# Patient Record
Sex: Female | Born: 1945
Health system: Southern US, Community
[De-identification: ages and names within clinical notes are randomized; demographics above are authoritative.]

## PROBLEM LIST (undated history)

## (undated) DIAGNOSIS — M810 Age-related osteoporosis without current pathological fracture: Secondary | ICD-10-CM

## (undated) DIAGNOSIS — Z8489 Family history of other specified conditions: Secondary | ICD-10-CM

## (undated) DIAGNOSIS — T7840XA Allergy, unspecified, initial encounter: Secondary | ICD-10-CM

## (undated) DIAGNOSIS — K219 Gastro-esophageal reflux disease without esophagitis: Secondary | ICD-10-CM

## (undated) DIAGNOSIS — G4700 Insomnia, unspecified: Secondary | ICD-10-CM

## (undated) DIAGNOSIS — G9340 Encephalopathy, unspecified: Secondary | ICD-10-CM

## (undated) DIAGNOSIS — F419 Anxiety disorder, unspecified: Secondary | ICD-10-CM

## (undated) DIAGNOSIS — D649 Anemia, unspecified: Secondary | ICD-10-CM

## (undated) DIAGNOSIS — N643 Galactorrhea not associated with childbirth: Secondary | ICD-10-CM

## (undated) DIAGNOSIS — M1990 Unspecified osteoarthritis, unspecified site: Secondary | ICD-10-CM

## (undated) DIAGNOSIS — Z5189 Encounter for other specified aftercare: Secondary | ICD-10-CM

## (undated) DIAGNOSIS — G709 Myoneural disorder, unspecified: Secondary | ICD-10-CM

## (undated) DIAGNOSIS — I10 Essential (primary) hypertension: Secondary | ICD-10-CM

## (undated) DIAGNOSIS — K635 Polyp of colon: Secondary | ICD-10-CM

## (undated) DIAGNOSIS — J40 Bronchitis, not specified as acute or chronic: Secondary | ICD-10-CM

## (undated) DIAGNOSIS — N189 Chronic kidney disease, unspecified: Secondary | ICD-10-CM

## (undated) DIAGNOSIS — R519 Headache, unspecified: Secondary | ICD-10-CM

## (undated) DIAGNOSIS — J45909 Unspecified asthma, uncomplicated: Secondary | ICD-10-CM

## (undated) DIAGNOSIS — M797 Fibromyalgia: Secondary | ICD-10-CM

## (undated) DIAGNOSIS — M719 Bursopathy, unspecified: Secondary | ICD-10-CM

## (undated) DIAGNOSIS — N6019 Diffuse cystic mastopathy of unspecified breast: Secondary | ICD-10-CM

## (undated) DIAGNOSIS — J9690 Respiratory failure, unspecified, unspecified whether with hypoxia or hypercapnia: Secondary | ICD-10-CM

## (undated) DIAGNOSIS — E785 Hyperlipidemia, unspecified: Secondary | ICD-10-CM

## (undated) DIAGNOSIS — R569 Unspecified convulsions: Secondary | ICD-10-CM

## (undated) DIAGNOSIS — B1920 Unspecified viral hepatitis C without hepatic coma: Secondary | ICD-10-CM

## (undated) DIAGNOSIS — C539 Malignant neoplasm of cervix uteri, unspecified: Secondary | ICD-10-CM

## (undated) DIAGNOSIS — B182 Chronic viral hepatitis C: Secondary | ICD-10-CM

## (undated) DIAGNOSIS — K5909 Other constipation: Secondary | ICD-10-CM

## (undated) HISTORY — DX: Malignant neoplasm of cervix uteri, unspecified: C53.9

## (undated) HISTORY — DX: Galactorrhea not associated with childbirth: N64.3

## (undated) HISTORY — DX: Diffuse cystic mastopathy of unspecified breast: N60.19

## (undated) HISTORY — DX: Chronic viral hepatitis C: B18.2

## (undated) HISTORY — DX: Polyp of colon: K63.5

## (undated) HISTORY — DX: Other constipation: K59.09

## (undated) HISTORY — DX: Gastro-esophageal reflux disease without esophagitis: K21.9

## (undated) HISTORY — DX: Respiratory failure, unspecified, unspecified whether with hypoxia or hypercapnia: J96.90

## (undated) HISTORY — PX: APPENDECTOMY: SHX54

## (undated) HISTORY — DX: Encephalopathy, unspecified: G93.40

## (undated) HISTORY — DX: Unspecified convulsions: R56.9

## (undated) HISTORY — DX: Insomnia, unspecified: G47.00

## (undated) HISTORY — DX: Hyperlipidemia, unspecified: E78.5

## (undated) HISTORY — PX: ABDOMINAL HYSTERECTOMY: SHX81

## (undated) HISTORY — PX: DILATION AND CURETTAGE OF UTERUS: SHX78

## (undated) HISTORY — DX: Encounter for other specified aftercare: Z51.89

## (undated) HISTORY — DX: Anxiety disorder, unspecified: F41.9

## (undated) HISTORY — PX: POLYPECTOMY: SHX149

## (undated) HISTORY — DX: Allergy, unspecified, initial encounter: T78.40XA

## (undated) HISTORY — DX: Age-related osteoporosis without current pathological fracture: M81.0

## (undated) HISTORY — DX: Anemia, unspecified: D64.9

## (undated) HISTORY — DX: Unspecified asthma, uncomplicated: J45.909

---

## 2011-11-19 ENCOUNTER — Encounter (HOSPITAL_COMMUNITY): Payer: Self-pay | Admitting: Emergency Medicine

## 2011-11-19 ENCOUNTER — Emergency Department (INDEPENDENT_AMBULATORY_CARE_PROVIDER_SITE_OTHER)
Admission: EM | Admit: 2011-11-19 | Discharge: 2011-11-19 | Disposition: A | Discharge status: Home / Self Care | Payer: Medicare Other | Source: Home / Self Care | Attending: Emergency Medicine | Admitting: Emergency Medicine

## 2011-11-19 DIAGNOSIS — M069 Rheumatoid arthritis, unspecified: Secondary | ICD-10-CM

## 2011-11-19 HISTORY — DX: Bursopathy, unspecified: M71.9

## 2011-11-19 HISTORY — DX: Bronchitis, not specified as acute or chronic: J40

## 2011-11-19 HISTORY — DX: Unspecified viral hepatitis C without hepatic coma: B19.20

## 2011-11-19 HISTORY — DX: Unspecified osteoarthritis, unspecified site: M19.90

## 2011-11-19 HISTORY — DX: Essential (primary) hypertension: I10

## 2011-11-19 MED ORDER — MELOXICAM 7.5 MG PO TABS: 7.5000 mg | ORAL_TABLET | Freq: Every day | ORAL | Status: DC

## 2011-11-19 MED ORDER — TRAMADOL HCL 50 MG PO TABS: 100.0000 mg | ORAL_TABLET | Freq: Three times a day (TID) | ORAL | Status: DC | PRN

## 2011-11-19 MED ORDER — METHYLPREDNISOLONE ACETATE 80 MG/ML IJ SUSP
INTRAMUSCULAR | Status: AC
  Filled 2011-11-19: qty 1

## 2011-11-19 MED ORDER — PREDNISONE 10 MG PO TABS: ORAL_TABLET | ORAL | Status: DC

## 2011-11-19 MED ORDER — METHYLPREDNISOLONE ACETATE 80 MG/ML IJ SUSP
80.0000 mg | Freq: Once | INTRAMUSCULAR | Status: AC
  Administered 2011-11-19: 80 mg via INTRAMUSCULAR

## 2011-11-19 NOTE — ED Provider Notes (Signed)
Chief Complaint  Patient presents with  . Knee Pain    History of Present Illness:   [place] is a 66 year old female with rheumatoid arthritis. She was seeing a rheumatologist in [place]. She has moved to [facility] doesn't have a local rheumatologist or primary care physician here. She had been taking Enbrel and tramadol for the pain and found that this works very well. She had had some inflammation in her wrists and MCP joints but this seemed to calm down. Her main pain is recently have been in both knees and both hips. She had a flareup of her joints about 3 days ago without any obvious precipitating factor. She denies any trauma to the areas. She has pain and swelling mostly in her left knee and also some in her right knee and both hips. She has knots surround her knee and these are red and swollen and tender. The pain is worse if she stands and walks and not really better with anything. She denies any fever or chills. No other joint pain, skin rash, or adenopathy.  Review of Systems:  Other than noted above, the patient denies any of the following symptoms: Systemic:  No fevers, chills, sweats, or aches.  No fatigue or tiredness. Musculoskeletal:  No joint pain, arthritis, bursitis, swelling, back pain, or neck pain. Neurological:  No muscular weakness, paresthesias, headache, or trouble with speech or coordination.  No dizziness.  PMFSH:  Past medical history, family history, social history, meds, and allergies were reviewed.  Physical Exam:   Vital signs:  BP 174/109  Pulse 99  Temp 98.9 F (37.2 C) (Oral)  Resp 18  SpO2 100% Gen:  Alert and oriented times 3.  In no distress. Musculoskeletal: Joint survey reveals no joint swelling, tenderness, or deformity in the upper extremities, specifically in the wrists, MCP, PIP, DIP joints. There is no pain to palpation ankles or MTP joints. There is pain to palpation over both knee joints and some synovitis in the left knee with inflamed plica  fold and tenderness to palpation over the medial and lateral joint lines. There is no fluid or joint effusion. Both knees have a full range of motion but with pain on flexion with crepitus and pain with movement. Hips have a diminished range of motion. Otherwise, all joints had a full a ROM with no swelling, bruising or deformity.  No edema, pulses full. Extremities were warm and pink.  Capillary refill was brisk.  Skin:  Clear, warm and dry.  No rash. Neuro:  Alert and oriented times 3.  Muscle strength was normal.  Sensation was intact to light touch.   Course in Urgent Care Center:   She was given Depo-Medrol 80 mg IM and tolerated this well without any immediate side effects.  Assessment:  The encounter diagnosis was Rheumatoid arthritis.  Plan:   1.  The following meds were prescribed:   New Prescriptions   MELOXICAM (MOBIC) 7.5 MG TABLET    Take 1 tablet (7.5 mg total) by mouth daily.   PREDNISONE (DELTASONE) 10 MG TABLET    Take 4 tabs daily for 4 days, 3 tabs daily for 4 days, 2 tabs daily for 4 days, then 1 tab daily for 4 days.   TRAMADOL (ULTRAM) 50 MG TABLET    Take 2 tablets (100 mg total) by mouth every 8 (eight) hours as needed for pain.   2.  The patient was instructed in symptomatic care, including rest and activity, elevation, application of ice and compression.  Appropriate handouts were given. 3.  The patient was told to return if becoming worse in any way, if no better in 3 or 4 days, and given some red flag symptoms that would indicate earlier return.   4.  The patient was told to follow up with Dr. Vicki Mallet early next week.   Reuben Likes, MD 11/19/11 2214

## 2011-11-19 NOTE — ED Notes (Signed)
Pt is c/o bilateral knee pain x1 week... It's gotten worse the past 3 days.. The pain will radiate down her legs... Left knee swollen and tender... Right knee just aching... Has been dx w/chronic arthritis and bursitis... Also c/o hip pain to the point where she can't lay down... She denies: fevers, vomiting, diarrhea, nausea....Just moved here from [place], [place] and has not been able to find a new PCP.

## 2011-12-02 ENCOUNTER — Other Ambulatory Visit: Payer: Self-pay | Admitting: Rheumatology

## 2011-12-02 ENCOUNTER — Ambulatory Visit
Admission: RE | Admit: 2011-12-02 | Discharge: 2011-12-02 | Disposition: A | Discharge status: Home / Self Care | Payer: Medicare Other | Source: Ambulatory Visit | Attending: Rheumatology | Admitting: Rheumatology

## 2011-12-02 DIAGNOSIS — M545 Low back pain, unspecified: Secondary | ICD-10-CM

## 2012-05-11 ENCOUNTER — Ambulatory Visit (INDEPENDENT_AMBULATORY_CARE_PROVIDER_SITE_OTHER): Payer: Medicare Other | Admitting: Family Medicine

## 2012-05-11 ENCOUNTER — Ambulatory Visit: Payer: Medicare Other

## 2012-05-11 VITALS — BP 195/92 | HR 105 | Temp 98.5°F | Resp 17 | Ht 64.0 in | Wt 193.0 lb

## 2012-05-11 DIAGNOSIS — R05 Cough: Secondary | ICD-10-CM

## 2012-05-11 DIAGNOSIS — I10 Essential (primary) hypertension: Secondary | ICD-10-CM

## 2012-05-11 DIAGNOSIS — Z720 Tobacco use: Secondary | ICD-10-CM

## 2012-05-11 DIAGNOSIS — R042 Hemoptysis: Secondary | ICD-10-CM

## 2012-05-11 DIAGNOSIS — F17200 Nicotine dependence, unspecified, uncomplicated: Secondary | ICD-10-CM

## 2012-05-11 DIAGNOSIS — Z1239 Encounter for other screening for malignant neoplasm of breast: Secondary | ICD-10-CM

## 2012-05-11 DIAGNOSIS — R059 Cough, unspecified: Secondary | ICD-10-CM

## 2012-05-11 DIAGNOSIS — Z1231 Encounter for screening mammogram for malignant neoplasm of breast: Secondary | ICD-10-CM

## 2012-05-11 DIAGNOSIS — R109 Unspecified abdominal pain: Secondary | ICD-10-CM

## 2012-05-11 DIAGNOSIS — R112 Nausea with vomiting, unspecified: Secondary | ICD-10-CM

## 2012-05-11 DIAGNOSIS — K219 Gastro-esophageal reflux disease without esophagitis: Secondary | ICD-10-CM

## 2012-05-11 DIAGNOSIS — B182 Chronic viral hepatitis C: Secondary | ICD-10-CM

## 2012-05-11 LAB — COMPREHENSIVE METABOLIC PANEL
ALT: 67 U/L — ABNORMAL HIGH (ref 0–35)
AST: 69 U/L — ABNORMAL HIGH (ref 0–37)
Albumin: 4.5 g/dL (ref 3.5–5.2)
Calcium: 10.3 mg/dL (ref 8.4–10.5)
Chloride: 104 mEq/L (ref 96–112)
Potassium: 3.8 mEq/L (ref 3.5–5.3)
Sodium: 139 mEq/L (ref 135–145)
Total Protein: 8.3 g/dL (ref 6.0–8.3)

## 2012-05-11 LAB — POCT URINALYSIS DIPSTICK
Bilirubin, UA: NEGATIVE
Blood, UA: NEGATIVE
Glucose, UA: NEGATIVE
Leukocytes, UA: NEGATIVE
Nitrite, UA: POSITIVE

## 2012-05-11 LAB — POCT CBC
Hemoglobin: 14.5 g/dL (ref 12.2–16.2)
Lymph, poc: 3 (ref 0.6–3.4)
MCHC: 32.7 g/dL (ref 31.8–35.4)
MPV: 11.2 fL (ref 0–99.8)
POC Granulocyte: 3.4 (ref 2–6.9)
POC MID %: 7.2 %M (ref 0–12)
RBC: 4.29 M/uL (ref 4.04–5.48)

## 2012-05-11 LAB — POCT UA - MICROSCOPIC ONLY
Mucus, UA: NEGATIVE
Yeast, UA: NEGATIVE

## 2012-05-11 MED ORDER — ALBUTEROL SULFATE HFA 108 (90 BASE) MCG/ACT IN AERS: 2.0000 | INHALATION_SPRAY | Freq: Four times a day (QID) | RESPIRATORY_TRACT | Status: DC | PRN

## 2012-05-11 MED ORDER — CLARITHROMYCIN 500 MG PO TABS: 500.0000 mg | ORAL_TABLET | Freq: Two times a day (BID) | ORAL | Status: DC

## 2012-05-11 MED ORDER — AMLODIPINE BESYLATE 5 MG PO TABS: 5.0000 mg | ORAL_TABLET | Freq: Every day | ORAL | Status: DC

## 2012-05-11 MED ORDER — LANSOPRAZOLE 30 MG PO CPDR: 30.0000 mg | DELAYED_RELEASE_CAPSULE | Freq: Every day | ORAL | Status: DC

## 2012-05-11 MED ORDER — HYDROCODONE-HOMATROPINE 5-1.5 MG/5ML PO SYRP: 5.0000 mL | ORAL_SOLUTION | ORAL | Status: DC | PRN

## 2012-05-11 NOTE — Patient Instructions (Addendum)
Lots of fluids  Get plenty of rest  Start working on deciding when you're quit date will be filled when you quit smoking.  Return in one week for recheck  Take medications as directed

## 2012-05-11 NOTE — Progress Notes (Signed)
Subjective: 67 year old lady who moved here from [place] a year ago. She does not have her regular primary care physician hearing [place], though she is to go for regular annual checkups in [place]. She has been to see [place] a few times here in [place]. Her primary concern concern today is that she's had a cough for the past 3 weeks. She had minimal upper sparked or congestion onset, but is mostly just the cough. She is continued a harsh cough that hurts her chest. It even hurts in her breasts down to the nipples. She says it hurts most on the right side. She did have a little clear drainage from her nipple, but that seems to have stopped. They have been following her breast mammograms for some concern about every 6 months. She has not had any mammograms for year. She has not had a lot of upper sparked or congestion and she does have a history of allergies. She has been having a lot of nausea. When she needs it feels like it's going to come back up. She's been coughing up a half a cup or so of mucus during the night. There is blood in it in the mornings. She says he wheezes on. No urinary complaints. She does have chronic constipation.  Since coming to [place] she has not been doing a lot. This was where she had lived when she was young, and she moved back from [place] in to get home. He  Objective: Pleasant lady appearing her stated age. Her TMs have a little wax but are normal. Her throat is clear. She has a bad gag reflex. Neck supple without nodes or thyromegaly. Chest is clear to auscultation. No rhonchi rales or wheezes were noted. Breasts large, full, have some problems nodularity but no pathologic findings. Abdomen soft nontender. Extremities without edema.  Assessment: Cough Hemoptysis Nausea Fibrocystic breasts History of galactorrhea on right Chronic constipation Arthritis and arthralgias Hypertension, poorly controlled Tobacco abuse Hx hepatitis C    Plan: Check  CBC, C. met, UA, and chest x-ray. She will need some additional blood pressure medication We'll refer for a mammogram Decide treatment of the acute problems based on the studies.  [facility] reading (PRIMARY) by  [place] Normal cxr   Treat with cough med, abx, inhaler  Get a mammogram  Add amlodipine  .smoking cessation counselling 6 min

## 2012-05-12 ENCOUNTER — Encounter: Payer: Self-pay | Admitting: Family Medicine

## 2012-05-18 ENCOUNTER — Ambulatory Visit (INDEPENDENT_AMBULATORY_CARE_PROVIDER_SITE_OTHER): Payer: Medicare Other | Admitting: Family Medicine

## 2012-05-18 VITALS — BP 140/78 | HR 93 | Temp 98.6°F | Resp 16 | Ht 64.0 in | Wt 190.0 lb

## 2012-05-18 DIAGNOSIS — R1011 Right upper quadrant pain: Secondary | ICD-10-CM

## 2012-05-18 DIAGNOSIS — R042 Hemoptysis: Secondary | ICD-10-CM

## 2012-05-18 MED ORDER — BENZONATATE 100 MG PO CAPS: ORAL_CAPSULE | ORAL | Status: DC

## 2012-05-18 MED ORDER — HYDROCODONE-HOMATROPINE 5-1.5 MG/5ML PO SYRP: 5.0000 mL | ORAL_SOLUTION | ORAL | Status: DC | PRN

## 2012-05-18 NOTE — Progress Notes (Signed)
Subjective: 67 year old lady who is here for followup with going to the persistent cough. She is doing better. She still is coughing a lot, bothering her rest at night. She continues to have pain in the right upper quadrant abdomen and around to the right flank. Her labs from last week showed some mild elevation of the ALT and AST consistent with her hepatitis C. She coughs so much she vomited and brought up a big slab of phlegm. She's not eat well with not feeling well.  Objective: Pleasant alert lady does not look terribly ill today, but she is coughing a lot. Her chest is clear to auscultation. Heart regular without murmurs. Abdomen soft without mass or specific tenderness. Does have right flank tenderness going around to the side of her abdomen. Apparently she was having pains in this region before she got this cough, though the cough is aggravated things.  Assessment: Bronchitis, slowly resolving Hemoptysis, improving Right upper quadrant and right flank pain History of hepatitis C  Plan: Will go ahead and get a CT scan of the abdomen because of the persistent right quadrant and right flank pains. I suspect the liver enzyme elevation is from hepatitis C, but need to be certain of that.  Continue the antibiotics. Refill cough medications. Give her some Tessalon also. She is trying to get her records for [place] from [facility].

## 2012-05-18 NOTE — Patient Instructions (Addendum)
Continue drinking plenty of fluids and taking out the rest of the antibiotic course.  We will schedule CT scan of the abdomen for about 2 weeks from now.

## 2012-05-25 ENCOUNTER — Other Ambulatory Visit: Payer: Self-pay | Admitting: Family Medicine

## 2012-05-25 ENCOUNTER — Ambulatory Visit
Admission: RE | Admit: 2012-05-25 | Discharge: 2012-05-25 | Disposition: A | Discharge status: Home / Self Care | Payer: Medicare Other | Source: Ambulatory Visit | Attending: Family Medicine | Admitting: Family Medicine

## 2012-05-25 DIAGNOSIS — R1011 Right upper quadrant pain: Secondary | ICD-10-CM

## 2012-05-25 MED ORDER — IOHEXOL 300 MG/ML  SOLN
100.0000 mL | Freq: Once | INTRAMUSCULAR | Status: AC | PRN
  Administered 2012-05-25: 100 mL via INTRAVENOUS

## 2012-06-08 ENCOUNTER — Telehealth: Payer: Self-pay

## 2012-06-08 ENCOUNTER — Other Ambulatory Visit: Payer: Self-pay

## 2012-06-08 DIAGNOSIS — N6310 Unspecified lump in the right breast, unspecified quadrant: Secondary | ICD-10-CM

## 2012-06-08 NOTE — Telephone Encounter (Signed)
JENNIFER FROM [facility] IS CALLING REGARDING PATIENT RECORDS. THEY ARE TRYING TO OBTAIN SOME IMAGES THAT WERE DONE OUT OF STATE AT [facility]. THEY WANT TO KNOW IF OUR MEDICAL RECORDS DEPARTMENT CAN FAX A RELEASE TO [facility] IN ORDER TO GET THESE IMAGES. THE FAX NUMBER FOR THEM IS [phone]. PHONE NUMBER IS [phone]. PHONE NUMBER FOR [facility] IS [phone].

## 2012-06-08 NOTE — Telephone Encounter (Signed)
Spoke with [employer]. [facility] needs to send the request to have the records sent directly to their office. She understood and will notify patient.

## 2012-06-15 ENCOUNTER — Ambulatory Visit: Payer: Medicare Other

## 2012-06-29 ENCOUNTER — Ambulatory Visit
Admission: RE | Admit: 2012-06-29 | Discharge: 2012-06-29 | Disposition: A | Discharge status: Home / Self Care | Payer: Medicare Other | Source: Ambulatory Visit | Attending: Family Medicine | Admitting: Family Medicine

## 2012-06-29 DIAGNOSIS — N6310 Unspecified lump in the right breast, unspecified quadrant: Secondary | ICD-10-CM

## 2013-04-11 DIAGNOSIS — B182 Chronic viral hepatitis C: Secondary | ICD-10-CM | POA: Diagnosis not present

## 2013-04-23 ENCOUNTER — Other Ambulatory Visit: Payer: Self-pay | Admitting: Internal Medicine

## 2013-04-23 ENCOUNTER — Other Ambulatory Visit: Payer: Self-pay | Admitting: *Deleted

## 2013-04-23 DIAGNOSIS — B182 Chronic viral hepatitis C: Secondary | ICD-10-CM

## 2013-04-29 ENCOUNTER — Other Ambulatory Visit: Payer: Self-pay | Admitting: Family Medicine

## 2013-05-03 DIAGNOSIS — B182 Chronic viral hepatitis C: Secondary | ICD-10-CM | POA: Diagnosis not present

## 2013-05-03 DIAGNOSIS — R05 Cough: Secondary | ICD-10-CM | POA: Diagnosis not present

## 2013-05-03 DIAGNOSIS — J069 Acute upper respiratory infection, unspecified: Secondary | ICD-10-CM | POA: Diagnosis not present

## 2013-05-03 DIAGNOSIS — B1710 Acute hepatitis C without hepatic coma: Secondary | ICD-10-CM | POA: Diagnosis not present

## 2013-05-03 DIAGNOSIS — R059 Cough, unspecified: Secondary | ICD-10-CM | POA: Diagnosis not present

## 2013-05-18 ENCOUNTER — Ambulatory Visit
Admission: RE | Admit: 2013-05-18 | Discharge: 2013-05-18 | Disposition: A | Discharge status: Home / Self Care | Payer: Medicare Other | Source: Ambulatory Visit | Attending: Internal Medicine | Admitting: Internal Medicine

## 2013-05-18 DIAGNOSIS — B182 Chronic viral hepatitis C: Secondary | ICD-10-CM | POA: Diagnosis not present

## 2013-07-08 ENCOUNTER — Other Ambulatory Visit: Payer: Self-pay | Admitting: Physician Assistant

## 2013-07-30 ENCOUNTER — Other Ambulatory Visit: Payer: Self-pay | Admitting: Physician Assistant

## 2013-11-19 DIAGNOSIS — M549 Dorsalgia, unspecified: Secondary | ICD-10-CM | POA: Diagnosis not present

## 2013-11-19 DIAGNOSIS — G4700 Insomnia, unspecified: Secondary | ICD-10-CM | POA: Diagnosis not present

## 2013-11-19 DIAGNOSIS — R6889 Other general symptoms and signs: Secondary | ICD-10-CM | POA: Diagnosis not present

## 2013-11-19 DIAGNOSIS — M2550 Pain in unspecified joint: Secondary | ICD-10-CM | POA: Diagnosis not present

## 2014-05-20 DIAGNOSIS — M25562 Pain in left knee: Secondary | ICD-10-CM | POA: Diagnosis not present

## 2014-05-20 DIAGNOSIS — M545 Low back pain: Secondary | ICD-10-CM | POA: Diagnosis not present

## 2014-05-20 DIAGNOSIS — I10 Essential (primary) hypertension: Secondary | ICD-10-CM | POA: Diagnosis not present

## 2014-05-20 DIAGNOSIS — M797 Fibromyalgia: Secondary | ICD-10-CM | POA: Diagnosis not present

## 2014-10-17 ENCOUNTER — Ambulatory Visit (INDEPENDENT_AMBULATORY_CARE_PROVIDER_SITE_OTHER): Payer: Medicare Other | Admitting: Physician Assistant

## 2014-10-17 ENCOUNTER — Other Ambulatory Visit: Payer: Self-pay

## 2014-10-17 ENCOUNTER — Encounter: Payer: Self-pay | Admitting: Physician Assistant

## 2014-10-17 VITALS — BP 180/100 | HR 93 | Temp 98.5°F | Resp 16 | Ht 63.75 in | Wt 188.6 lb

## 2014-10-17 DIAGNOSIS — Z1231 Encounter for screening mammogram for malignant neoplasm of breast: Secondary | ICD-10-CM | POA: Diagnosis not present

## 2014-10-17 DIAGNOSIS — K219 Gastro-esophageal reflux disease without esophagitis: Secondary | ICD-10-CM

## 2014-10-17 DIAGNOSIS — Z780 Asymptomatic menopausal state: Secondary | ICD-10-CM | POA: Diagnosis not present

## 2014-10-17 DIAGNOSIS — M159 Polyosteoarthritis, unspecified: Secondary | ICD-10-CM | POA: Insufficient documentation

## 2014-10-17 DIAGNOSIS — K5909 Other constipation: Secondary | ICD-10-CM | POA: Insufficient documentation

## 2014-10-17 DIAGNOSIS — Z720 Tobacco use: Secondary | ICD-10-CM | POA: Diagnosis not present

## 2014-10-17 DIAGNOSIS — Z789 Other specified health status: Secondary | ICD-10-CM | POA: Diagnosis not present

## 2014-10-17 DIAGNOSIS — N6019 Diffuse cystic mastopathy of unspecified breast: Secondary | ICD-10-CM | POA: Insufficient documentation

## 2014-10-17 DIAGNOSIS — Z1211 Encounter for screening for malignant neoplasm of colon: Secondary | ICD-10-CM | POA: Diagnosis not present

## 2014-10-17 DIAGNOSIS — Z0000 Encounter for general adult medical examination without abnormal findings: Secondary | ICD-10-CM

## 2014-10-17 DIAGNOSIS — G4700 Insomnia, unspecified: Secondary | ICD-10-CM

## 2014-10-17 DIAGNOSIS — F17200 Nicotine dependence, unspecified, uncomplicated: Secondary | ICD-10-CM

## 2014-10-17 DIAGNOSIS — I10 Essential (primary) hypertension: Secondary | ICD-10-CM

## 2014-10-17 DIAGNOSIS — M069 Rheumatoid arthritis, unspecified: Secondary | ICD-10-CM

## 2014-10-17 DIAGNOSIS — B182 Chronic viral hepatitis C: Secondary | ICD-10-CM

## 2014-10-17 DIAGNOSIS — Z23 Encounter for immunization: Secondary | ICD-10-CM | POA: Diagnosis not present

## 2014-10-17 DIAGNOSIS — R42 Dizziness and giddiness: Secondary | ICD-10-CM

## 2014-10-17 HISTORY — DX: Chronic viral hepatitis C: B18.2

## 2014-10-17 LAB — CBC WITH DIFFERENTIAL/PLATELET
BASOS ABS: 0 10*3/uL (ref 0.0–0.1)
Basophils Relative: 1 % (ref 0–1)
Eosinophils Absolute: 0 10*3/uL (ref 0.0–0.7)
Eosinophils Relative: 1 % (ref 0–5)
HEMATOCRIT: 45.5 % (ref 36.0–46.0)
HEMOGLOBIN: 16 g/dL — AB (ref 12.0–15.0)
LYMPHS ABS: 2.2 10*3/uL (ref 0.7–4.0)
LYMPHS PCT: 44 % (ref 12–46)
MCH: 34.6 pg — ABNORMAL HIGH (ref 26.0–34.0)
MCHC: 35.2 g/dL (ref 30.0–36.0)
MCV: 98.5 fL (ref 78.0–100.0)
MPV: 11.3 fL (ref 8.6–12.4)
Monocytes Absolute: 0.3 10*3/uL (ref 0.1–1.0)
Monocytes Relative: 7 % (ref 3–12)
NEUTROS PCT: 47 % (ref 43–77)
Neutro Abs: 2.3 10*3/uL (ref 1.7–7.7)
Platelets: 254 10*3/uL (ref 150–400)
RBC: 4.62 MIL/uL (ref 3.87–5.11)
RDW: 12.8 % (ref 11.5–15.5)
WBC: 4.9 10*3/uL (ref 4.0–10.5)

## 2014-10-17 LAB — COMPREHENSIVE METABOLIC PANEL
ALBUMIN: 4.7 g/dL (ref 3.6–5.1)
ALK PHOS: 82 U/L (ref 33–130)
ALT: 59 U/L — AB (ref 6–29)
AST: 67 U/L — ABNORMAL HIGH (ref 10–35)
BUN: 13 mg/dL (ref 7–25)
CALCIUM: 10.3 mg/dL (ref 8.6–10.4)
CO2: 22 mmol/L (ref 20–31)
Chloride: 99 mmol/L (ref 98–110)
Creat: 0.78 mg/dL (ref 0.50–0.99)
Glucose, Bld: 101 mg/dL — ABNORMAL HIGH (ref 65–99)
POTASSIUM: 3.3 mmol/L — AB (ref 3.5–5.3)
Sodium: 135 mmol/L (ref 135–146)
TOTAL PROTEIN: 8.8 g/dL — AB (ref 6.1–8.1)
Total Bilirubin: 0.7 mg/dL (ref 0.2–1.2)

## 2014-10-17 LAB — LIPID PANEL
CHOL/HDL RATIO: 2.4 ratio (ref <=5.0)
CHOLESTEROL: 281 mg/dL — AB (ref 125–200)
HDL: 118 mg/dL (ref >=46)
LDL Cholesterol: 140 mg/dL — ABNORMAL HIGH (ref <130)
Triglycerides: 117 mg/dL (ref <150)
VLDL: 23 mg/dL (ref <30)

## 2014-10-17 LAB — TSH: TSH: 1.402 u[IU]/mL (ref 0.350–4.500)

## 2014-10-17 MED ORDER — LANSOPRAZOLE 30 MG PO TBDP: 30.0000 mg | ORAL_TABLET | Freq: Every day | ORAL | Status: DC

## 2014-10-17 MED ORDER — LOSARTAN POTASSIUM 50 MG PO TABS: 50.0000 mg | ORAL_TABLET | Freq: Every day | ORAL | Status: DC

## 2014-10-17 MED ORDER — LANSOPRAZOLE 30 MG PO CPDR: DELAYED_RELEASE_CAPSULE | ORAL | Status: DC

## 2014-10-17 MED ORDER — ZOSTER VACCINE LIVE 19400 UNT/0.65ML ~~LOC~~ SOLR: 0.6500 mL | Freq: Once | SUBCUTANEOUS | Status: DC

## 2014-10-17 NOTE — Telephone Encounter (Signed)
Pharm called and reported that the prevacid caps are not covered either. Rejection note is saying pt needs to have tried/failed omeprazole and/or another covered med (which they don't list, but probably pantoprazole). Do you want to Rx something else Chelle?

## 2014-10-17 NOTE — Telephone Encounter (Signed)
Spoke to pt. Pt is okay with getting the handicap placard from her rheumatologist.  She also would like the prevacid capsule sent in instead of the disintegrating tablet. Her insurance does not cover the disintegrating tablet and it would cost her $1,000.  Okay to send in Prevacid capsule? I will pend the capsule.

## 2014-10-17 NOTE — Progress Notes (Signed)
  Medical screening examination/treatment/procedure(s) were performed by non-physician practitioner and as supervising physician I was immediately available for consultation/collaboration.     

## 2014-10-17 NOTE — Telephone Encounter (Signed)
Ms. Hautala came in to ask if she could have a new handicap placard. She would like the paperwork completed to take into the [facility] please. Call her to let her know when the papers are ready.

## 2014-10-17 NOTE — Patient Instructions (Addendum)
I will contact you with your lab results as soon as they are available.   If you have not heard from me in 2 weeks, please contact me.  The fastest way to get your results is to register for My Chart (see the instructions on the last page of this printout).  Find out from Dr. Charlestine Night which pneumococcal vaccine you received and when you received it.   We recommend that you schedule a mammogram for breast cancer screening. Typically, you do not need a referral to do this. Please contact a local imaging center to schedule your mammogram.  [facility] - [phone]  *ask for the Radiology Department [facility] - [phone] or [phone]  [facility] - [phone] [place] [phone] [facility] - [phone]  *ask for the [facility] - [phone]  *ask for the Radiology Department [facility] - [phone]  *ask for the [facility] - [phone]  Keeping You Healthy  Get These Tests  Blood Pressure- Have your blood pressure checked by your healthcare provider at least once a year.  Normal blood pressure is 120/80.  Weight- Have your body mass index (BMI) calculated to screen for obesity.  BMI is a measure of body fat based on height and weight.  You can calculate your own BMI at [employer].it  Cholesterol- Have your cholesterol checked every year.  Diabetes- Have your blood sugar checked every year if you have high blood pressure, high cholesterol, a family history of diabetes or if you are overweight.  Pap Test - Have a pap test every 1 to 5 years if you have been sexually active.  If you are older than 65 and recent pap tests have been normal you may not need additional pap tests.  In addition, if you have had a hysterectomy  for benign disease additional pap tests are not necessary.  Mammogram-Yearly  mammograms are essential for early detection of breast cancer  Screening for Colon Cancer- Colonoscopy starting at age 53. Screening may begin sooner depending on your family history and other health conditions.  Follow up colonoscopy as directed by your Gastroenterologist.  Screening for Osteoporosis- Screening begins at age 43 with bone density scanning, sooner if you are at higher risk for developing Osteoporosis.  Get these medicines  Calcium with Vitamin D- Your body requires 1200-1500 mg of Calcium a day and [phone] IU of Vitamin D a day.  You can only absorb 500 mg of Calcium at a time therefore Calcium must be taken in 2 or 3 separate doses throughout the day.  Hormones- Hormone therapy has been associated with increased risk for certain cancers and heart disease.  Talk to your healthcare provider about if you need relief from menopausal symptoms.  Aspirin- Ask your healthcare provider about taking Aspirin to prevent Heart Disease and Stroke.  Get these Immuniztions  Flu shot- Every fall  Pneumonia shot- Once after the age of 66; if you are younger ask your healthcare provider if you need a pneumonia shot.  Tetanus- Every ten years.  Zostavax- Once after the age of 56 to prevent shingles.  Take these steps  Don't smoke- Your healthcare provider can help you quit. For tips on how to quit, ask your healthcare provider or go to www.smokefree.gov or call 1-800 QUIT-NOW.  Be physically active- Exercise 5 days a week for a minimum of 30 minutes.  If  you are not already physically active, start slow and gradually work up to 30 minutes of moderate physical activity.  Try walking, dancing, bike riding, swimming, etc.  Eat a healthy diet- Eat a variety of healthy foods such as fruits, vegetables, whole grains, low fat milk, low fat cheeses, yogurt, lean meats, chicken, fish, eggs, dried beans, tofu, etc.  For more information go to www.thenutritionsource.org  Dental visit- Brush and  floss teeth twice daily; visit your dentist twice a year.  Eye exam- Visit your Optometrist or Ophthalmologist yearly.  Drink alcohol in moderation- Limit alcohol intake to one drink or less a day.  Never drink and drive.  Depression- Your emotional health is as important as your physical health.  If you're feeling down or losing interest in things you normally enjoy, please talk to your healthcare provider.  Seat Belts- can save your life; always wear one  Smoke/Carbon Monoxide detectors- These detectors need to be installed on the appropriate level of your home.  Replace batteries at least once a year.  Violence- If anyone is threatening or hurting you, please tell your healthcare provider.  Living Will/ Health care power of attorney- Discuss with your healthcare provider and family.

## 2014-10-17 NOTE — Progress Notes (Signed)
Subjective:   Patient ID: Heather Horton, female     DOB: [birth year], 69 y.o.    MRN: [id number]  PCP: No primary care provider on file.  Chief Complaint  Patient presents with  . Annual Exam  . Hypertension    HPI  Presents for Annual Exam and evaluation of hypertension.  She moved here from [place], [place] about 3 years ago and has not yet established for primary care. HTN was previously treated with LosartanHCT and amlodipine, but she stopped it some time ago. She states that her BP was always well controlled on her meds.  Her RA is managed by Dr. Charlestine Night, and at her last visit he prescribed a 18-month supply of Losartan-HCTZ 50/12.5 until she could establish here. She took it only briefly due to dizziness.  She is accompanied today by her daughter.  Complaints today: 1. Dizziness. "I can get up and all of a sudden I'm woozy. I can walk toward my kitchen and I get dizzy. It scares me because I home alone. It lasts for a few minutes. I go an sit down until it's gone, and then I try again. I had a sister who had a massive stroke" Stanton Kidney, 29)  2. Insomnia "I can't sleep. I feel extremely tired." HA. Can't take naps, even when she tries. Sometimes doesn't sleep all night.  She denies chest pain, shortness of breath, falls. No extremity weakness, tingling, slurred speech or confusion.  Prior to Admission medications   Medication Sig Start Date End Date Taking? Authorizing Provider  albuterol (PROVENTIL HFA;VENTOLIN HFA) 108 (90 BASE) MCG/ACT inhaler Inhale 2 puffs into the lungs every 6 (six) hours as needed for wheezing. 05/11/12  Yes Posey Boyer, MD  fexofenadine (ALLEGRA) 180 MG tablet Take 180 mg by mouth daily.   Yes Historical Provider, MD  Multiple Vitamins-Minerals (MULTIVITAMIN WITH MINERALS) tablet Take 1 tablet by mouth daily.   Yes Historical Provider, MD  tiZANidine (ZANAFLEX) 4 MG capsule Take 8 mg by mouth once. Takes 2 at bedtime   Yes Historical Provider, MD    traMADol (ULTRAM) 50 MG tablet Take 50 mg by mouth 2 (two) times daily. Take two tablets by mouth twice a day as needed   Yes Historical Provider, MD     Allergies  Allergen Reactions  . Aspirin Nausea And Vomiting and Rash     Patient Active Problem List   Diagnosis Date Noted  . Rheumatoid arthritis 10/17/2014  . Esophageal reflux 10/17/2014  . Hepatitis C 10/17/2014  . Benign essential HTN 10/17/2014  . Smoker 10/17/2014  . Immune to hepatitis B 10/17/2014  . Chronic constipation   . Fibrocystic breast disease      Family History  Problem Relation Age of Onset  . Asthma Mother   . Cancer Sister 81    colon cancer     Social History   Social History  . Marital Status: Single    Spouse Name: N/A  . Number of Children: N/A  . Years of Education: N/A   Occupational History  . Not on file.   Social History Main Topics  . Smoking status: Current Every Day Smoker -- 1.00 packs/day    Types: Cigarettes  . Smokeless tobacco: Never Used  . Alcohol Use: Yes  . Drug Use: No  . Sexual Activity: Not on file   Other Topics Concern  . Not on file   Social History Narrative        Review of Systems  Constitutional:  Negative.   HENT: Positive for dental problem, postnasal drip, rhinorrhea, sinus pressure, sneezing and trouble swallowing (reflux symptoms since she ran out of lansoprazole 30 mg)). Negative for congestion, drooling, ear discharge, ear pain, facial swelling, hearing loss, mouth sores, nosebleeds, sore throat, tinnitus and voice change.   Eyes: Positive for redness, itching and visual disturbance. Negative for photophobia, pain and discharge.  Respiratory: Positive for cough and shortness of breath. Negative for apnea, choking, chest tightness, wheezing and stridor.   Cardiovascular: Negative.   Gastrointestinal: Positive for constipation. Negative for nausea, vomiting, abdominal pain, diarrhea, blood in stool, abdominal distention, anal bleeding and  rectal pain.  Genitourinary: Negative.   Musculoskeletal: Positive for back pain, joint swelling, arthralgias, neck pain and neck stiffness. Negative for myalgias and gait problem.  Skin: Negative.   Neurological: Positive for dizziness and headaches. Negative for tremors, seizures, syncope, facial asymmetry, speech difficulty, weakness, light-headedness and numbness.  Hematological: Negative.   Psychiatric/Behavioral: Positive for sleep disturbance. Negative for suicidal ideas, hallucinations, behavioral problems, confusion, self-injury, dysphoric mood, decreased concentration and agitation. The patient is not nervous/anxious and is not hyperactive.          Objective:  Physical Exam  Constitutional: She is oriented to person, place, and time. Vital signs are normal. She appears well-developed and well-nourished. She is active and cooperative. No distress.  BP 180/100 mmHg  Pulse 93  Temp(Src) 98.5 F (36.9 C) (Oral)  Resp 16  Ht 5' 3.75" (1.619 m)  Wt 188 lb 9.6 oz (85.548 kg)  BMI 32.64 kg/m2  SpO2 100%   HENT:  Head: Normocephalic and atraumatic.  Right Ear: Hearing, tympanic membrane, external ear and ear canal normal. No foreign bodies.  Left Ear: Hearing, tympanic membrane, external ear and ear canal normal. No foreign bodies.  Nose: Nose normal.  Mouth/Throat: Uvula is midline, oropharynx is clear and moist and mucous membranes are normal. She does not have dentures. No oral lesions. Abnormal dentition. Dental caries present. No dental abscesses or uvula swelling. No oropharyngeal exudate.  Wearing a wig.  Eyes: Conjunctivae, EOM and lids are normal. Pupils are equal, round, and reactive to light. Right eye exhibits no discharge. Left eye exhibits no discharge. No scleral icterus.  Fundoscopic exam:      The right eye shows no arteriolar narrowing, no AV nicking, no exudate, no hemorrhage and no papilledema. The right eye shows red reflex.       The left eye shows no  arteriolar narrowing, no AV nicking, no exudate, no hemorrhage and no papilledema. The left eye shows red reflex.  Neck: Trachea normal, normal range of motion and full passive range of motion without pain. Neck supple. No spinous process tenderness and no muscular tenderness present. No thyroid mass and no thyromegaly present.  Cardiovascular: Normal rate, regular rhythm, normal heart sounds, intact distal pulses and normal pulses.   Pulmonary/Chest: Effort normal and breath sounds normal. Right breast exhibits no inverted nipple, no mass, no nipple discharge, no skin change and no tenderness. Left breast exhibits no inverted nipple, no mass, no nipple discharge, no skin change and no tenderness. Breasts are symmetrical.  Abdominal: Soft. Normal appearance and bowel sounds are normal. There is no hepatosplenomegaly. There is no tenderness. There is no CVA tenderness. No hernia.  Genitourinary: No breast swelling, tenderness, discharge or bleeding.  Musculoskeletal: She exhibits no edema or tenderness.       Cervical back: Normal.       Thoracic back: Normal.  Lumbar back: Normal.  Lymphadenopathy:       Head (right side): No tonsillar, no preauricular, no posterior auricular and no occipital adenopathy present.       Head (left side): No tonsillar, no preauricular, no posterior auricular and no occipital adenopathy present.    She has no cervical adenopathy.       Right: No inguinal and no supraclavicular adenopathy present.       Left: No inguinal and no supraclavicular adenopathy present.  Neurological: She is alert and oriented to person, place, and time. She has normal strength and normal reflexes. No cranial nerve deficit or sensory deficit. She exhibits normal muscle tone. Coordination and gait normal.  Skin: Skin is warm, dry and intact. No rash noted. She is not diaphoretic. No cyanosis or erythema. Nails show no clubbing.  Psychiatric: She has a normal mood and affect. Her speech is  normal and behavior is normal. Judgment and thought content normal.    EKG reviewed with Dr. Ouida Sills. NSR. No ischemia or dysrhythmia.          Assessment & Plan:  1. Annual physical exam 2. Encounter for Medicare annual wellness exam Hysterectomy, so not a candidate for cervical cancer screening. Information provided for breast cancer screening by mammogram. Also information regarding advanced directives.  3. Benign essential HTN Uncontrolled. Normal EKG. Await labs. Start losartan 50 mg daily. If she tolerates it, we can increase the dose. Consider the addition of amlodipine. If she has increased dizziness with losartan alone, she may stop it and contact me so that another agent can be started. - CBC with Differential/Platelet - Comprehensive metabolic panel - Lipid panel - TSH - EKG 12-Lead - losartan (COZAAR) 50 MG tablet; Take 1 tablet (50 mg total) by mouth daily.  Dispense: 90 tablet; Refill: 3  4. Rheumatoid arthritis Continue follow-up with Dr. Charlestine Night.  5. Gastroesophageal reflux disease, esophagitis presence not specified Restart lansoprazole. - lansoprazole (PREVACID SOLUTAB) 30 MG disintegrating tablet; Take 1 tablet (30 mg total) by mouth daily.  Dispense: 90 tablet; Refill: 3  6. Chronic hepatitis C without hepatic coma Previously seen at [facility]. Did not proceed with treatment due to cost, but it sounds like she is a candidate.    7. Smoker Encouraged smoking cessation. She will try the OTC patches.  8. Immune to hepatitis B  9. Encounter for screening mammogram for breast cancer Info provided so she can call to schedule.  10. Colon cancer screening - Ambulatory referral to Gastroenterology  11. Postmenopausal She reports a DEXA was normal in the past several years. Will try to locate the report.  12. Need for pneumococcal vaccination Had a dose with Dr. Tana Coast, but isn't sure which or when. She'll find out when she sees him next month and  I'll give the other.  13. Need for shingles vaccine - zoster vaccine live, PF, (ZOSTAVAX) 40981 UNT/0.65ML injection; Inject 19,400 Units into the skin once.  Dispense: 1 each; Refill: 0  14. Need for Tdap vaccination Isn't sure when her last dose was. Not covered by Medicare. Declines for now.  15. Need for hepatitis A vaccination Declines today. Will plan to administer at a follow-up visit.  16. Dizziness and giddiness Expect improvement with getting HTN under control. If not, plan referral to neurology.  17. Insomnia Await lab results. If no etiology identified, consider hydroxyzine prn.   Return in about 1 month (around 11/17/2014) for re-evaluation of blood pressure.    Fara Chute,  PA-C Physician Assistant-Certified [facility]

## 2014-10-18 MED ORDER — OMEPRAZOLE 40 MG PO CPDR: 40.0000 mg | DELAYED_RELEASE_CAPSULE | Freq: Every day | ORAL | Status: DC

## 2014-10-18 NOTE — Addendum Note (Signed)
Addended by: Fara Chute on: 10/18/2014 10:04 AM   Modules accepted: Orders, Medications

## 2014-10-18 NOTE — Telephone Encounter (Signed)
Called the pharmacy and they have the omeprazole ready for the pt to pick up.  i have called the patient and she is aware of this. Nothing further is needed.

## 2014-10-18 NOTE — Telephone Encounter (Signed)
Meds ordered this encounter  Medications  . DISCONTD: lansoprazole (PREVACID) 30 MG capsule    Sig: Take 1 capsule by mouth daily    Dispense:  30 capsule    Refill:  3  . omeprazole (PRILOSEC) 40 MG capsule    Sig: Take 1 capsule (40 mg total) by mouth daily.    Dispense:  90 capsule    Refill:  3    Order Specific Question:  Supervising Provider    Answer:  DOOLITTLE, ROBERT P [[id number]]

## 2014-10-20 ENCOUNTER — Encounter: Payer: Self-pay | Admitting: Family Medicine

## 2014-10-22 ENCOUNTER — Telehealth: Payer: Self-pay

## 2014-10-22 MED ORDER — TRAZODONE HCL 50 MG PO TABS: 25.0000 mg | ORAL_TABLET | Freq: Every evening | ORAL | Status: DC | PRN

## 2014-10-22 NOTE — Telephone Encounter (Signed)
Meds ordered this encounter  Medications  . traZODone (DESYREL) 50 MG tablet    Sig: Take 0.5-1 tablets (25-50 mg total) by mouth at bedtime as needed for sleep.    Dispense:  30 tablet    Refill:  3    Order Specific Question:  Supervising Provider    Answer:  DOOLITTLE, ROBERT P [[id number]]

## 2014-10-22 NOTE — Telephone Encounter (Addendum)
Chelle,   Patient needs something to sleep.  [facility] [address].   Pt would like a call back.     [phone]

## 2014-10-22 NOTE — Telephone Encounter (Signed)
Please advise 

## 2014-10-24 NOTE — Telephone Encounter (Signed)
Spoke pt, advised Rx sent in and to call and give status update.

## 2014-11-13 ENCOUNTER — Encounter: Payer: Self-pay | Admitting: Gastroenterology

## 2014-11-13 DIAGNOSIS — M25569 Pain in unspecified knee: Secondary | ICD-10-CM | POA: Diagnosis not present

## 2014-11-13 DIAGNOSIS — I10 Essential (primary) hypertension: Secondary | ICD-10-CM | POA: Diagnosis not present

## 2014-11-13 DIAGNOSIS — M542 Cervicalgia: Secondary | ICD-10-CM | POA: Diagnosis not present

## 2014-11-13 DIAGNOSIS — M797 Fibromyalgia: Secondary | ICD-10-CM | POA: Diagnosis not present

## 2014-11-19 ENCOUNTER — Ambulatory Visit (INDEPENDENT_AMBULATORY_CARE_PROVIDER_SITE_OTHER): Payer: Medicare Other | Admitting: Physician Assistant

## 2014-11-19 ENCOUNTER — Encounter: Payer: Self-pay | Admitting: Physician Assistant

## 2014-11-19 VITALS — BP 154/92 | HR 86 | Temp 98.2°F | Resp 16 | Ht 64.0 in | Wt 188.6 lb

## 2014-11-19 DIAGNOSIS — G4700 Insomnia, unspecified: Secondary | ICD-10-CM | POA: Diagnosis not present

## 2014-11-19 DIAGNOSIS — I10 Essential (primary) hypertension: Secondary | ICD-10-CM | POA: Diagnosis not present

## 2014-11-19 DIAGNOSIS — Z23 Encounter for immunization: Secondary | ICD-10-CM

## 2014-11-19 MED ORDER — TRAZODONE HCL 100 MG PO TABS: 100.0000 mg | ORAL_TABLET | Freq: Every evening | ORAL | Status: DC | PRN

## 2014-11-19 NOTE — Patient Instructions (Signed)
Take BOTH the Losartan-HCTZ 50-12.5 AND the losartan potassium 50 mg, together, one time each day.  Try the increased dose of the trazodone for sleep.

## 2014-11-19 NOTE — Progress Notes (Signed)
Patient ID: Heather Horton, female    DOB: 1945/07/18, 69 y.o.   MRN: [id number]  PCP: No primary care Uri[facility]ba on file.  Subjective:   Chief Complaint  Patient presents with  . Hypertension    HPI Presents for evaluation of HTN, which remains uncontrolled.  At her last visit one month ago, we tried just losartan (eliminating the HCTZ) due to dizziness that she was relating tot he HCTZ component.  Last week at a visit with Dr. Josem Kaufmann office, her pressure was elevated and he recommended retrying the losartanHCTZ combination.  She hasn't noticed a difference in the several days since restarting it.    Previously she was on DiovanHCT 160/12.5, which she believes worked well and she certainly tolerated it well. We believe she was switched to losartanHCTZ due to cost.    Trazodone 50 mg isn't helping her stay asleep. No problems falling asleep, but awakens at 4 am. Feels tired all the time, "even when I'm taking a shower."  Review of Systems  Constitutional: Positive for fatigue. Negative for fever and chills.  Eyes: Negative for visual disturbance.  Respiratory: Negative for cough and shortness of breath.   Cardiovascular: Negative for chest pain and palpitations.  Musculoskeletal: Positive for myalgias and arthralgias.  Neurological: Negative for weakness and headaches.  Psychiatric/Behavioral: Positive for sleep disturbance. Negative for confusion, dysphoric mood and decreased concentration. The patient is not nervous/anxious.        Patient Active Problem List   Diagnosis Date Noted  . Rheumatoid arthritis 10/17/2014  . Esophageal reflux 10/17/2014  . Hepatitis C 10/17/2014  . Benign essential HTN 10/17/2014  . Smoker 10/17/2014  . Immune to hepatitis B 10/17/2014  . Need for hepatitis A vaccination 10/17/2014  . Chronic constipation   . Fibrocystic breast disease      Prior to Admission medications   Medication Sig Start Date End Date Taking? Authorizing Weber Monnier    albuterol (PROVENTIL HFA;VENTOLIN HFA) 108 (90 BASE) MCG/ACT inhaler Inhale 2 puffs into the lungs every 6 (six) hours as needed for wheezing. 05/11/12  Yes Posey Boyer, MD  fexofenadine (ALLEGRA) 180 MG tablet Take 180 mg by mouth daily.   Yes Historical Merle Cirelli, MD  losartan (COZAAR) 50 MG tablet Take 1 tablet (50 mg total) by mouth daily. 10/17/14  Yes Chelle Jeffery, PA-C  losartan-hydrochlorothiazide (HYZAAR) 50-12.5 MG per tablet Take 1 tablet by mouth daily.   Yes Historical Rock Sobol, MD  Multiple Vitamins-Minerals (MULTIVITAMIN WITH MINERALS) tablet Take 1 tablet by mouth daily.   Yes Historical Trinisha Paget, MD  omeprazole (PRILOSEC) 40 MG capsule Take 1 capsule (40 mg total) by mouth daily. 10/18/14  Yes Chelle Jeffery, PA-C  tiZANidine (ZANAFLEX) 4 MG capsule Take 8 mg by mouth once. Takes 2 at bedtime   Yes Historical Hesston Hitchens, MD  traMADol (ULTRAM) 50 MG tablet Take 50 mg by mouth 2 (two) times daily. Take two tablets by mouth twice a day as needed   Yes Historical Leonda Cristo, MD  traZODone (DESYREL) 50 MG tablet Take 0.5-1 tablets (25-50 mg total) by mouth at bedtime as needed for sleep. 10/22/14  Yes Chelle Jeffery, PA-C  zoster vaccine live, PF, (ZOSTAVAX) 37048 UNT/0.65ML injection Inject 19,400 Units into the skin once. 10/17/14   [name], PA-C     Allergies  Allergen Reactions  . Aspirin Nausea And Vomiting and Rash       Objective:  Physical Exam  Constitutional: She is oriented to person, place, and time. Vital signs are normal.  She appears well-developed and well-nourished. She is active and cooperative. No distress.  BP 154/92 mmHg  Pulse 86  Temp(Src) 98.2 F (36.8 C) (Oral)  Resp 16  Ht 5\' 4"  (1.626 m)  Wt 188 lb 9.6 oz (85.548 kg)  BMI 32.36 kg/m2  HENT:  Head: Normocephalic and atraumatic.  Right Ear: Hearing normal.  Left Ear: Hearing normal.  Eyes: Conjunctivae are normal. No scleral icterus.  Neck: Normal range of motion. Neck supple. No thyromegaly  present.  Cardiovascular: Normal rate, regular rhythm and normal heart sounds.   Pulses:      Radial pulses are 2+ on the right side, and 2+ on the left side.  Pulmonary/Chest: Effort normal and breath sounds normal.  Lymphadenopathy:       Head (right side): No tonsillar, no preauricular, no posterior auricular and no occipital adenopathy present.       Head (left side): No tonsillar, no preauricular, no posterior auricular and no occipital adenopathy present.    She has no cervical adenopathy.       Right: No supraclavicular adenopathy present.       Left: No supraclavicular adenopathy present.  Neurological: She is alert and oriented to person, place, and time. No sensory deficit.  Skin: Skin is warm, dry and intact. No rash noted. No cyanosis or erythema. Nails show no clubbing.  Psychiatric: She has a normal mood and affect. Her speech is normal and behavior is normal.           Assessment & Plan:   1. Benign essential HTN Uncontrolled, but improved. Continue the losartan-HCTZ 50-12.5. ADD the losartan 50 mg tablets that I gave her last month, to achieve a dose of losartan 100 mg with HCTZ 12.5 mg. Reassess in 4-6 weeks, sooner if needed.  2. Insomnia Try increasing trazodone from 50 mg to 100 mg. She can take 150 mg if needed. - traZODone (DESYREL) 100 MG tablet; Take 1-1.5 tablets (100-150 mg total) by mouth at bedtime as needed for sleep.  Dispense: 46 tablet; Refill: 3  3. Need for pneumococcal vaccination - Pneumococcal conjugate vaccine 13-valent IM   Fara Chute, PA-C Physician Assistant-Certified [facility]

## 2014-11-28 ENCOUNTER — Encounter: Payer: Self-pay | Admitting: Physician Assistant

## 2014-11-28 DIAGNOSIS — M797 Fibromyalgia: Secondary | ICD-10-CM

## 2014-11-28 DIAGNOSIS — Z23 Encounter for immunization: Secondary | ICD-10-CM

## 2014-12-17 ENCOUNTER — Ambulatory Visit (INDEPENDENT_AMBULATORY_CARE_PROVIDER_SITE_OTHER): Payer: Medicare Other | Admitting: Physician Assistant

## 2014-12-17 ENCOUNTER — Encounter: Payer: Self-pay | Admitting: Physician Assistant

## 2014-12-17 VITALS — BP 154/80 | HR 91 | Temp 98.4°F | Resp 16 | Ht 64.0 in | Wt 185.2 lb

## 2014-12-17 DIAGNOSIS — Z86010 Personal history of colonic polyps: Secondary | ICD-10-CM | POA: Insufficient documentation

## 2014-12-17 DIAGNOSIS — G4700 Insomnia, unspecified: Secondary | ICD-10-CM

## 2014-12-17 DIAGNOSIS — Z800 Family history of malignant neoplasm of digestive organs: Secondary | ICD-10-CM | POA: Insufficient documentation

## 2014-12-17 DIAGNOSIS — I10 Essential (primary) hypertension: Secondary | ICD-10-CM

## 2014-12-17 DIAGNOSIS — R059 Cough, unspecified: Secondary | ICD-10-CM

## 2014-12-17 DIAGNOSIS — R05 Cough: Secondary | ICD-10-CM | POA: Diagnosis not present

## 2014-12-17 MED ORDER — LOSARTAN POTASSIUM-HCTZ 100-25 MG PO TABS
1.0000 | ORAL_TABLET | Freq: Every day | ORAL | Status: DC
Start: 2014-12-17 — End: 2017-03-11

## 2014-12-17 MED ORDER — ALBUTEROL SULFATE HFA 108 (90 BASE) MCG/ACT IN AERS: 2.0000 | INHALATION_SPRAY | Freq: Four times a day (QID) | RESPIRATORY_TRACT | Status: DC | PRN

## 2014-12-17 NOTE — Patient Instructions (Signed)
Try to get the records related to the hepatitis from [facility]. If you get the fax number to medical records, you can bring it here and sign the release and we can fax it.

## 2014-12-17 NOTE — Progress Notes (Signed)
Patient ID: Heather Horton, female    DOB: 1945-11-25, 69 y.o.   MRN: [id number]  PCP: No primary care provider on file.  Subjective:   Chief Complaint  Patient presents with  . Follow-up    BLOOD PRESSURE    HPI Presents for evaluation of benign essential HTN.   At last visit on 11/19/14, losartan 50 mg was added to her regimen of losartan/HCTZ 50/12.5 mg to achieve a dose of 100 mg losartan d/t continued elevated BP readings. One month prior to that visit, the patient was taking losartan alone d/t dizziness associated with HCTZ, however Dr. Charlestine Night re-started her on the combination therapy d/t continued poor BP control. Today she reports improvement of her dizziness and HAs, however she still complains of fatigue and exhaustion. She checks her BP every other day with the help of her daughter and states her BP is still elevated.  Walking from her bedroom to the kitchen makes her tired.   She also reports recent coughing episodes lasting several minutes at night with white sputum, but no blood. The spells are relieved by sitting upright and taking 1-2 puffs of her albuterol inhaler.    No other concerns on today's visit.    Review of Systems Constitutional: Positive for fatigue. Negative for fever, chills and unexpected weight change.  HENT: Negative for sore throat (cough).  Eyes: Negative.  Respiratory: Positive for cough (nighttime). Negative for shortness of breath and wheezing.  Cardiovascular: Negative for chest pain, palpitations and leg swelling.  Gastrointestinal: Negative for vomiting, abdominal pain, diarrhea and blood in stool.  Endocrine: Negative.  Genitourinary: Negative for dysuria, urgency and frequency.  Musculoskeletal: Positive for myalgias, back pain, arthralgias and neck pain.  Skin: Negative.  Allergic/Immunologic: Positive for environmental allergies.  Neurological: Positive for dizziness (improved), weakness and headaches (improved). Negative for numbness.   Psychiatric/Behavioral: Negative.      Patient Active Problem List   Diagnosis Date Noted  . Family history of colon cancer 12/17/2014  . History of colonic polyps 12/17/2014  . Fibromyalgia 11/28/2014  . Need for pneumococcal vaccination 11/28/2014  . Rheumatoid arthritis ([place]) 10/17/2014  . Esophageal reflux 10/17/2014  . Hepatitis C 10/17/2014  . Benign essential HTN 10/17/2014  . Smoker 10/17/2014  . Immune to hepatitis B 10/17/2014  . Need for hepatitis A vaccination 10/17/2014  . Chronic constipation   . Fibrocystic breast disease      Prior to Admission medications   Medication Sig Start Date End Date Taking? Authorizing Provider  albuterol (PROVENTIL HFA;VENTOLIN HFA) 108 (90 BASE) MCG/ACT inhaler Inhale 2 puffs into the lungs every 6 (six) hours as needed for wheezing. 12/17/14  Yes Adajah Cocking, PA-C  fexofenadine (ALLEGRA) 180 MG tablet Take 180 mg by mouth daily.   Yes Historical Provider, MD  Multiple Vitamins-Minerals (MULTIVITAMIN WITH MINERALS) tablet Take 1 tablet by mouth daily.   Yes Historical Provider, MD  omeprazole (PRILOSEC) 40 MG capsule Take 1 capsule (40 mg total) by mouth daily. 10/18/14  Yes Niomie Englert, PA-C  tiZANidine (ZANAFLEX) 4 MG capsule Take 8 mg by mouth once. Takes 2 at bedtime   Yes Historical Provider, MD  traMADol (ULTRAM) 50 MG tablet Take 50 mg by mouth 2 (two) times daily. Take two tablets by mouth twice a day as needed   Yes Historical Provider, MD  traZODone (DESYREL) 100 MG tablet Take 1-1.5 tablets (100-150 mg total) by mouth at bedtime as needed for sleep. 11/19/14  Yes Dmiyah Liscano Jacqulynn Cadet, PA-C  losartan-hydrochlorothiazide [facility])  100-25 MG tablet Take 1 tablet by mouth daily. 12/17/14   Mikail Goostree, PA-C  zoster vaccine live, PF, (ZOSTAVAX) 73532 UNT/0.65ML injection Inject 19,400 Units into the skin once. 10/17/14   [name], PA-C     Allergies  Allergen Reactions  . Aspirin Nausea And Vomiting and Rash         Objective:  Physical Exam  Constitutional: She is oriented to person, place, and time. Vital signs are normal. She appears well-developed and well-nourished. She is active and cooperative. No distress.  BP 154/80 mmHg  Pulse 91  Temp(Src) 98.4 F (36.9 C) (Oral)  Resp 16  Ht 5\' 4"  (1.626 m)  Wt 185 lb 3.2 oz (84.006 kg)  BMI 31.77 kg/m2  HENT:  Head: Normocephalic and atraumatic.  Right Ear: Hearing normal.  Left Ear: Hearing normal.  Eyes: Conjunctivae are normal. No scleral icterus.  Neck: Normal range of motion. Neck supple. No thyromegaly present.  Cardiovascular: Normal rate, regular rhythm and normal heart sounds.   Pulses:      Radial pulses are 2+ on the right side, and 2+ on the left side.  Pulmonary/Chest: Effort normal and breath sounds normal.  Lymphadenopathy:       Head (right side): No tonsillar, no preauricular, no posterior auricular and no occipital adenopathy present.       Head (left side): No tonsillar, no preauricular, no posterior auricular and no occipital adenopathy present.    She has no cervical adenopathy.       Right: No supraclavicular adenopathy present.       Left: No supraclavicular adenopathy present.  Neurological: She is alert and oriented to person, place, and time. No sensory deficit.  Skin: Skin is warm, dry and intact. No rash noted. No cyanosis or erythema. Nails show no clubbing.  Psychiatric: She has a normal mood and affect. Her speech is normal and behavior is normal.           Assessment & Plan:   1. Benign essential HTN Increase HCTZ back to 25 mg. Continue losartan 100 mg. If remains elevated in 1 month, will retry olmesartanHCTZ, which she reports worked well before she was switched, we think due to cost.  Now that DiovanHCT is generic, it may be an option. If that remains ineffective at getting her to goal, will add amlodipine. - losartan-hydrochlorothiazide (HYZAAR) 100-25 MG tablet; Take 1 tablet by mouth daily.  Dispense:  90 tablet; Refill: 3  2. Insomnia Much improved with trazodone. She still feels tired during the day.  3. Cough Wakes during the night sometimes. Good relief with albuterol. - albuterol (PROVENTIL HFA;VENTOLIN HFA) 108 (90 BASE) MCG/ACT inhaler; Inhale 2 puffs into the lungs every 6 (six) hours as needed for wheezing.  Dispense: 1 Inhaler; Refill: 2  4. Family history of colon cancer 5. History of colonic polyps 6. Hepatitis C She sees GI soon. Hopes that she will be able to get Hep C treatment there, too, which I doubt. She was not happy with the Hepatology clinic [facility]), so we will need to explore alternatives.   Fara Chute, PA-C Physician Assistant-Certified [facility]

## 2014-12-17 NOTE — Progress Notes (Signed)
Subjective:    Patient ID: Heather Horton, female    DOB: June 13, 1945, 69 y.o.   MRN: [id number]  Chief Complaint  Patient presents with  . Follow-up    BLOOD PRESSURE    HPI Patient presents today for 1 month f/u of benign essential HTN. At last visit on 11/19/14, losartan 50 mg was added to her regimen of losartan/HCTZ 50/12.5 mg to achieve a dose of 100 mg losartan d/t continued elevated BP readings. One month prior to that visit, the patient was taking losartan alone d/t dizziness associated with HCTZ, however Dr. Charlestine Night re-started her on the combination therapy d/t continued poor BP control. Today she reports improvement of her dizziness and HAs, however she still complains of fatigue and exhaustion. Walking from her bedroom to the kitchen makes her tired. She also reports recent coughing episodes lasting several minutes at night with white sputum, but no blood. The spells are relieved by sitting upright and taking 1-2 puffs of her albuterol inhaler. She checks her BP every other day with the help of her daughter and states her BP is still elevated. No other concerns on today's visit.   Review of Systems  Constitutional: Positive for fatigue. Negative for fever, chills and unexpected weight change.  HENT: Negative for sore throat (cough).   Eyes: Negative.   Respiratory: Positive for cough (nighttime). Negative for shortness of breath and wheezing.   Cardiovascular: Negative for chest pain, palpitations and leg swelling.  Gastrointestinal: Negative for vomiting, abdominal pain, diarrhea and blood in stool.  Endocrine: Negative.   Genitourinary: Negative for dysuria, urgency and frequency.  Musculoskeletal: Positive for myalgias, back pain, arthralgias and neck pain.  Skin: Negative.   Allergic/Immunologic: Positive for environmental allergies.  Neurological: Positive for dizziness (improved), weakness and headaches (improved). Negative for numbness.  Psychiatric/Behavioral: Negative.      Patient Active Problem List   Diagnosis Date Noted  . Family history of colon cancer 12/17/2014  . History of colonic polyps 12/17/2014  . Fibromyalgia 11/28/2014  . Need for pneumococcal vaccination 11/28/2014  . Rheumatoid arthritis ([place]) 10/17/2014  . Esophageal reflux 10/17/2014  . Hepatitis C 10/17/2014  . Benign essential HTN 10/17/2014  . Smoker 10/17/2014  . Immune to hepatitis B 10/17/2014  . Need for hepatitis A vaccination 10/17/2014  . Chronic constipation   . Fibrocystic breast disease    Prior to Admission medications   Medication Sig Start Date End Date Taking? Authorizing Provider  albuterol (PROVENTIL HFA;VENTOLIN HFA) 108 (90 BASE) MCG/ACT inhaler Inhale 2 puffs into the lungs every 6 (six) hours as needed for wheezing. 12/17/14  Yes Chelle Jeffery, PA-C  fexofenadine (ALLEGRA) 180 MG tablet Take 180 mg by mouth daily.   Yes Historical Provider, MD  Multiple Vitamins-Minerals (MULTIVITAMIN WITH MINERALS) tablet Take 1 tablet by mouth daily.   Yes Historical Provider, MD  omeprazole (PRILOSEC) 40 MG capsule Take 1 capsule (40 mg total) by mouth daily. 10/18/14  Yes Chelle Jeffery, PA-C  tiZANidine (ZANAFLEX) 4 MG capsule Take 8 mg by mouth once. Takes 2 at bedtime   Yes Historical Provider, MD  traMADol (ULTRAM) 50 MG tablet Take 50 mg by mouth 2 (two) times daily. Take two tablets by mouth twice a day as needed   Yes Historical Provider, MD  traZODone (DESYREL) 100 MG tablet Take 1-1.5 tablets (100-150 mg total) by mouth at bedtime as needed for sleep. 11/19/14  Yes Chelle Jeffery, PA-C  losartan-hydrochlorothiazide (HYZAAR) 100-25 MG tablet Take 1 tablet by mouth daily.  12/17/14   Chelle Jeffery, PA-C  zoster vaccine live, PF, (ZOSTAVAX) 26948 UNT/0.65ML injection Inject 19,400 Units into the skin once. 10/17/14   [name], PA-C   Allergies  Allergen Reactions  . Aspirin Nausea And Vomiting and Rash   Family History  Problem Relation Age of Onset  . Asthma  Mother   . Cancer Sister 48    colon cancer  . Stroke Brother 42  . Stroke Sister 74  . Arthritis Daughter   . Pulmonary embolism Daughter 6    on chronic warfarin  . Hypertension Son 49   Social History   Social History  . Marital Status: Single    Spouse Name: n/a  . Number of Children: 2  . Years of Education: 14+   Occupational History  . retired     phlebotomy, EKG tech; taught both   Social History Main Topics  . Smoking status: Current Every Day Smoker -- 1.00 packs/day    Types: Cigarettes  . Smokeless tobacco: Never Used     Comment: I'm scared of e-cigarettes, interested in patches  . Alcohol Use: 8.4 - 10.8 oz/week    14-18 Standard drinks or equivalent per week     Comment: I love my Brandy, I'm gonna drink a couple of drinks each evening, it helps me sleep  . Drug Use: No  . Sexual Activity: Not Currently   Other Topics Concern  . Not on file   Social History Narrative   Raised in [place], [place].   Moved to [place], [place] and then came to [place] in 2013.   Lives alone.   Daughter lives around the corner.    Raised her niece as her own daughter, and she lives nearby.   Son lives in [place].      Objective:   Physical Exam  Constitutional: She is oriented to person, place, and time. She appears well-developed and well-nourished. No distress.  BP 154/80 mmHg  Pulse 91  Temp(Src) 98.4 F (36.9 C) (Oral)  Resp 16  Ht 5\' 4"  (1.626 m)  Wt 185 lb 3.2 oz (84.006 kg)  BMI 31.77 kg/m2  HENT:  Head: Normocephalic and atraumatic.  Eyes: EOM are normal. No scleral icterus.  Neck: Neck supple. No JVD present. No thyromegaly present.  Cardiovascular: Normal rate, regular rhythm, normal heart sounds and intact distal pulses.  Exam reveals no gallop and no friction rub.   No murmur heard. Pulmonary/Chest: Effort normal and breath sounds normal. No respiratory distress. She has no wheezes. She has no rales.  Abdominal: Soft. Bowel sounds are normal. She  exhibits no distension and no mass. There is no tenderness.  Musculoskeletal: She exhibits tenderness (mild neck and feet). She exhibits no edema.  Lymphadenopathy:    She has no cervical adenopathy.  Neurological: She is alert and oriented to person, place, and time.  Skin: Skin is warm and dry. No rash noted. No erythema.  Psychiatric: She has a normal mood and affect. Her behavior is normal. Judgment and thought content normal.  BP rechecked x 2: 152/90 and 162/90     Assessment & Plan:  1. Benign essential HTN - Will d/c losartan/HCTZ 50/12.5 mg + losartan 50 mg and start losartan/HCTZ 100/25 mg daily. Instructed patient to return to clinic if dizziness recurs and we can stop losartan/HCTZ altogether and switch to Diovan/HCTZ, which she has been on previously without side effects.  - losartan-hydrochlorothiazide (HYZAAR) 100-25 MG tablet; Take 1 tablet by mouth daily.  Dispense: 90  tablet; Refill: 3  2. Insomnia - Continue trazadone 100 mg 1-1.5 tablets at bedtime prn for sleep.  3. Cough - Will refill albuterol inhaler. Nighttime coughing spells are likely contributing to patient's fatigue and exhaustion during the day.  - albuterol (PROVENTIL HFA;VENTOLIN HFA) 108 (90 BASE) MCG/ACT inhaler; Inhale 2 puffs into the lungs every 6 (six) hours as needed for wheezing.  Dispense: 1 Inhaler; Refill: 2  4. Family history of colon cancer - Sister died of colon cancer at age 66.  10. History of colonic polyps - Patient is scheduled to f/u with GI for polyps and possible Hep C monitoring. If GI not able to manage Hep C, will contact [facility] to set up appointment for patient.   Patient will return in 4 weeks for re-evaluation of BP.

## 2015-01-07 ENCOUNTER — Ambulatory Visit: Payer: Self-pay | Admitting: Physician Assistant

## 2015-01-10 ENCOUNTER — Ambulatory Visit (INDEPENDENT_AMBULATORY_CARE_PROVIDER_SITE_OTHER): Payer: Medicare Other | Admitting: Gastroenterology

## 2015-01-10 ENCOUNTER — Encounter: Payer: Self-pay | Admitting: Gastroenterology

## 2015-01-10 VITALS — BP 130/80 | HR 84 | Ht 64.0 in | Wt 180.4 lb

## 2015-01-10 DIAGNOSIS — Z800 Family history of malignant neoplasm of digestive organs: Secondary | ICD-10-CM

## 2015-01-10 MED ORDER — NA SULFATE-K SULFATE-MG SULF 17.5-3.13-1.6 GM/177ML PO SOLN: 1.0000 | Freq: Once | ORAL | Status: DC

## 2015-01-10 NOTE — Progress Notes (Signed)
HPI: This is a  very pleasant 69 year old woman    who was referred to me by [name], PA-C  to evaluate  family history colon cancer .    Chief complaint is family history of colon cancer  She is here with her daughter today  Her sister died of colon cancer at age 2.    Her grandfather had colon cancer.  She had colonoscopy 6-7 year ago in [place], [place]. This was her first. She believes polyps were found and she was told she needed colonoscopy in 4 years.  She tends to be constipated.  Can be 4-5 days in between bms.  She has tried Metamucil and she said it makes things worse  Overall weight fluctuates  No overt gi bleeding.  Review of systems: Pertinent positive and negative review of systems were noted in the above HPI section. Complete review of systems was performed and was otherwise normal.   Past Medical History  Diagnosis Date  . Bronchitis   . Hypertension   . Hepatitis C   . Arthritis   . Bursitis   . Galactorrhea on right side   . Chronic constipation   . Fibrocystic breast disease   . Cervical cancer ([place])   . Colon polyps   . Hyperlipidemia   . Insomnia     Past Surgical History  Procedure Laterality Date  . Appendectomy    . Abdominal hysterectomy  30's    due to cervical cancer    Current Outpatient Prescriptions  Medication Sig Dispense Refill  . albuterol (PROVENTIL HFA;VENTOLIN HFA) 108 (90 BASE) MCG/ACT inhaler Inhale 2 puffs into the lungs every 6 (six) hours as needed for wheezing. 1 Inhaler 2  . fexofenadine (ALLEGRA) 180 MG tablet Take 180 mg by mouth daily.    Marland Kitchen losartan-hydrochlorothiazide (HYZAAR) 100-25 MG tablet Take 1 tablet by mouth daily. 90 tablet 3  . Multiple Vitamins-Minerals (MULTIVITAMIN WITH MINERALS) tablet Take 1 tablet by mouth daily.    Marland Kitchen omeprazole (PRILOSEC) 40 MG capsule Take 1 capsule (40 mg total) by mouth daily. 90 capsule 3  . tiZANidine (ZANAFLEX) 4 MG capsule Take 8 mg by mouth once. Takes 2 at bedtime     . traMADol (ULTRAM) 50 MG tablet Take 50 mg by mouth 2 (two) times daily. Take two tablets by mouth twice a day as needed    . traZODone (DESYREL) 100 MG tablet Take 1-1.5 tablets (100-150 mg total) by mouth at bedtime as needed for sleep. 46 tablet 3  . zoster vaccine live, PF, (ZOSTAVAX) 16109 UNT/0.65ML injection Inject 19,400 Units into the skin once. 1 each 0   No current facility-administered medications for this visit.    Allergies as of 01/10/2015 - Review Complete 01/10/2015  Allergen Reaction Noted  . Aspirin Nausea And Vomiting and Rash 11/19/2011    Family History  Problem Relation Age of Onset  . Asthma Mother   . Colon cancer Sister 59  . Stroke Brother 35  . Stroke Sister 45  . Arthritis Daughter   . Pulmonary embolism Daughter 26    on chronic warfarin  . Hypertension Son 69    Social History   Social History  . Marital Status: Single    Spouse Name: n/a  . Number of Children: 3  . Years of Education: 14+   Occupational History  . retired     phlebotomy, EKG tech; taught both   Social History Main Topics  . Smoking status: Current Every Day Smoker --  1.00 packs/day for 40 years    Types: Cigarettes  . Smokeless tobacco: Never Used     Comment: I'm scared of e-cigarettes, interested in patches, tobacco info given 01/10/15  . Alcohol Use: 8.4 - 10.8 oz/week    14-18 Standard drinks or equivalent per week     Comment: I love my Brandy, I'm gonna drink a couple of drinks each evening, it helps me sleep  . Drug Use: No  . Sexual Activity: Not Currently   Other Topics Concern  . Not on file   Social History Narrative   Raised in [place], [place].   Moved to [place], [place] and then came to [place] in 2013.   Lives alone.   Daughter lives around the corner.    Raised her niece as her own daughter, and she lives nearby.   Son lives in [place].     Physical Exam: BP 130/80 mmHg  Pulse 84  Ht 5\' 4"  (1.626 m)  Wt 180 lb 6.4 oz (81.829 kg)  BMI  30.95 kg/m2 Constitutional: generally well-appearing Psychiatric: alert and oriented x3 Eyes: extraocular movements intact Mouth: oral pharynx moist, no lesions Neck: supple no lymphadenopathy Cardiovascular: heart regular rate and rhythm Lungs: clear to auscultation bilaterally Abdomen: soft, nontender, nondistended, no obvious ascites, no peritoneal signs, normal bowel sounds Extremities: no lower extremity edema bilaterally Skin: no lesions on visible extremities   Assessment and plan: 69 y.o. female with  mild chronic constipation, family history of colon cancer  Her sister had colon cancer and died from it at age 37. She has not had colon cancer screening in 6 or 7 years. That was with a colonoscopy in [place]. We don't have those records and she cannot recall the name of Dr. exactly where it was done. Really regardless. She needs colonoscopy now given her significant family history of colon cancer. We will arrange for that to be done. I also recommended she try fiber supplements with Citrucel for her mild chronic constipation.   Owens Loffler, MD [facility] 01/10/2015, 2:22 PM  Cc: [name], PA-C

## 2015-01-10 NOTE — Patient Instructions (Addendum)
One of your biggest health concerns is your smoking.  This increases your risk for most cancers and serious cardiovascular diseases such as strokes, heart attacks.  You should try your best to stop.  If you need assistance, please contact your PCP or Smoking Cessation Class at [facility] [phone]) or [facility] (1-800-QUIT-NOW).   You will be set up for a colonoscopy for family history of colon cancer.   Please start taking citrucel (orange flavored) powder fiber supplement.  This may cause some bloating at first but that usually goes away. Begin with a small spoonful and work your way up to a large, heaping spoonful daily over a week.   You have been scheduled for a colonoscopy. Please follow written instructions given to you at your visit today.  Please pick up your prep supplies at the pharmacy within the next 1-3 days. If you use inhalers (even only as needed), please bring them with you on the day of your procedure. Your physician has requested that you go to www.startemmi.com and enter the access code given to you at your visit today. This web site gives a general overview about your procedure. However, you should still follow specific instructions given to you by our office regarding your preparation for the procedure.

## 2015-01-21 ENCOUNTER — Ambulatory Visit (INDEPENDENT_AMBULATORY_CARE_PROVIDER_SITE_OTHER): Payer: Medicare Other | Admitting: Physician Assistant

## 2015-01-21 ENCOUNTER — Encounter: Payer: Self-pay | Admitting: Physician Assistant

## 2015-01-21 VITALS — BP 130/76 | HR 101 | Temp 98.6°F | Resp 16 | Ht 63.75 in | Wt 182.8 lb

## 2015-01-21 DIAGNOSIS — M797 Fibromyalgia: Secondary | ICD-10-CM | POA: Diagnosis not present

## 2015-01-21 DIAGNOSIS — I10 Essential (primary) hypertension: Secondary | ICD-10-CM | POA: Diagnosis not present

## 2015-01-21 DIAGNOSIS — B1920 Unspecified viral hepatitis C without hepatic coma: Secondary | ICD-10-CM | POA: Diagnosis not present

## 2015-01-21 DIAGNOSIS — Z23 Encounter for immunization: Secondary | ICD-10-CM

## 2015-01-21 DIAGNOSIS — Z720 Tobacco use: Secondary | ICD-10-CM

## 2015-01-21 DIAGNOSIS — M069 Rheumatoid arthritis, unspecified: Secondary | ICD-10-CM | POA: Diagnosis not present

## 2015-01-21 DIAGNOSIS — F17200 Nicotine dependence, unspecified, uncomplicated: Secondary | ICD-10-CM

## 2015-01-21 DIAGNOSIS — G4700 Insomnia, unspecified: Secondary | ICD-10-CM

## 2015-01-21 MED ORDER — TRAZODONE HCL 100 MG PO TABS: 200.0000 mg | ORAL_TABLET | Freq: Every evening | ORAL | Status: DC | PRN

## 2015-01-21 NOTE — Progress Notes (Signed)
Patient ID: Heather Horton, female    DOB: 05-24-1945, 69 y.o.   MRN: [id number]  PCP: Pcp Not In System  Subjective:   Chief Complaint  Patient presents with  . Follow-up  . Hypertension  . back pain    x 2 wks or more    HPI Presents for evaluation of HTN.  She is tolerating the losartanHCT 100-25 without dizziness or other adverse effect.  Also finds that the trazodone works to help her sleep at the 200 mg dose (it doesn't split well for 150 mg), but she runs out early, and requests a new prescription.  Reports back pain, from the shoulders to the upper buttocks x 2 weeks. Constant. Worse with movement, but present at rest.  Feels like her current rheumatologist doesn't listen. Didn't want to look at any of the records she brought from [place], stating that he preferred to "start fresh." Would like to see someone else.  She saw GI for colon cancer screening, and asked if they would manage Hepatitis C. Unfortunately, they do not, and recommended the clinic where she's been previously and was unhappy. Would like to try [facility].   Review of Systems  Constitutional: Positive for fatigue. Negative for fever, chills and diaphoresis.  Eyes: Negative for photophobia and visual disturbance.  Respiratory: Negative for cough, choking, chest tightness, shortness of breath and wheezing.   Cardiovascular: Negative for chest pain, palpitations and leg swelling.  Musculoskeletal: Positive for myalgias, back pain, joint swelling, arthralgias, gait problem, neck pain and neck stiffness.  Skin: Negative for rash and wound.  Neurological: Negative for dizziness, numbness and headaches.  Hematological: Negative for adenopathy. Does not bruise/bleed easily.  Psychiatric/Behavioral: Positive for sleep disturbance. Negative for suicidal ideas, confusion, self-injury, dysphoric mood and decreased concentration. The patient is not nervous/anxious.        Patient Active Problem List   Diagnosis Date  Noted  . Family history of colon cancer 12/17/2014  . History of colonic polyps 12/17/2014  . Fibromyalgia 11/28/2014  . Need for pneumococcal vaccination 11/28/2014  . Rheumatoid arthritis ([employer]) 10/17/2014  . Esophageal reflux 10/17/2014  . Hepatitis C 10/17/2014  . Benign essential HTN 10/17/2014  . Smoker 10/17/2014  . Immune to hepatitis B 10/17/2014  . Need for hepatitis A vaccination 10/17/2014  . Chronic constipation   . Fibrocystic breast disease      Prior to Admission medications   Medication Sig Start Date End Date Taking? Authorizing Provider  albuterol (PROVENTIL HFA;VENTOLIN HFA) 108 (90 BASE) MCG/ACT inhaler Inhale 2 puffs into the lungs every 6 (six) hours as needed for wheezing. 12/17/14  Yes Cinch Ormond, PA-C  fexofenadine (ALLEGRA) 180 MG tablet Take 180 mg by mouth daily.   Yes Historical Provider, MD  losartan-hydrochlorothiazide (HYZAAR) 100-25 MG tablet Take 1 tablet by mouth daily. 12/17/14  Yes Hershell Brandl, PA-C  Multiple Vitamins-Minerals (MULTIVITAMIN WITH MINERALS) tablet Take 1 tablet by mouth daily.   Yes Historical Provider, MD  omeprazole (PRILOSEC) 40 MG capsule Take 1 capsule (40 mg total) by mouth daily. 10/18/14  Yes Madolin Twaddle, PA-C  tiZANidine (ZANAFLEX) 4 MG capsule Take 8 mg by mouth once. Takes 2 at bedtime   Yes Historical Provider, MD  traMADol (ULTRAM) 50 MG tablet Take 50 mg by mouth 2 (two) times daily. Take two tablets by mouth twice a day as needed   Yes Historical Provider, MD  traZODone (DESYREL) 100 MG tablet Take 1-1.5 tablets (100-150 mg total) by mouth at bedtime as needed  for sleep. 11/19/14  Yes [name], PA-C     Allergies  Allergen Reactions  . Aspirin Nausea And Vomiting and Rash       Objective:  Physical Exam  Constitutional: She is oriented to person, place, and time. Vital signs are normal. She appears well-developed and well-nourished. She is active and cooperative. No distress.  BP 130/76 mmHg   Pulse 101  Temp(Src) 98.6 F (37 C) (Oral)  Resp 16  Ht 5' 3.75" (1.619 m)  Wt 182 lb 12.8 oz (82.918 kg)  BMI 31.63 kg/m2  SpO2 98%  HENT:  Head: Normocephalic and atraumatic.  Right Ear: Hearing normal.  Left Ear: Hearing normal.  Eyes: Conjunctivae are normal. No scleral icterus.  Neck: Normal range of motion. Neck supple. No thyromegaly present.  Cardiovascular: Normal rate, regular rhythm and normal heart sounds.   Pulses:      Radial pulses are 2+ on the right side, and 2+ on the left side.  Pulmonary/Chest: Effort normal and breath sounds normal.  Musculoskeletal:       Cervical back: She exhibits tenderness and pain. She exhibits normal range of motion and no bony tenderness.       Thoracic back: She exhibits tenderness and pain. She exhibits no bony tenderness and no swelling.       Lumbar back: She exhibits tenderness and pain. She exhibits no bony tenderness.       Back:  Lymphadenopathy:       Head (right side): No tonsillar, no preauricular, no posterior auricular and no occipital adenopathy present.       Head (left side): No tonsillar, no preauricular, no posterior auricular and no occipital adenopathy present.    She has no cervical adenopathy.       Right: No supraclavicular adenopathy present.       Left: No supraclavicular adenopathy present.  Neurological: She is alert and oriented to person, place, and time. No sensory deficit.  Skin: Skin is warm, dry and intact. No rash noted. No cyanosis or erythema. Nails show no clubbing.  Psychiatric: She has a normal mood and affect. Her speech is normal and behavior is normal.           Assessment & Plan:   1. Benign essential HTN Controlled. Continue current treatment.  2. Insomnia Improved. Will improve further when her pain is better controlled. - traZODone (DESYREL) 100 MG tablet; Take 2 tablets (200 mg total) by mouth at bedtime as needed for sleep.  Dispense: 60 tablet; Refill: 3  3. Hepatitis C  virus infection without hepatic coma, unspecified chronicity Ready to consider treatment. Does not want to return to [facility] clinic. - Ambulatory referral to Infectious Disease  4. Rheumatoid arthritis, involving unspecified site, unspecified rheumatoid factor presence ([place]) Referral to Dr. Estanislado Pandy. - Ambulatory referral to Rheumatology  5. Need for hepatitis A vaccination 1st dose today. Second dose in 6-12 months.  - Hepatitis A vaccine adult IM  6. Fibromyalgia See above. - Ambulatory referral to Rheumatology  7. Smoker Encouraged smoking cessation.   Fara Chute, PA-C Physician Assistant-Certified [facility]

## 2015-01-31 ENCOUNTER — Encounter: Payer: Self-pay | Admitting: Gastroenterology

## 2015-01-31 ENCOUNTER — Ambulatory Visit (AMBULATORY_SURGERY_CENTER): Payer: Medicare Other | Admitting: Gastroenterology

## 2015-01-31 VITALS — BP 162/87 | HR 87 | Temp 97.1°F | Resp 16 | Ht 64.0 in | Wt 180.0 lb

## 2015-01-31 DIAGNOSIS — Z1211 Encounter for screening for malignant neoplasm of colon: Secondary | ICD-10-CM

## 2015-01-31 DIAGNOSIS — D123 Benign neoplasm of transverse colon: Secondary | ICD-10-CM | POA: Diagnosis not present

## 2015-01-31 DIAGNOSIS — B1920 Unspecified viral hepatitis C without hepatic coma: Secondary | ICD-10-CM | POA: Diagnosis not present

## 2015-01-31 DIAGNOSIS — I10 Essential (primary) hypertension: Secondary | ICD-10-CM | POA: Diagnosis not present

## 2015-01-31 DIAGNOSIS — D125 Benign neoplasm of sigmoid colon: Secondary | ICD-10-CM

## 2015-01-31 DIAGNOSIS — K219 Gastro-esophageal reflux disease without esophagitis: Secondary | ICD-10-CM | POA: Diagnosis not present

## 2015-01-31 DIAGNOSIS — Z800 Family history of malignant neoplasm of digestive organs: Secondary | ICD-10-CM | POA: Diagnosis not present

## 2015-01-31 DIAGNOSIS — D124 Benign neoplasm of descending colon: Secondary | ICD-10-CM | POA: Diagnosis not present

## 2015-01-31 DIAGNOSIS — F329 Major depressive disorder, single episode, unspecified: Secondary | ICD-10-CM | POA: Diagnosis not present

## 2015-01-31 DIAGNOSIS — M069 Rheumatoid arthritis, unspecified: Secondary | ICD-10-CM | POA: Diagnosis not present

## 2015-01-31 MED ORDER — SODIUM CHLORIDE 0.9 % IV SOLN: 500.0000 mL | INTRAVENOUS | Status: DC

## 2015-01-31 NOTE — Patient Instructions (Addendum)
  AVOID ASPIRIN, ASPIRIN PRODUCTS AND NSAIDS FOR 2 WEEKS, UNTIL February 14, 2015    YOU HAD AN ENDOSCOPIC PROCEDURE TODAY AT [facility]:   Refer to the procedure report that was given to you for any specific questions about what was found during the examination.  If the procedure report does not answer your questions, please call your gastroenterologist to clarify.  If you requested that your care partner not be given the details of your procedure findings, then the procedure report has been included in a sealed envelope for you to review at your convenience later.  YOU SHOULD EXPECT: Some feelings of bloating in the abdomen. Passage of more gas than usual.  Walking can help get rid of the air that was put into your GI tract during the procedure and reduce the bloating. If you had a lower endoscopy (such as a colonoscopy or flexible sigmoidoscopy) you may notice spotting of blood in your stool or on the toilet paper. If you underwent a bowel prep for your procedure, you may not have a normal bowel movement for a few days.  Please Note:  You might notice some irritation and congestion in your nose or some drainage.  This is from the oxygen used during your procedure.  There is no need for concern and it should clear up in a day or so.  SYMPTOMS TO REPORT IMMEDIATELY:   Following lower endoscopy (colonoscopy or flexible sigmoidoscopy):  Excessive amounts of blood in the stool  Significant tenderness or worsening of abdominal pains  Swelling of the abdomen that is new, acute  Fever of 100F or higher   For urgent or emergent issues, a gastroenterologist can be reached at any hour by calling [phone].   DIET: Your first meal following the procedure should be a small meal and then it is ok to progress to your normal diet. Heavy or fried foods are harder to digest and may make you feel nauseous or bloated.  Likewise, meals heavy in dairy and vegetables can increase bloating.   Drink plenty of fluids but you should avoid alcoholic beverages for 24 hours.  ACTIVITY:  You should plan to take it easy for the rest of today and you should NOT DRIVE or use heavy machinery until tomorrow (because of the sedation medicines used during the test).    FOLLOW UP: Our staff will call the number listed on your records the next business day following your procedure to check on you and address any questions or concerns that you may have regarding the information given to you following your procedure. If we do not reach you, we will leave a message.  However, if you are feeling well and you are not experiencing any problems, there is no need to return our call.  We will assume that you have returned to your regular daily activities without incident.  If any biopsies were taken you will be contacted by phone or by letter within the next 1-3 weeks.  Please call us at [phone] if you have not heard about the biopsies in 3 weeks.    SIGNATURES/CONFIDENTIALITY: You and/or your care partner have signed paperwork which will be entered into your electronic medical record.  These signatures attest to the fact that that the information above on your After Visit Summary has been reviewed and is understood.  Full responsibility of the confidentiality of this discharge information lies with you and/or your care-partner.

## 2015-01-31 NOTE — Op Note (Signed)
[place]  [address] [place], [id number]   COLONOSCOPY PROCEDURE REPORT  PATIENT: Heather Horton, Heather Horton  MR#: [id number] BIRTHDATE: 04-03-1945 , 75  yrs. old GENDER: female ENDOSCOPIST: Milus Banister, MD REFERRED [id number] Dellis Filbert, [place] PROCEDURE DATE:  01/31/2015 PROCEDURE:   Colonoscopy, screening and Colonoscopy with snare polypectomy First Screening Colonoscopy - Avg.  risk and is 50 yrs.  old or older - No.  Prior Negative Screening - Now for repeat screening. N/A  History of Adenoma - Now for follow-up colonoscopy & has been > or = to 3 yrs.  N/A  Polyps removed today? Yes ASA CLASS:   Class II INDICATIONS:Screening for colonic neoplasia, FH Colon or Rectal Adenocarcinoma, and sister had colon cancer in her 6s. MEDICATIONS: Monitored anesthesia care and Propofol 500 mg IV  DESCRIPTION OF PROCEDURE:   After the risks benefits and alternatives of the procedure were thoroughly explained, informed consent was obtained.  The digital rectal exam revealed no abnormalities of the rectum.   The LB [id number]  endoscope was introduced through the anus and advanced to the cecum, which was identified by both the appendix and ileocecal valve. No adverse events experienced.   The quality of the prep was excellent.  The instrument was then slowly withdrawn as the colon was fully examined. Estimated blood loss is zero unless otherwise noted in this procedure report.  COLON FINDINGS: Nine polyps were found, removed and sent to pathology.  These were all sessile.  Four of them were 6-32mm across, located in descending and sigmoid, removed with snare/cautery (jar 2).  One was 1.1cm, located in sigmoid, removed with snare/cautery (jar 3).  Four others were 3-48mm across, located in transverse and sigmoid, removed with cold snare (jar 1).  The examination was otherwise normal.  Retroflexed views revealed no abnormalities. The time to cecum = 4.7 Withdrawal time = 21.6    The scope was withdrawn and the procedure completed. COMPLICATIONS: There were no immediate complications.  ENDOSCOPIC IMPRESSION: Nine polyps were found, removed and sent to pathology.  These were all sessile.  Four of them were 6-13mm across, located in descending and sigmoid, removed with snare/cautery (jar 2).  One was 1.1cm, located in sigmoid, removed with snare/cautery (jar 3).  Four others were 3-92mm across, located in transverse and sigmoid, removed with cold snare (jar 1).  The examination was otherwise normal  RECOMMENDATIONS: If the polyp(s) removed today are proven to be adenomatous (pre-cancerous) polyps, you will need a colonoscopy in 3 years. You will receive a letter within 1-2 weeks with the results of your biopsy as well as final recommendations.  Please call my office if you have not received a letter after 3 weeks.  eSigned:  Milus Banister, MD 01/31/2015 4:02 PM

## 2015-01-31 NOTE — Progress Notes (Signed)
Called to room to assist during endoscopic procedure.  Patient ID and intended procedure confirmed with present staff. Received instructions for my participation in the procedure from the performing physician.  

## 2015-01-31 NOTE — Progress Notes (Signed)
Report to PACU, RN, vss, BBS= Clear.  

## 2015-02-03 ENCOUNTER — Telehealth: Payer: Self-pay | Admitting: *Deleted

## 2015-02-03 NOTE — Telephone Encounter (Signed)
  Follow up Call-  Call back number 01/31/2015  Post procedure Call Back phone  # [phone]  Permission to leave phone message Yes     Patient questions:  Do you have a fever, pain , or abdominal swelling? No. Pain Score  0 *  Have you tolerated food without any problems? Yes.    Have you been able to return to your normal activities? Yes.    Do you have any questions about your discharge instructions: Diet   No. Medications  No. Follow up visit  No.  Do you have questions or concerns about your Care? No.  Actions: * If pain score is 4 or above: No action needed, pain <4.

## 2015-02-07 ENCOUNTER — Encounter: Payer: Self-pay | Admitting: Gastroenterology

## 2015-02-26 ENCOUNTER — Other Ambulatory Visit: Payer: Medicare Other

## 2015-02-26 DIAGNOSIS — B182 Chronic viral hepatitis C: Secondary | ICD-10-CM | POA: Diagnosis not present

## 2015-02-26 LAB — IRON: IRON: 242 ug/dL — AB (ref 45–160)

## 2015-02-26 NOTE — Addendum Note (Signed)
Addended by: Dolan Amen D on: 02/26/2015 04:44 PM   Modules accepted: Orders

## 2015-02-27 LAB — HEPATITIS A ANTIBODY, TOTAL: HEP A TOTAL AB: NONREACTIVE

## 2015-02-27 LAB — HEPATITIS B SURFACE ANTIBODY,QUALITATIVE: Hep B S Ab: POSITIVE — AB

## 2015-02-27 LAB — PROTIME-INR
INR: 0.96 (ref <1.50)
PROTHROMBIN TIME: 12.9 s (ref 11.6–15.2)

## 2015-02-27 LAB — HIV ANTIBODY (ROUTINE TESTING W REFLEX): HIV: NONREACTIVE

## 2015-02-27 LAB — HEPATITIS B CORE ANTIBODY, TOTAL: Hep B Core Total Ab: NONREACTIVE

## 2015-02-27 LAB — HEPATITIS B SURFACE ANTIGEN: HEP B S AG: NEGATIVE

## 2015-02-27 LAB — ANA: Anti Nuclear Antibody(ANA): NEGATIVE

## 2015-03-03 LAB — HCV RNA, QUANT REAL-TIME PCR W/REFLEX
HCV RNA, PCR, QN (Log): 6.17 LogIU/mL — ABNORMAL HIGH
HCV RNA, PCR, QN: 1490000 IU/mL — ABNORMAL HIGH

## 2015-03-03 LAB — HCV RNA,LIPA RFLX NS5A DRUG RESIST

## 2015-03-12 ENCOUNTER — Ambulatory Visit (INDEPENDENT_AMBULATORY_CARE_PROVIDER_SITE_OTHER): Payer: Medicare Other | Admitting: Internal Medicine

## 2015-03-12 ENCOUNTER — Encounter: Payer: Self-pay | Admitting: Internal Medicine

## 2015-03-12 VITALS — BP 134/84 | HR 87 | Temp 98.3°F | Ht 65.0 in | Wt 180.0 lb

## 2015-03-12 DIAGNOSIS — B182 Chronic viral hepatitis C: Secondary | ICD-10-CM | POA: Diagnosis present

## 2015-03-12 MED ORDER — ELBASVIR-GRAZOPREVIR 50-100 MG PO TABS: 1.0000 | ORAL_TABLET | Freq: Every day | ORAL | Status: DC

## 2015-03-12 NOTE — Progress Notes (Signed)
[facility] for Infectious Disease   CC: consideration for treatment for chronic hepatitis C  HPI:  +Heather Horton is a 70 y.o. female who presents for initial evaluation and management of chronic hepatitis C.  Patient tested positive several years ago during routine screening. Hepatitis C-associated risk factors present are: history of blood transfusion (details: several transfusions prior to 1990). Patient denies intranasal drug use, IV drug abuse, renal dialysis, sexual contact with person with liver disease, tattoos. Patient has had other studies performed. Results: hepatitis C RNA by PCR, result: positive. Patient has not had prior treatment for Hepatitis C. Patient does not have a past history of liver disease. Patient does not have a family history of liver disease. Patient does not  have associated signs or symptoms related to liver disease.  Labs reviewed and confirm chronic hepatitis C with a positive viral load.   Records reviewed from PCP.  She was previously evaluated by [facility] but did not return and wanted a referral somewhere else.  She has not had previous liver staging except for a remote biopsy in [place] that she does not have a record of.  She is interested in treatment.  She does take omeprazole but states it does not help at all, sleeps sitting up and continues to have issues with GERD.    She does drink alcohol.      Patient does not have documented immunity to Hepatitis A. Patient does have documented immunity to Hepatitis B.    Review of Systems:  Constitutional: negative for fatigue and malaise Gastrointestinal: negative for diarrhea Musculoskeletal: negative for myalgias and arthralgias All other systems reviewed and are negative      Past Medical History  Diagnosis Date  . Bronchitis   . Hypertension   . Hepatitis C   . Arthritis   . Bursitis   . Galactorrhea on right side   . Chronic constipation   . Fibrocystic breast disease   . Cervical cancer  ([place])   . Colon polyps   . Hyperlipidemia   . Insomnia     Prior to Admission medications   Medication Sig Start Date End Date Taking? Authorizing Provider  albuterol (PROVENTIL HFA;VENTOLIN HFA) 108 (90 BASE) MCG/ACT inhaler Inhale 2 puffs into the lungs every 6 (six) hours as needed for wheezing. 12/17/14  Yes Chelle Jeffery, PA-C  fexofenadine (ALLEGRA) 180 MG tablet Take 180 mg by mouth daily.   Yes Historical Provider, MD  losartan-hydrochlorothiazide (HYZAAR) 100-25 MG tablet Take 1 tablet by mouth daily. 12/17/14  Yes Chelle Jeffery, PA-C  Multiple Vitamins-Minerals (MULTIVITAMIN WITH MINERALS) tablet Take 1 tablet by mouth daily.   Yes Historical Provider, MD  omeprazole (PRILOSEC) 40 MG capsule Take 1 capsule (40 mg total) by mouth daily. 10/18/14  Yes Chelle Jeffery, PA-C  tiZANidine (ZANAFLEX) 4 MG capsule Take 8 mg by mouth once. Takes 2 at bedtime   Yes Historical Provider, MD  traMADol (ULTRAM) 50 MG tablet Take 50 mg by mouth 2 (two) times daily. Take two tablets by mouth twice a day as needed   Yes Historical Provider, MD  traZODone (DESYREL) 100 MG tablet Take 2 tablets (200 mg total) by mouth at bedtime as needed for sleep. 01/21/15  Yes Chelle Jeffery, PA-C  Elbasvir-Grazoprevir (ZEPATIER) 50-100 MG TABS Take 1 tablet by mouth daily. 03/12/15   Thayer Headings, MD  zoster vaccine live, PF, (ZOSTAVAX) 25956 UNT/0.65ML injection Inject 19,400 Units into the skin once. Patient not taking: Reported on 01/21/2015  10/17/14   [name], PA-C    Allergies  Allergen Reactions  . Aspirin Nausea And Vomiting and Rash    Social History  Substance Use Topics  . Smoking status: Current Every Day Smoker -- 1.00 packs/day for 40 years    Types: Cigarettes  . Smokeless tobacco: Never Used     Comment: I'm scared of e-cigarettes, interested in patches, tobacco info given 01/10/15  . Alcohol Use: 8.4 - 10.8 oz/week    14-18 Standard drinks or equivalent per week     Comment: I love  my Brandy, I'm gonna drink a couple of drinks each evening, it helps me sleep    Family History  Problem Relation Age of Onset  . Asthma Mother   . Colon cancer Sister 89  . Stroke Brother 92  . Stroke Sister 40  . Arthritis Daughter   . Pulmonary embolism Daughter 77    on chronic warfarin  . Hypertension Son 80  no liver cancer, no cirrhosis   Objective:  Constitutional: in no apparent distress and alert,  Filed Vitals:   03/12/15 1531  BP: 134/84  Pulse: 87  Temp: 98.3 F (36.8 C)   Eyes: anicteric Cardiovascular: Cor RRR and No murmurs Respiratory: CTA B; normal respiratory effort Gastrointestinal: Bowel sounds are normal, liver is not enlarged, spleen is not enlarged Musculoskeletal: peripheral pulses normal, no pedal edema, no clubbing or cyanosis Skin: negative for - jaundice, spider hemangioma, telangiectasia, palmar erythema, ecchymosis and atrophy; no porphyria cutanea tarda Lymphatic: no cervical lymphadenopathy   Laboratory Genotype: No results found for: HCVGENOTYPE HCV viral load: No results found for: HCVQUANT Lab Results  Component Value Date   WBC 4.9 10/17/2014   HGB 16.0* 10/17/2014   HCT 45.5 10/17/2014   MCV 98.5 10/17/2014   PLT 254 10/17/2014    Lab Results  Component Value Date   CREATININE 0.78 10/17/2014   BUN 13 10/17/2014   NA 135 10/17/2014   K 3.3* 10/17/2014   CL 99 10/17/2014   CO2 22 10/17/2014    Lab Results  Component Value Date   ALT 59* 10/17/2014   AST 67* 10/17/2014   ALKPHOS 82 10/17/2014     Labs and history reviewed and show CHILD-PUGH A  5-6 points: Child class A 7-9 points: Child class B 10-15 points: Child class C  Lab Results  Component Value Date   INR 0.96 02/26/2015   BILITOT 0.7 10/17/2014   ALBUMIN 4.7 10/17/2014     Assessment: New Patient with Chronic Hepatitis C genotype 1b, untreated.  I discussed with the patient the lab findings that confirm chronic hepatitis C as well as the natural  history and progression of disease including about 30% of people who develop cirrhosis of the liver if left untreated and once cirrhosis is established there is a 2-7% risk per year of liver cancer and liver failure.  I discussed the importance of treatment and benefits in reducing the risk, even if significant liver fibrosis exists.   Plan: 1) Patient counseled extensively on limiting acetaminophen to no more than 2 grams daily, avoidance of alcohol. 2) Transmission discussed with patient including sexual transmission, sharing razors and toothbrush.   3) Will need referral to gastroenterology if concern for cirrhosis 4) Will need referral for substance abuse counseling: No.; Further work up to include urine drug screen  No. 5) Will prescribe Zepatier for 12 weeks since she requires antiacid medication.   6) Hepatitis A vaccine Yes.   will discuss  with her next visit.  7) Hepatitis B vaccine No. 8) Pneumovax vaccine if concern for cirrhosis 9) Further work up to include liver staging with elastography 10) NS5A test  No. 10) will follow up after elastography

## 2015-03-12 NOTE — Patient Instructions (Signed)
Date 03/12/2015  Dear Ms Roland Earl, As discussed in the [facility], your hepatitis C therapy will include the following medications:          Zepatier (elbasvir 50 mg/grazoprevir 100 mg) for 12 weeks               Please note that ALL MEDICATIONS WILL START ON THE SAME DATE for a total of 12 weeks. ---------------------------------------------------------------- Your HCV Treatment Start Date: TBA   Your HCV genotype:  1b    Liver Fibrosis: TBD    ---------------------------------------------------------------- YOUR PHARMACY CONTACT:   [place] [facility] [place] Phone: [phone] Hours: Monday to Friday 7:30 am to 6:00 pm   Please always contact your pharmacy at least 3-4 business days before you run out of medications to ensure your next month's medication is ready or 1 week prior to running out if you receive it by mail.  Remember, each prescription is for 28 days. ---------------------------------------------------------------- GENERAL NOTES REGARDING YOUR HEPATITIS C MEDICATION:  ZEPATIER is available as a beige-colored, oval-shaped, film-coated tablet debossed with "770" on one side and plain on the other. Each tablet contains 50 mg elbasvir and 100 mg grazoprevir.  Common side effects of ZEPATIER when used without ribavirin include: - feeling tired -trouble sleeping - headache -diarrhea - nausea  Common side effects of ZEPATIER when used with ribavirin include: - low red blood cell counts (anemia) - feeling irritable - headache - stomach pain - feeling tired - depression - shortness of breath - joint pain - rash or itching  Please note that this only lists the most common side effects and is NOT a comprehensive list of the potential side effects of these medications. For more information, please review the drug information sheets that come with your medication package from the pharmacy.   ---------------------------------------------------------------- GENERAL HELPFUL HINTS ON HCV THERAPY: 1. Stay well-hydrated. 2. Notify the [facility] of any changes in your other over-the-counter/herbal or prescription medications. 3. If you miss a dose of your medication, take the missed dose as soon as you remember. Return to your regular time/dose schedule the next day.  4.  Do not stop taking your medications without first talking with your healthcare provider. 5.  You may take Tylenol (acetaminophen), as long as the dose is less than 2000 mg (OR no more than 4 tablets of the Tylenol Extra Strengths 500mg  tablet) in 24 hours. 6.  You will see our pharmacist-specialist within the first 2 weeks of starting your medication. 7.  You will need to obtain routine labs around week 4 and12 weeks after starting and then 3 to 6 months after finishing Zepatier.   8.  If ribavirin is part of your regimen, you also will have a lab visit within 2 weeks.   Scharlene Gloss, [facility] [address] [place], [place]  [id number] [phone]

## 2015-04-08 ENCOUNTER — Telehealth (HOSPITAL_COMMUNITY): Payer: Self-pay

## 2015-04-08 NOTE — Telephone Encounter (Signed)
Called to remind pt of 8am appt at [facility] in radiology. Pt agreed to stay NPO and arrive at 7:45am to get checked in. AW

## 2015-04-09 ENCOUNTER — Ambulatory Visit (HOSPITAL_COMMUNITY)
Admission: RE | Admit: 2015-04-09 | Discharge: 2015-04-09 | Disposition: A | Discharge status: Home / Self Care | Payer: Medicare Other | Source: Ambulatory Visit | Attending: Internal Medicine | Admitting: Internal Medicine

## 2015-04-09 DIAGNOSIS — B182 Chronic viral hepatitis C: Secondary | ICD-10-CM | POA: Insufficient documentation

## 2015-04-09 DIAGNOSIS — B1920 Unspecified viral hepatitis C without hepatic coma: Secondary | ICD-10-CM | POA: Diagnosis not present

## 2015-04-09 DIAGNOSIS — R932 Abnormal findings on diagnostic imaging of liver and biliary tract: Secondary | ICD-10-CM | POA: Diagnosis not present

## 2015-04-10 MED FILL — *ZEPATIER 50-100 MG TABLET: 50-100 | 28 days supply | Qty: 28 | Fill #0

## 2015-04-14 ENCOUNTER — Encounter: Payer: Self-pay | Admitting: Pharmacy Technician

## 2015-04-17 ENCOUNTER — Ambulatory Visit (INDEPENDENT_AMBULATORY_CARE_PROVIDER_SITE_OTHER): Payer: Medicare Other | Admitting: Internal Medicine

## 2015-04-17 ENCOUNTER — Encounter: Payer: Self-pay | Admitting: Internal Medicine

## 2015-04-17 VITALS — BP 162/78 | HR 80 | Temp 98.2°F | Ht 65.0 in | Wt 183.0 lb

## 2015-04-17 DIAGNOSIS — K7400 Hepatic fibrosis, unspecified: Secondary | ICD-10-CM

## 2015-04-17 DIAGNOSIS — B182 Chronic viral hepatitis C: Secondary | ICD-10-CM

## 2015-04-17 NOTE — Assessment & Plan Note (Signed)
Discussed results, avoidance of alcohol.

## 2015-04-17 NOTE — Assessment & Plan Note (Signed)
Doing well on the new medication and will check labs at her next visit in about 3 weeks.

## 2015-04-17 NOTE — Progress Notes (Signed)
   Subjective:    Patient ID: Heather Horton, female    DOB: Jan 23, 1946, 70 y.o.   MRN: [id number]  HPI Here for follow up of HCV.  Genotype 1b, no history of treatment and elastography of F2/3.  Also with GERD and on 40 mg of prilosec and started on Zepatier, which can be taken with ppi of any dose.  Tolerating well since starting 3 days ago.     Review of Systems  Constitutional: Negative for fatigue.  Skin: Negative for rash.  Neurological: Negative for light-headedness and headaches.       Objective:   Physical Exam  Constitutional: She appears well-developed and well-nourished. No distress.  Eyes: No scleral icterus.  Skin: No rash noted.          Assessment & Plan:

## 2015-04-29 ENCOUNTER — Ambulatory Visit (INDEPENDENT_AMBULATORY_CARE_PROVIDER_SITE_OTHER): Payer: Medicare Other | Admitting: Physician Assistant

## 2015-04-29 ENCOUNTER — Encounter: Payer: Self-pay | Admitting: Physician Assistant

## 2015-04-29 VITALS — BP 154/84 | HR 89 | Temp 98.0°F | Resp 16 | Ht 65.0 in | Wt 180.0 lb

## 2015-04-29 DIAGNOSIS — Z720 Tobacco use: Secondary | ICD-10-CM

## 2015-04-29 DIAGNOSIS — R05 Cough: Secondary | ICD-10-CM | POA: Diagnosis not present

## 2015-04-29 DIAGNOSIS — I10 Essential (primary) hypertension: Secondary | ICD-10-CM

## 2015-04-29 DIAGNOSIS — F17200 Nicotine dependence, unspecified, uncomplicated: Secondary | ICD-10-CM

## 2015-04-29 DIAGNOSIS — R059 Cough, unspecified: Secondary | ICD-10-CM

## 2015-04-29 MED ORDER — AZITHROMYCIN 250 MG PO TABS: ORAL_TABLET | ORAL | Status: DC

## 2015-04-29 MED ORDER — BENZONATATE 100 MG PO CAPS: 100.0000 mg | ORAL_CAPSULE | Freq: Three times a day (TID) | ORAL | Status: DC | PRN

## 2015-04-29 MED ORDER — AMLODIPINE BESYLATE 5 MG PO TABS: 5.0000 mg | ORAL_TABLET | Freq: Every day | ORAL | Status: DC

## 2015-04-29 MED ORDER — ALBUTEROL SULFATE HFA 108 (90 BASE) MCG/ACT IN AERS: 2.0000 | INHALATION_SPRAY | Freq: Four times a day (QID) | RESPIRATORY_TRACT | Status: DC | PRN

## 2015-04-29 NOTE — Progress Notes (Signed)
Subjective:    Patient ID: Heather Horton, female    DOB: 02/24/1946, 70 y.o.   MRN: [id number]  Chief Complaint  Patient presents with  . Follow-up  . Hypertension   HPI  Heather Horton is a 70 year old African American female who presents today for a follow up of her HTN as well as recurring coughing spells.   Feels like her BP is doing much better. Is currently taking losartan 100mg  and HCTZ 25mg . Heather Horton checks her BP manually at home several times a week, highest 172/92, lowest 138/70 with averages in 140-150/70-80. Does complain of some headaches, and fatigue. Denies any side effects from medications. Does some exercises at home but finds it difficult to get any cardio because of her fatigue from hep C. Is working on eating healthier and cutting back her salt intake.   Is on week 2 of hepatitis C treatment. Has had extreme fatigue lately which Dr. Chana Bode, her infection/disese specialist, attributes to her hep C infection and treatment. Heather Horton has a follow up with him in the next few weeks.     Had a colonoscopy in December. Was found to have 13 polyps, several of which were large. None were cancerous, but was told if Heather Horton had waited any longer they would've become cancerous. Her first colonoscopy 3 years ago showed only 3 small polyps. Her FH is pertinent for a sister's death at 6 years of age due to colon cancer. Heather Horton was encouraged by her GI doctor to return in 3 years for follow up or sooner if symptoms occur.   Heather Horton has been experiencing "coughing spells" for about a month. The cough is productive of very thick sputum, sometimes white, sometime yellow. Worse at night, associated with rhinorhea, scratchy throat, and PND. Feels like there's something stuck in her throat and is constantly trying to cough it up to get it out. Sometimes Heather Horton coughs so much her chest hurts. Has to sleep in her recliner, because when Heather Horton sleeps flat Heather Horton coughs more and has difficulty breathing. Has tried mucinex with little  relief.   Is going to plan a mammogram in the near future, has had some financial difficulty due to her multitude of other doctor's visits.   States Heather Horton was given a prescription to get a shingles shot at a pharmacy but didn't because it was a live virus and Heather Horton "doesn't do well with live viruses".   Currently smoking but is trying to cut back and is interested in trying the nicotine patches.   PMH, FH, and SH were all reviewed with patient and updated as needed.  Allergies  Allergen Reactions  . Molds & Smuts Other (See Comments)    Per allergy test  . Pollen Extract Other (See Comments)    Per allergy test  . Aspirin Nausea And Vomiting and Rash   Prior to Admission medications   Medication Sig Start Date End Date Taking? Authorizing Provider  albuterol (PROVENTIL HFA;VENTOLIN HFA) 108 (90 BASE) MCG/ACT inhaler Inhale 2 puffs into the lungs every 6 (six) hours as needed for wheezing. 12/17/14  Yes Chelle Jeffery, PA-C  Elbasvir-Grazoprevir (ZEPATIER) 50-100 MG TABS Take by mouth.   Yes Historical Provider, MD  fexofenadine (ALLEGRA) 180 MG tablet Take 180 mg by mouth daily.   Yes Historical Provider, MD  losartan-hydrochlorothiazide (HYZAAR) 100-25 MG tablet Take 1 tablet by mouth daily. 12/17/14  Yes Chelle Jeffery, PA-C  Multiple Vitamins-Minerals (MULTIVITAMIN WITH MINERALS) tablet Take 1 tablet by mouth daily.  Yes Historical Provider, MD  omeprazole (PRILOSEC) 40 MG capsule Take 1 capsule (40 mg total) by mouth daily. 10/18/14  Yes Chelle Jeffery, PA-C  tiZANidine (ZANAFLEX) 4 MG capsule Take 8 mg by mouth once. Takes 2 at bedtime   Yes Historical Provider, MD  traMADol (ULTRAM) 50 MG tablet Take 50 mg by mouth 2 (two) times daily. Take two tablets by mouth twice a day as needed   Yes Historical Provider, MD  traZODone (DESYREL) 100 MG tablet Take 2 tablets (200 mg total) by mouth at bedtime as needed for sleep. 01/21/15  Yes Chelle Jeffery, PA-C  zoster vaccine live, PF,  (ZOSTAVAX) 69629 UNT/0.65ML injection Inject 19,400 Units into the skin once. Patient not taking: Reported on 01/21/2015 10/17/14   [name], PA-C      Review of Systems  Constitutional: Positive for fatigue. Negative for fever and chills.  HENT: Positive for postnasal drip, rhinorrhea, sinus pressure and trouble swallowing. Negative for congestion, hearing loss and sore throat.   Eyes: Negative for visual disturbance.  Respiratory: Positive for cough, chest tightness and shortness of breath.   Cardiovascular: Positive for chest pain.  Gastrointestinal: Negative for nausea, vomiting, abdominal pain, diarrhea and constipation.  Genitourinary: Negative for dysuria and difficulty urinating.  Musculoskeletal: Positive for arthralgias (knees, chronic).  Neurological: Positive for headaches.       Objective:   Physical Exam  Constitutional: Heather Horton is oriented to person, place, and time. Heather Horton appears well-developed and well-nourished.  Blood pressure 154/84, pulse 89, temperature 98 F (36.7 C), resp. rate 16, height 5\' 5"  (1.651 m), weight 180 lb (81.647 kg).   HENT:  Head: Normocephalic and atraumatic.  Right Ear: External ear normal.  Left Ear: External ear normal.  Mouth/Throat: Oropharynx is clear and moist. No oropharyngeal exudate.  Eyes: Conjunctivae and EOM are normal. Pupils are equal, round, and reactive to light. No scleral icterus.  Neck: Normal range of motion and full passive range of motion without pain. Neck supple.  Cardiovascular: Normal rate, regular rhythm, S1 normal, S2 normal and intact distal pulses.  Exam reveals no gallop and no friction rub.   No murmur heard. Pulses:      Radial pulses are 2+ on the right side, and 2+ on the left side.       Dorsalis pedis pulses are 2+ on the right side, and 2+ on the left side.  Pulmonary/Chest: Effort normal and breath sounds normal. Heather Horton has no wheezes. Heather Horton has no rales. Heather Horton exhibits tenderness.  Lymphadenopathy:        Head (right side): No submental, no submandibular, no tonsillar, no preauricular, no posterior auricular and no occipital adenopathy present.       Head (left side): No submental, no submandibular, no tonsillar, no preauricular, no posterior auricular and no occipital adenopathy present.    Heather Horton has no cervical adenopathy.       Right: No supraclavicular adenopathy present.       Left: No supraclavicular adenopathy present.  Neurological: Heather Horton is alert and oriented to person, place, and time. Heather Horton has normal strength.  Reflex Scores:      Bicep reflexes are 2+ on the right side and 2+ on the left side.      Patellar reflexes are 2+ on the right side and 2+ on the left side. Skin: Skin is warm and dry.  Psychiatric: Heather Horton has a normal mood and affect. Her behavior is normal. Judgment and thought content normal.      Assessment &  Plan:  1. Benign essential HTN Her BP is still higher than we want so we will add 5mg  amlodipine to try to get her BP down to goal. Will continue to encourage her to eat healthy and get as much exercise as possible.  - amLODipine (NORVASC) 5 MG tablet; Take 1 tablet (5 mg total) by mouth daily.  Dispense: 90 tablet; Refill: 3  2. Cough Since Heather Horton has had a cough for several weeks and is at increased risk for infection due to her smoking status will prescribe azithromycin. Will also provide tessalon perles to loosen the thick phlegm and help clear it out.  - albuterol (PROVENTIL HFA;VENTOLIN HFA) 108 (90 Base) MCG/ACT inhaler; Inhale 2 puffs into the lungs every 6 (six) hours as needed for wheezing.  Dispense: 1 Inhaler; Refill: 2 - benzonatate (TESSALON) 100 MG capsule; Take 1-2 capsules (100-200 mg total) by mouth 3 (three) times daily as needed for cough.  Dispense: 40 capsule; Refill: 0 - azithromycin (ZITHROMAX) 250 MG tablet; Take 2 tabs PO x 1 dose, then 1 tab PO QD x 4 days  Dispense: 6 tablet; Refill: 0  3. Smoker Heather Horton is interested in quitting. Instructed her that  nicotine replacement options will not interfere with her prescription medications. Encourage her to try the nicotine patches and provided other smoking cessation counseling.

## 2015-04-29 NOTE — Patient Instructions (Signed)
1. ADD the amlodipine to help lower the blood pressure. If it's still running >140/90 regularly in 2-4 weeks, let me know. We may need to increase the dose. 2. The cough may be due to infection, so we are treating that with an antibiotic (azithromycin) and a cough medication (tessalon perles). If the cough continues, let me know. Reflux can also cause cough. 3. You CAN use the nicotine patches, lozenges, gum or inhalers along with your medications!

## 2015-04-29 NOTE — Progress Notes (Signed)
Patient ID: Heather Horton, female    DOB: 1946/02/28, 70 y.o.   MRN: [id number]  PCP: Melissaann Dizdarevic, PA-C  Subjective:   Chief Complaint  Patient presents with  . Follow-up  . Hypertension    HPI Presents for evaluation of HTN as well as recurring coughing spells.   Feels like her BP is doing much better. Is currently taking losartan 100mg  and HCTZ 25mg . She checks her BP manually at home several times a week, highest 172/92, lowest 138/70 with averages in 140-150/70-80. Does complain of some headaches, and fatigue. Denies any side effects from medications. Does some exercises at home but finds it difficult to get any cardio because of her fatigue from hep C. Is working on eating healthier and cutting back her salt intake.   Is on week 2 of hepatitis C treatment. Has had extreme fatigue lately which Dr. Chana Bode, her infection/disese specialist, attributes to her hep C infection and treatment. She has a follow up with him in the next few weeks.   Had a colonoscopy in December. Was found to have 13 polyps, several of which were large. None were cancerous, but was told if she had waited any longer they would've become cancerous. Her first colonoscopy 3 years ago showed only 3 small polyps. Her FH is pertinent for a sister's death at 38 years of age due to colon cancer. She was encouraged by her GI doctor to return in 3 years for follow up or sooner if symptoms occur.   She has been experiencing "coughing spells" for about a month. The cough is productive of very thick sputum, sometimes white, sometime yellow. Worse at night, associated with rhinorhea, scratchy throat, and PND. Feels like there's something stuck in her throat and is constantly trying to cough it up to get it out. Sometimes she coughs so much her chest hurts. Has to sleep in her recliner, because when she sleeps flat she coughs more and has difficulty breathing. Has tried mucinex with little relief.   Is going to plan a  mammogram in the near future, has had some financial difficulty due to her multitude of other doctor's visits.   States she was given a prescription to get a shingles shot at a pharmacy but didn't because it was a live virus and she "doesn't do well with live viruses".   Currently smoking but is trying to cut back and is interested in trying the nicotine patches. .    Review of Systems Constitutional: Positive for fatigue. Negative for fever and chills.  HENT: Positive for postnasal drip, rhinorrhea, sinus pressure and trouble swallowing. Negative for congestion, hearing loss and sore throat.  Eyes: Negative for visual disturbance.  Respiratory: Positive for cough, chest tightness and shortness of breath.  Cardiovascular: Positive for chest pain.  Gastrointestinal: Negative for nausea, vomiting, abdominal pain, diarrhea and constipation.  Genitourinary: Negative for dysuria and difficulty urinating.  Musculoskeletal: Positive for arthralgias (knees, chronic).  Neurological: Positive for headaches.     Patient Active Problem List   Diagnosis Date Noted  . Liver fibrosis ([place]) 04/17/2015  . Family history of colon cancer 12/17/2014  . History of colonic polyps 12/17/2014  . Fibromyalgia 11/28/2014  . Need for pneumococcal vaccination 11/28/2014  . Rheumatoid arthritis ([place]) 10/17/2014  . Esophageal reflux 10/17/2014  . Chronic hepatitis C without hepatic coma ([place]) 10/17/2014  . Benign essential HTN 10/17/2014  . Smoker 10/17/2014  . Immune to hepatitis B 10/17/2014  . Need for hepatitis A vaccination 10/17/2014  .  Chronic constipation   . Fibrocystic breast disease      Prior to Admission medications   Medication Sig Start Date End Date Taking? Authorizing Provider  albuterol (PROVENTIL HFA;VENTOLIN HFA) 108 (90 BASE) MCG/ACT inhaler Inhale 2 puffs into the lungs every 6 (six) hours as needed for wheezing. 12/17/14  Yes Makhari Dovidio, PA-C  Elbasvir-Grazoprevir (ZEPATIER)  50-100 MG TABS Take by mouth.   Yes Historical Provider, MD  fexofenadine (ALLEGRA) 180 MG tablet Take 180 mg by mouth daily.   Yes Historical Provider, MD  losartan-hydrochlorothiazide (HYZAAR) 100-25 MG tablet Take 1 tablet by mouth daily. 12/17/14  Yes Eduard Penkala, PA-C  Multiple Vitamins-Minerals (MULTIVITAMIN WITH MINERALS) tablet Take 1 tablet by mouth daily.   Yes Historical Provider, MD  omeprazole (PRILOSEC) 40 MG capsule Take 1 capsule (40 mg total) by mouth daily. 10/18/14  Yes Tyarra Nolton, PA-C  tiZANidine (ZANAFLEX) 4 MG capsule Take 8 mg by mouth once. Takes 2 at bedtime   Yes Historical Provider, MD  traMADol (ULTRAM) 50 MG tablet Take 50 mg by mouth 2 (two) times daily. Take two tablets by mouth twice a day as needed   Yes Historical Provider, MD  traZODone (DESYREL) 100 MG tablet Take 2 tablets (200 mg total) by mouth at bedtime as needed for sleep. 01/21/15  Yes [name], PA-C     Allergies  Allergen Reactions  . Molds & Smuts Other (See Comments)    Per allergy test  . Pollen Extract Other (See Comments)    Per allergy test  . Aspirin Nausea And Vomiting and Rash       Objective:  Physical Exam  Constitutional: She is oriented to person, place, and time. She appears well-developed and well-nourished. She is active and cooperative. No distress.  BP 154/84 mmHg  Pulse 89  Temp(Src) 98 F (36.7 C)  Resp 16  Ht 5\' 5"  (1.651 m)  Wt 180 lb (81.647 kg)  BMI 29.95 kg/m2  HENT:  Head: Normocephalic and atraumatic.  Right Ear: Hearing normal.  Left Ear: Hearing normal.  Eyes: Conjunctivae are normal. No scleral icterus.  Neck: Normal range of motion. Neck supple. No thyromegaly present.  Cardiovascular: Normal rate, regular rhythm and normal heart sounds.   Pulses:      Radial pulses are 2+ on the right side, and 2+ on the left side.  Pulmonary/Chest: Effort normal and breath sounds normal.  Lymphadenopathy:       Head (right side): No tonsillar, no  preauricular, no posterior auricular and no occipital adenopathy present.       Head (left side): No tonsillar, no preauricular, no posterior auricular and no occipital adenopathy present.    She has no cervical adenopathy.       Right: No supraclavicular adenopathy present.       Left: No supraclavicular adenopathy present.  Neurological: She is alert and oriented to person, place, and time. No sensory deficit.  Skin: Skin is warm, dry and intact. No rash noted. No cyanosis or erythema. Nails show no clubbing.  Psychiatric: She has a normal mood and affect. Her speech is normal and behavior is normal.           Assessment & Plan:   1. Benign essential HTN Continue losartan and HCTZ. Add amlodipine. - amLODipine (NORVASC) 5 MG tablet; Take 1 tablet (5 mg total) by mouth daily.  Dispense: 90 tablet; Refill: 3  2. Cough Cover for bronchitis with azithromycin. - albuterol (PROVENTIL HFA;VENTOLIN HFA) 108 (90 Base)  MCG/ACT inhaler; Inhale 2 puffs into the lungs every 6 (six) hours as needed for wheezing.  Dispense: 1 Inhaler; Refill: 2 - benzonatate (TESSALON) 100 MG capsule; Take 1-2 capsules (100-200 mg total) by mouth 3 (three) times daily as needed for cough.  Dispense: 40 capsule; Refill: 0 - azithromycin (ZITHROMAX) 250 MG tablet; Take 2 tabs PO x 1 dose, then 1 tab PO QD x 4 days  Dispense: 6 tablet; Refill: 0  3. Smoker Encouraged efforts to cut back. Use of nicotine replacement is appropriate with her current treatment.     Fara Chute, PA-C Physician Assistant-Certified [facility]

## 2015-05-07 MED FILL — *ZEPATIER 50-100 MG TABLET: 50-100 | 28 days supply | Qty: 28 | Fill #1

## 2015-05-13 ENCOUNTER — Encounter: Payer: Self-pay | Admitting: Internal Medicine

## 2015-05-13 ENCOUNTER — Ambulatory Visit (INDEPENDENT_AMBULATORY_CARE_PROVIDER_SITE_OTHER): Payer: Medicare Other | Admitting: Internal Medicine

## 2015-05-13 VITALS — BP 122/58 | Temp 98.1°F | Wt 183.0 lb

## 2015-05-13 DIAGNOSIS — K7400 Hepatic fibrosis, unspecified: Secondary | ICD-10-CM

## 2015-05-13 DIAGNOSIS — R799 Abnormal finding of blood chemistry, unspecified: Secondary | ICD-10-CM | POA: Diagnosis not present

## 2015-05-13 DIAGNOSIS — B182 Chronic viral hepatitis C: Secondary | ICD-10-CM

## 2015-05-13 DIAGNOSIS — M25562 Pain in left knee: Secondary | ICD-10-CM | POA: Diagnosis not present

## 2015-05-13 DIAGNOSIS — M797 Fibromyalgia: Secondary | ICD-10-CM | POA: Diagnosis not present

## 2015-05-13 LAB — COMPLETE METABOLIC PANEL WITH GFR
ALT: 11 U/L (ref 6–29)
AST: 21 U/L (ref 10–35)
Albumin: 4.4 g/dL (ref 3.6–5.1)
Alkaline Phosphatase: 63 U/L (ref 33–130)
BUN: 27 mg/dL — AB (ref 7–25)
CO2: 21 mmol/L (ref 20–31)
Calcium: 10.1 mg/dL (ref 8.6–10.4)
Chloride: 99 mmol/L (ref 98–110)
Creat: 1.89 mg/dL — ABNORMAL HIGH (ref 0.50–0.99)
GFR, EST NON AFRICAN AMERICAN: 27 mL/min — AB (ref >=60)
GFR, Est African American: 31 mL/min — ABNORMAL LOW (ref >=60)
GLUCOSE: 108 mg/dL — AB (ref 65–99)
POTASSIUM: 3.6 mmol/L (ref 3.5–5.3)
SODIUM: 135 mmol/L (ref 135–146)
Total Bilirubin: 0.5 mg/dL (ref 0.2–1.2)
Total Protein: 8.1 g/dL (ref 6.1–8.1)

## 2015-05-13 NOTE — Progress Notes (Signed)
   Subjective:    Patient ID: Heather Horton, female    DOB: 10/27/1945, 70 y.o.   MRN: [id number]  HPI Here for follow up of HCV.  Genotype 1b, no history of treatment and elastography of F2/3.  Also with GERD and on 40 mg of prilosec and started on Zepatier now about 4 weeks, though the patient thinks it has only been 2 weeks.  I saw her after she first started though and that was 4 weeks ago.  Some fatigue, no headache.  Has greatly reduced her brandy drinking.     Review of Systems  Constitutional: Negative for fatigue.  Skin: Negative for rash.  Neurological: Negative for light-headedness and headaches.       Objective:   Physical Exam  Constitutional: She appears well-developed and well-nourished. No distress.  Eyes: No scleral icterus.  Cardiovascular: Normal rate, regular rhythm and normal heart sounds.   No murmur heard. Skin: No rash noted.          Assessment & Plan:

## 2015-05-13 NOTE — Assessment & Plan Note (Signed)
Lab today and rtc after treatment completion.

## 2015-05-13 NOTE — Assessment & Plan Note (Signed)
I emphasized continued alcohol avoidance.

## 2015-05-14 LAB — HEPATITIS C RNA QUANTITATIVE

## 2015-06-03 ENCOUNTER — Other Ambulatory Visit: Payer: Self-pay | Admitting: Physician Assistant

## 2015-06-03 MED FILL — *ZEPATIER 50-100 MG TABLET: 50-100 | 28 days supply | Qty: 28 | Fill #2

## 2015-06-03 NOTE — Telephone Encounter (Signed)
Meds ordered this encounter  Medications  . traZODone (DESYREL) 100 MG tablet    Sig: TAKE 2 TABLETS BY MOUTH AT BEDTIME AS NEEDED FOR SLEEP    Dispense:  60 tablet    Refill:  3

## 2015-06-03 NOTE — Telephone Encounter (Signed)
Is it ok to fill Trazodone?

## 2015-07-22 ENCOUNTER — Encounter: Payer: Self-pay | Admitting: Internal Medicine

## 2015-07-22 ENCOUNTER — Ambulatory Visit (INDEPENDENT_AMBULATORY_CARE_PROVIDER_SITE_OTHER): Payer: Medicare Other | Admitting: Internal Medicine

## 2015-07-22 VITALS — BP 135/78 | HR 103 | Temp 97.7°F | Wt 180.0 lb

## 2015-07-22 DIAGNOSIS — B182 Chronic viral hepatitis C: Secondary | ICD-10-CM

## 2015-07-22 DIAGNOSIS — K7400 Hepatic fibrosis, unspecified: Secondary | ICD-10-CM

## 2015-07-22 DIAGNOSIS — F411 Generalized anxiety disorder: Secondary | ICD-10-CM | POA: Diagnosis not present

## 2015-07-22 NOTE — Progress Notes (Signed)
   Subjective:    Patient ID: Heather Horton, female    DOB: 07-07-1945, 70 y.o.   MRN: [id number]  HPI Here for follow up of HCV.  Genotype 1b, no history of treatment and elastography of F2/3.  Also with GERD and on 40 mg of prilosec and started on Zepatier now has completed treatment. Getting hepatitis A series by Heather Horton.  She has noted worsing anxiety and finds it harder to even leave the house. She is nervous about being in a car and has not really been able to drive for the last 2 years due to anxiety.  No SI.     Review of Systems  Constitutional: Negative for fatigue.  Skin: Negative for rash.  Neurological: Negative for light-headedness and headaches.       Objective:   Physical Exam  Constitutional: She appears well-developed and well-nourished. No distress.  Eyes: No scleral icterus.  Cardiovascular: Normal rate, regular rhythm and normal heart sounds.   No murmur heard. Skin: No rash noted.          Assessment & Plan:

## 2015-07-22 NOTE — Assessment & Plan Note (Signed)
She is going to discuss this more with her primary provider with her next visit in one month.  No concerning signs.

## 2015-07-22 NOTE — Assessment & Plan Note (Signed)
Will check next year again for comparison

## 2015-07-22 NOTE — Assessment & Plan Note (Signed)
Will check end of treatment lab today and RTC 4 months for SVR12.

## 2015-07-24 LAB — HEPATITIS C RNA QUANTITATIVE: HCV Quantitative: NOT DETECTED IU/mL (ref <15)

## 2015-07-25 ENCOUNTER — Telehealth: Payer: Self-pay | Admitting: *Deleted

## 2015-07-25 NOTE — Telephone Encounter (Signed)
-----   Message from Thayer Headings, MD sent at 07/25/2015 10:46 AM EDT ----- Please let her know her hepatitis C virus remains gone just after treatment and just one to go in September to confirm cure. thanks

## 2015-07-25 NOTE — Telephone Encounter (Signed)
Per Dr Linus Salmons called the patient and left message of the doctor response. Advised her to call the office if she has any questions.

## 2015-09-12 ENCOUNTER — Other Ambulatory Visit: Payer: Self-pay

## 2015-09-12 MED ORDER — TRAZODONE HCL 100 MG PO TABS: ORAL_TABLET | ORAL | Status: DC

## 2015-09-29 ENCOUNTER — Other Ambulatory Visit: Payer: Self-pay | Admitting: Physician Assistant

## 2015-10-18 ENCOUNTER — Other Ambulatory Visit: Payer: Self-pay | Admitting: Physician Assistant

## 2015-10-28 ENCOUNTER — Ambulatory Visit (INDEPENDENT_AMBULATORY_CARE_PROVIDER_SITE_OTHER): Payer: Medicare Other | Admitting: Physician Assistant

## 2015-10-28 ENCOUNTER — Encounter: Payer: Self-pay | Admitting: Physician Assistant

## 2015-10-28 VITALS — BP 126/80 | HR 89 | Temp 98.4°F | Resp 16 | Ht 64.5 in | Wt 181.6 lb

## 2015-10-28 DIAGNOSIS — Z23 Encounter for immunization: Secondary | ICD-10-CM

## 2015-10-28 DIAGNOSIS — B182 Chronic viral hepatitis C: Secondary | ICD-10-CM | POA: Diagnosis not present

## 2015-10-28 DIAGNOSIS — I10 Essential (primary) hypertension: Secondary | ICD-10-CM

## 2015-10-28 DIAGNOSIS — F419 Anxiety disorder, unspecified: Secondary | ICD-10-CM

## 2015-10-28 DIAGNOSIS — F329 Major depressive disorder, single episode, unspecified: Secondary | ICD-10-CM

## 2015-10-28 DIAGNOSIS — R5383 Other fatigue: Secondary | ICD-10-CM

## 2015-10-28 DIAGNOSIS — F32A Depression, unspecified: Secondary | ICD-10-CM

## 2015-10-28 DIAGNOSIS — F418 Other specified anxiety disorders: Secondary | ICD-10-CM

## 2015-10-28 LAB — COMPREHENSIVE METABOLIC PANEL
ALBUMIN: 4.5 g/dL (ref 3.6–5.1)
ALT: 9 U/L (ref 6–29)
AST: 18 U/L (ref 10–35)
Alkaline Phosphatase: 74 U/L (ref 33–130)
BUN: 11 mg/dL (ref 7–25)
CHLORIDE: 105 mmol/L (ref 98–110)
CO2: 22 mmol/L (ref 20–31)
CREATININE: 0.84 mg/dL (ref 0.50–0.99)
Calcium: 10.3 mg/dL (ref 8.6–10.4)
Glucose, Bld: 98 mg/dL (ref 65–99)
POTASSIUM: 3.9 mmol/L (ref 3.5–5.3)
SODIUM: 136 mmol/L (ref 135–146)
TOTAL PROTEIN: 8.3 g/dL — AB (ref 6.1–8.1)
Total Bilirubin: 0.5 mg/dL (ref 0.2–1.2)

## 2015-10-28 LAB — CBC WITH DIFFERENTIAL/PLATELET
BASOS ABS: 0 {cells}/uL (ref 0–200)
Basophils Relative: 0 %
EOS ABS: 79 {cells}/uL (ref 15–500)
EOS PCT: 1 %
HCT: 39.8 % (ref 35.0–45.0)
Hemoglobin: 13.8 g/dL (ref 11.7–15.5)
LYMPHS PCT: 31 %
Lymphs Abs: 2449 cells/uL (ref 850–3900)
MCH: 34.2 pg — AB (ref 27.0–33.0)
MCHC: 34.7 g/dL (ref 32.0–36.0)
MCV: 98.5 fL (ref 80.0–100.0)
MONOS PCT: 6 %
MPV: 10 fL (ref 7.5–12.5)
Monocytes Absolute: 474 cells/uL (ref 200–950)
NEUTROS ABS: 4898 {cells}/uL (ref 1500–7800)
NEUTROS PCT: 62 %
PLATELETS: 298 10*3/uL (ref 140–400)
RBC: 4.04 MIL/uL (ref 3.80–5.10)
RDW: 13.4 % (ref 11.0–15.0)
WBC: 7.9 10*3/uL (ref 3.8–10.8)

## 2015-10-28 LAB — TSH: TSH: 0.69 m[IU]/L

## 2015-10-28 NOTE — Progress Notes (Signed)
Patient ID: Heather Horton, female    DOB: 1946-01-01, 70 y.o.   MRN: [id number]  PCP: [name], PA-C  Subjective:   Chief Complaint  Patient presents with  . Hypertension    pt declined flu shot, depression scale during triage, score 17    HPI Presents for evaluation of HTN. She is accompanied by her daughter who is concerned about her mother's anxiety and depression.  In general, she reports that she is well, and would not have brought up her mood to me if her daughter hadn't.  This is a long-time problem, getting worse since her move here from [place] about 5 years ago.  Per her daughter, she is too nervous to drive, is anxious even when riding, and generally declines invitations to go anywhere. Relies on her son and daughter to run all her errands, and chooses to stay at home rather than visit her friends or do anything that she used to enjoy. Her daughter states that she is withdrawing, isolating herself from people. The patient states that perhaps she is nervious about car travel because of all that she has seen on TV.  She is tolerating her medications. No adverse effects.   Review of Systems  Constitutional: Positive for fatigue.  HENT: Negative for sore throat.   Eyes: Negative for visual disturbance.  Respiratory: Negative for cough, chest tightness, shortness of breath and wheezing.   Cardiovascular: Negative for chest pain and palpitations.  Gastrointestinal: Negative for abdominal pain, diarrhea, nausea and vomiting.  Genitourinary: Negative for dysuria, frequency, hematuria and urgency.  Musculoskeletal: Negative for arthralgias and myalgias.  Skin: Negative for rash.  Neurological: Negative for dizziness, weakness and headaches.  Psychiatric/Behavioral: Positive for dysphoric mood and sleep disturbance. Negative for decreased concentration, self-injury and suicidal ideas. The patient is nervous/anxious.        Patient Active Problem List   Diagnosis Date  Noted  . Generalized anxiety disorder 07/22/2015  . Liver fibrosis ([place]) 04/17/2015  . Family history of colon cancer 12/17/2014  . History of colonic polyps 12/17/2014  . Fibromyalgia 11/28/2014  . Need for pneumococcal vaccination 11/28/2014  . Rheumatoid arthritis ([place]) 10/17/2014  . Esophageal reflux 10/17/2014  . Chronic hepatitis C without hepatic coma ([place]) 10/17/2014  . Benign essential HTN 10/17/2014  . Smoker 10/17/2014  . Immune to hepatitis B 10/17/2014  . Need for hepatitis A vaccination 10/17/2014  . Chronic constipation   . Fibrocystic breast disease      Prior to Admission medications   Medication Sig Start Date End Date Taking? Authorizing Provider  albuterol (PROVENTIL HFA;VENTOLIN HFA) 108 (90 Base) MCG/ACT inhaler Inhale 2 puffs into the lungs every 6 (six) hours as needed for wheezing. 04/29/15  Yes Nekia Maxham, PA-C  amLODipine (NORVASC) 5 MG tablet Take 1 tablet (5 mg total) by mouth daily. 04/29/15  Yes Jilleen Essner, PA-C  Elbasvir-Grazoprevir (ZEPATIER) 50-100 MG TABS Take by mouth. Reported on 07/22/2015   Yes Historical Provider, MD  fexofenadine (ALLEGRA) 180 MG tablet Take 180 mg by mouth daily.   Yes Historical Provider, MD  losartan-hydrochlorothiazide (HYZAAR) 100-25 MG tablet Take 1 tablet by mouth daily. 12/17/14  Yes Andrei Mccook, PA-C  Multiple Vitamins-Minerals (MULTIVITAMIN WITH MINERALS) tablet Take 1 tablet by mouth daily.   Yes Historical Provider, MD  omeprazole (PRILOSEC) 40 MG capsule Take 1 capsule (40 mg total) by mouth daily. 10/18/14  Yes Emilyann Banka, PA-C  tiZANidine (ZANAFLEX) 4 MG capsule Take 8 mg by mouth once. Takes 2 at  bedtime   Yes Historical Provider, MD  traMADol (ULTRAM) 50 MG tablet Take 50 mg by mouth 2 (two) times daily. Take two tablets by mouth twice a day as needed   Yes Historical Provider, MD  traZODone (DESYREL) 100 MG tablet TAKE 2 TABLETS BY MOUTH AT BEDTIME AS NEEDED FOR SLEEP 10/21/15  Yes Montie Swiderski, PA-C   zoster vaccine live, PF, (ZOSTAVAX) 16109 UNT/0.65ML injection Inject 19,400 Units into the skin once. Patient not taking: Reported on 10/28/2015 10/17/14   [name], PA-C     Allergies  Allergen Reactions  . Influenza Vaccines Other (See Comments)  . Molds & Smuts Other (See Comments)    Per allergy test  . Pollen Extract Other (See Comments)    Per allergy test  . Aspirin Nausea And Vomiting and Rash       Objective:  Physical Exam  Constitutional: She is oriented to person, place, and time. She appears well-developed and well-nourished. She is active and cooperative. No distress.  BP 126/80 (BP Location: Left Arm, Patient Position: Sitting, Cuff Size: Normal)   Pulse 89   Temp 98.4 F (36.9 C) (Oral)   Resp 16   Ht 5' 4.5" (1.638 m)   Wt 181 lb 9.6 oz (82.4 kg)   SpO2 99%   BMI 30.69 kg/m   HENT:  Head: Normocephalic and atraumatic.  Right Ear: Hearing normal.  Left Ear: Hearing normal.  Eyes: Conjunctivae are normal. No scleral icterus.  Neck: Normal range of motion. Neck supple. No thyromegaly present.  Cardiovascular: Normal rate, regular rhythm and normal heart sounds.   Pulses:      Radial pulses are 2+ on the right side, and 2+ on the left side.  Pulmonary/Chest: Effort normal and breath sounds normal.  Lymphadenopathy:       Head (right side): No tonsillar, no preauricular, no posterior auricular and no occipital adenopathy present.       Head (left side): No tonsillar, no preauricular, no posterior auricular and no occipital adenopathy present.    She has no cervical adenopathy.       Right: No supraclavicular adenopathy present.       Left: No supraclavicular adenopathy present.  Neurological: She is alert and oriented to person, place, and time. No sensory deficit.  Skin: Skin is warm, dry and intact. No rash noted. No cyanosis or erythema. Nails show no clubbing.  Psychiatric: Her speech is normal and behavior is normal. Judgment and thought content  normal. Her mood appears not anxious. Her affect is not angry, not blunt, not labile and not inappropriate. Cognition and memory are normal. She exhibits a depressed mood.           Assessment & Plan:   1. Benign essential HTN Controlled. Stable. Continue current treatment. - CBC with Differential/Platelet - Comprehensive metabolic panel - TSH  2. Need for pneumococcal vaccination - Pneumococcal polysaccharide vaccine 23-valent greater than or equal to 2yo subcutaneous/IM  3. Chronic hepatitis C without hepatic coma (HCC) This may contribute to her fatigue and dysphoric mood. Discuss with Dr. Linus Salmons at her next visit. She'll check on the coverage for Hep A vaccine, and may return at her convenience for dose #2. - Hepatitis A vaccine adult IM; Future  4. Need for hepatitis A vaccination See above. - Hepatitis A vaccine adult IM; Future  5. Anxiety and depression Unclear why she's experiencing this. Is willing to try stretching herself to go on one errand and do one fun thing each  week. Counseling may uncover the source of her anxiety and depression. Consider trazodone.  6. Other fatigue Possibly due to Hep C treatment. More likely due to sleep deprivation, anxiety and depression. - CBC with Differential/Platelet - Comprehensive metabolic panel - TSH  Return in about 3 months (around 01/28/2016).    Fara Chute, PA-C Physician Assistant-Certified [facility]

## 2015-10-28 NOTE — Patient Instructions (Addendum)
Please go on one errand each week, and do one thing just for fun each week.    IF you received an x-ray today, you will receive an invoice from [facility]. Please contact [facility] at [phone] with questions or concerns regarding your invoice.   IF you received labwork today, you will receive an invoice from [employer]. Please contact [facility] at [phone] with questions or concerns regarding your invoice.   Our billing staff will not be able to assist you with questions regarding bills from these companies.  You will be contacted with the lab results as soon as they are available. The fastest way to get your results is to activate your My Chart account. Instructions are located on the last page of this paperwork. If you have not heard from [place] regarding the results in 2 weeks, please contact this office.

## 2015-11-01 ENCOUNTER — Other Ambulatory Visit: Payer: Self-pay | Admitting: Physician Assistant

## 2015-11-10 ENCOUNTER — Encounter: Payer: Self-pay | Admitting: Internal Medicine

## 2015-11-10 ENCOUNTER — Ambulatory Visit (INDEPENDENT_AMBULATORY_CARE_PROVIDER_SITE_OTHER): Payer: Medicare Other | Admitting: Internal Medicine

## 2015-11-10 VITALS — BP 171/88 | HR 101 | Temp 98.1°F | Wt 182.0 lb

## 2015-11-10 DIAGNOSIS — K7400 Hepatic fibrosis, unspecified: Secondary | ICD-10-CM

## 2015-11-10 DIAGNOSIS — B182 Chronic viral hepatitis C: Secondary | ICD-10-CM

## 2015-11-10 DIAGNOSIS — Z23 Encounter for immunization: Secondary | ICD-10-CM

## 2015-11-10 NOTE — Assessment & Plan Note (Addendum)
SVR 12 today and rtc prn unless positive today Will give hepatitis A #2 today.

## 2015-11-10 NOTE — Progress Notes (Signed)
   Subjective:    Patient ID: Heather Horton, female    DOB: 05-Jan-1946, 70 y.o.   MRN: [id number]  HPI Here for follow up of HCV.  Genotype 1b, no history of treatment and elastography of F2/3.  Also with GERD and on 40 mg of prilosec and started on Zepatier and has completed treatment 4 months ago. Received hepatitis A #1 from her PCP.  Continues to have anxiety and finds it harder to even leave the house. She is nervous about being in a car and has not really been able to drive for the last 2 years due to anxiety.  No SI.     Review of Systems  Constitutional: Negative for fatigue.  Skin: Negative for rash.  Neurological: Negative for light-headedness and headaches.       Objective:   Physical Exam  Constitutional: She appears well-developed and well-nourished. No distress.  Eyes: No scleral icterus.  Cardiovascular: Normal rate, regular rhythm and normal heart sounds.   No murmur heard. Skin: No rash noted.          Assessment & Plan:

## 2015-11-10 NOTE — Assessment & Plan Note (Signed)
Moderate fibrosis but no HCC screening indicated.

## 2015-11-12 LAB — HEPATITIS C RNA QUANTITATIVE: HCV Quantitative: NOT DETECTED IU/mL (ref <15)

## 2015-11-13 ENCOUNTER — Telehealth: Payer: Self-pay | Admitting: *Deleted

## 2015-11-13 DIAGNOSIS — M797 Fibromyalgia: Secondary | ICD-10-CM | POA: Diagnosis not present

## 2015-11-13 DIAGNOSIS — R799 Abnormal finding of blood chemistry, unspecified: Secondary | ICD-10-CM | POA: Diagnosis not present

## 2015-11-13 DIAGNOSIS — B182 Chronic viral hepatitis C: Secondary | ICD-10-CM | POA: Diagnosis not present

## 2015-11-13 NOTE — Telephone Encounter (Signed)
-----   Message from Thayer Headings, MD sent at 11/13/2015 11:01 AM EDT ----- Please let her know her final HCV viral load remains negative so is now considered cured. No follow up needed. thanks

## 2015-11-13 NOTE — Telephone Encounter (Signed)
Patient notified

## 2015-11-26 ENCOUNTER — Telehealth: Payer: Self-pay | Admitting: Physician Assistant

## 2015-11-28 NOTE — Telephone Encounter (Signed)
Pt had LM on VM checking on status of trazodone Rf and also to report on status of her depression. Called her back and she reported that she is really "not doing well with her depression". Her daughter was going to stop by and try to speak w/Chelle, but I explained that Chelle will want to see her and it is important that she come in to see Chelle asap instead of waiting until her Dec 11 appt. Explained same day/wk appts and pt agreed to have her daughter call or come by to set up an appt for next week to bring pt in. Sent in RF of trazodone. Chelle, FYI

## 2016-01-31 ENCOUNTER — Other Ambulatory Visit: Payer: Self-pay | Admitting: Physician Assistant

## 2016-02-01 NOTE — Telephone Encounter (Signed)
09/2015 last ov and lab

## 2016-02-03 ENCOUNTER — Ambulatory Visit: Payer: Medicare Other | Admitting: Physician Assistant

## 2016-02-23 ENCOUNTER — Other Ambulatory Visit: Payer: Self-pay | Admitting: Physician Assistant

## 2016-03-29 ENCOUNTER — Other Ambulatory Visit: Payer: Self-pay | Admitting: Physician Assistant

## 2016-03-29 DIAGNOSIS — I10 Essential (primary) hypertension: Secondary | ICD-10-CM

## 2016-03-29 NOTE — Telephone Encounter (Signed)
Patient notified via My Chart.  Meds ordered this encounter  Medications  . amLODipine (NORVASC) 5 MG tablet    Sig: TAKE 1 TABLET BY MOUTH EVERY DAY    Dispense:  90 tablet    Refill:  3

## 2016-05-03 ENCOUNTER — Other Ambulatory Visit: Payer: Self-pay | Admitting: Physician Assistant

## 2016-05-12 DIAGNOSIS — M79641 Pain in right hand: Secondary | ICD-10-CM | POA: Diagnosis not present

## 2016-05-12 DIAGNOSIS — R799 Abnormal finding of blood chemistry, unspecified: Secondary | ICD-10-CM | POA: Diagnosis not present

## 2016-05-12 DIAGNOSIS — M79642 Pain in left hand: Secondary | ICD-10-CM | POA: Diagnosis not present

## 2016-05-12 DIAGNOSIS — M797 Fibromyalgia: Secondary | ICD-10-CM | POA: Diagnosis not present

## 2016-11-10 DIAGNOSIS — R062 Wheezing: Secondary | ICD-10-CM | POA: Diagnosis not present

## 2016-11-10 DIAGNOSIS — Z6828 Body mass index (BMI) 28.0-28.9, adult: Secondary | ICD-10-CM | POA: Diagnosis not present

## 2016-11-10 DIAGNOSIS — Z0000 Encounter for general adult medical examination without abnormal findings: Secondary | ICD-10-CM | POA: Diagnosis not present

## 2016-11-10 DIAGNOSIS — I10 Essential (primary) hypertension: Secondary | ICD-10-CM | POA: Diagnosis not present

## 2016-11-10 DIAGNOSIS — R69 Illness, unspecified: Secondary | ICD-10-CM | POA: Diagnosis not present

## 2016-11-10 DIAGNOSIS — M545 Low back pain: Secondary | ICD-10-CM | POA: Diagnosis not present

## 2016-11-26 ENCOUNTER — Telehealth: Payer: Self-pay

## 2016-11-26 NOTE — Telephone Encounter (Signed)
Called pt to schedule Medicare Annual Wellness Visit as well as follow up with PCP per last note in chart.    Josepha Pigg, B.A.  Care Guide [phone]

## 2017-03-11 ENCOUNTER — Other Ambulatory Visit: Payer: Self-pay | Admitting: Physician Assistant

## 2017-03-11 DIAGNOSIS — I10 Essential (primary) hypertension: Secondary | ICD-10-CM

## 2017-03-11 MED ORDER — LOSARTAN POTASSIUM-HCTZ 100-25 MG PO TABS: 1.0000 | ORAL_TABLET | Freq: Every day | ORAL | 0 refills | Status: DC

## 2017-03-11 NOTE — Telephone Encounter (Signed)
Pts last visit 10/28/2015-Refill req for Amlodipine Advised Jan 2018 she needed OV. Spoke w/pt  - CPE appt made for 04/08/2017 @3 :40pm Pt also needs Losartan refilled Advised we will send in 30 days and Chelle will refill at CPE

## 2017-03-11 NOTE — Telephone Encounter (Signed)
Last OV 03/19/16.  Was notified on 11/26/16 about needing OV but do not see where an OV visit has occurred or been scheduled.

## 2017-03-20 ENCOUNTER — Inpatient Hospital Stay (HOSPITAL_COMMUNITY): Payer: Medicare HMO

## 2017-03-20 ENCOUNTER — Emergency Department (HOSPITAL_COMMUNITY): Payer: Medicare HMO

## 2017-03-20 ENCOUNTER — Other Ambulatory Visit: Payer: Self-pay

## 2017-03-20 ENCOUNTER — Encounter (HOSPITAL_COMMUNITY): Payer: Self-pay | Admitting: Pharmacy Technician

## 2017-03-20 ENCOUNTER — Inpatient Hospital Stay (HOSPITAL_COMMUNITY)
Admission: EM | Admit: 2017-03-20 | Discharge: 2017-03-25 | DRG: 070 | Disposition: A | Discharge status: Home / Self Care | Payer: Medicare HMO | Attending: Internal Medicine | Admitting: Internal Medicine

## 2017-03-20 DIAGNOSIS — K740 Hepatic fibrosis: Secondary | ICD-10-CM | POA: Diagnosis not present

## 2017-03-20 DIAGNOSIS — Z86010 Personal history of colonic polyps: Secondary | ICD-10-CM

## 2017-03-20 DIAGNOSIS — Z8541 Personal history of malignant neoplasm of cervix uteri: Secondary | ICD-10-CM

## 2017-03-20 DIAGNOSIS — Z79899 Other long term (current) drug therapy: Secondary | ICD-10-CM

## 2017-03-20 DIAGNOSIS — F411 Generalized anxiety disorder: Secondary | ICD-10-CM | POA: Diagnosis present

## 2017-03-20 DIAGNOSIS — N179 Acute kidney failure, unspecified: Secondary | ICD-10-CM | POA: Diagnosis present

## 2017-03-20 DIAGNOSIS — R402441 Other coma, without documented Glasgow coma scale score, or with partial score reported, in the field [EMT or ambulance]: Secondary | ICD-10-CM | POA: Diagnosis not present

## 2017-03-20 DIAGNOSIS — M6282 Rhabdomyolysis: Secondary | ICD-10-CM | POA: Diagnosis present

## 2017-03-20 DIAGNOSIS — R569 Unspecified convulsions: Secondary | ICD-10-CM | POA: Diagnosis present

## 2017-03-20 DIAGNOSIS — I10 Essential (primary) hypertension: Secondary | ICD-10-CM | POA: Diagnosis present

## 2017-03-20 DIAGNOSIS — G894 Chronic pain syndrome: Secondary | ICD-10-CM | POA: Diagnosis not present

## 2017-03-20 DIAGNOSIS — F17210 Nicotine dependence, cigarettes, uncomplicated: Secondary | ICD-10-CM | POA: Diagnosis present

## 2017-03-20 DIAGNOSIS — J9601 Acute respiratory failure with hypoxia: Secondary | ICD-10-CM | POA: Diagnosis present

## 2017-03-20 DIAGNOSIS — Z79891 Long term (current) use of opiate analgesic: Secondary | ICD-10-CM | POA: Diagnosis not present

## 2017-03-20 DIAGNOSIS — G9341 Metabolic encephalopathy: Principal | ICD-10-CM | POA: Diagnosis present

## 2017-03-20 DIAGNOSIS — Z4682 Encounter for fitting and adjustment of non-vascular catheter: Secondary | ICD-10-CM | POA: Diagnosis not present

## 2017-03-20 DIAGNOSIS — J9690 Respiratory failure, unspecified, unspecified whether with hypoxia or hypercapnia: Secondary | ICD-10-CM | POA: Diagnosis not present

## 2017-03-20 DIAGNOSIS — E876 Hypokalemia: Secondary | ICD-10-CM | POA: Diagnosis present

## 2017-03-20 DIAGNOSIS — M797 Fibromyalgia: Secondary | ICD-10-CM | POA: Diagnosis not present

## 2017-03-20 DIAGNOSIS — Z8543 Personal history of malignant neoplasm of ovary: Secondary | ICD-10-CM

## 2017-03-20 DIAGNOSIS — E785 Hyperlipidemia, unspecified: Secondary | ICD-10-CM | POA: Diagnosis present

## 2017-03-20 DIAGNOSIS — R69 Illness, unspecified: Secondary | ICD-10-CM | POA: Diagnosis not present

## 2017-03-20 DIAGNOSIS — G4700 Insomnia, unspecified: Secondary | ICD-10-CM | POA: Diagnosis present

## 2017-03-20 DIAGNOSIS — T40601A Poisoning by unspecified narcotics, accidental (unintentional), initial encounter: Secondary | ICD-10-CM | POA: Diagnosis present

## 2017-03-20 DIAGNOSIS — R4182 Altered mental status, unspecified: Secondary | ICD-10-CM | POA: Diagnosis not present

## 2017-03-20 DIAGNOSIS — R4020 Unspecified coma: Secondary | ICD-10-CM | POA: Diagnosis not present

## 2017-03-20 DIAGNOSIS — K219 Gastro-esophageal reflux disease without esophagitis: Secondary | ICD-10-CM | POA: Diagnosis present

## 2017-03-20 DIAGNOSIS — G9340 Encephalopathy, unspecified: Secondary | ICD-10-CM

## 2017-03-20 DIAGNOSIS — B1920 Unspecified viral hepatitis C without hepatic coma: Secondary | ICD-10-CM | POA: Diagnosis present

## 2017-03-20 DIAGNOSIS — E861 Hypovolemia: Secondary | ICD-10-CM | POA: Diagnosis present

## 2017-03-20 DIAGNOSIS — Z90710 Acquired absence of both cervix and uterus: Secondary | ICD-10-CM

## 2017-03-20 DIAGNOSIS — N6019 Diffuse cystic mastopathy of unspecified breast: Secondary | ICD-10-CM | POA: Diagnosis present

## 2017-03-20 DIAGNOSIS — G43909 Migraine, unspecified, not intractable, without status migrainosus: Secondary | ICD-10-CM | POA: Diagnosis present

## 2017-03-20 DIAGNOSIS — F419 Anxiety disorder, unspecified: Secondary | ICD-10-CM | POA: Diagnosis not present

## 2017-03-20 HISTORY — DX: Respiratory failure, unspecified, unspecified whether with hypoxia or hypercapnia: J96.90

## 2017-03-20 LAB — CBC WITH DIFFERENTIAL/PLATELET
Basophils Absolute: 0 10*3/uL (ref 0.0–0.1)
Basophils Relative: 0 %
EOS PCT: 0 %
Eosinophils Absolute: 0 10*3/uL (ref 0.0–0.7)
HCT: 41.3 % (ref 36.0–46.0)
Hemoglobin: 14.6 g/dL (ref 12.0–15.0)
Lymphocytes Relative: 7 %
Lymphs Abs: 0.8 10*3/uL (ref 0.7–4.0)
MCH: 35.9 pg — ABNORMAL HIGH (ref 26.0–34.0)
MCHC: 35.4 g/dL (ref 30.0–36.0)
MCV: 101.5 fL — ABNORMAL HIGH (ref 78.0–100.0)
MONO ABS: 0.6 10*3/uL (ref 0.1–1.0)
MONOS PCT: 6 %
Neutro Abs: 9.5 10*3/uL — ABNORMAL HIGH (ref 1.7–7.7)
Neutrophils Relative %: 87 %
PLATELETS: 282 10*3/uL (ref 150–400)
RBC: 4.07 MIL/uL (ref 3.87–5.11)
RDW: 12.4 % (ref 11.5–15.5)
WBC: 10.9 10*3/uL — ABNORMAL HIGH (ref 4.0–10.5)

## 2017-03-20 LAB — URINALYSIS, ROUTINE W REFLEX MICROSCOPIC
Bilirubin Urine: NEGATIVE
GLUCOSE, UA: NEGATIVE mg/dL
KETONES UR: 5 mg/dL — AB
LEUKOCYTES UA: NEGATIVE
NITRITE: NEGATIVE
PH: 5 (ref 5.0–8.0)
Protein, ur: 100 mg/dL — AB
Specific Gravity, Urine: 1.019 (ref 1.005–1.030)
Squamous Epithelial / LPF: NONE SEEN

## 2017-03-20 LAB — I-STAT CG4 LACTIC ACID, ED: LACTIC ACID, VENOUS: 4.29 mmol/L — AB (ref 0.5–1.9)

## 2017-03-20 LAB — COMPREHENSIVE METABOLIC PANEL
ALBUMIN: 3.5 g/dL (ref 3.5–5.0)
ALK PHOS: 56 U/L (ref 38–126)
ALT: 18 U/L (ref 14–54)
ALT: 18 U/L (ref 14–54)
ANION GAP: 14 (ref 5–15)
ANION GAP: 13 (ref 5–15)
AST: 43 U/L — ABNORMAL HIGH (ref 15–41)
AST: 41 U/L (ref 15–41)
Albumin: 3.2 g/dL — ABNORMAL LOW (ref 3.5–5.0)
Alkaline Phosphatase: 61 U/L (ref 38–126)
BILIRUBIN TOTAL: 0.9 mg/dL (ref 0.3–1.2)
BILIRUBIN TOTAL: 0.6 mg/dL (ref 0.3–1.2)
BUN: 18 mg/dL (ref 6–20)
BUN: 19 mg/dL (ref 6–20)
CALCIUM: 8.6 mg/dL — AB (ref 8.9–10.3)
CHLORIDE: 104 mmol/L (ref 101–111)
CO2: 19 mmol/L — ABNORMAL LOW (ref 22–32)
CO2: 19 mmol/L — ABNORMAL LOW (ref 22–32)
Calcium: 9.1 mg/dL (ref 8.9–10.3)
Chloride: 103 mmol/L (ref 101–111)
Creatinine, Ser: 1.04 mg/dL — ABNORMAL HIGH (ref 0.44–1.00)
Creatinine, Ser: 0.88 mg/dL (ref 0.44–1.00)
GFR calc Af Amer: >60 mL/min (ref >60)
GFR, EST NON AFRICAN AMERICAN: 53 mL/min — AB (ref >60)
GLUCOSE: 106 mg/dL — AB (ref 65–99)
GLUCOSE: 104 mg/dL — AB (ref 65–99)
POTASSIUM: 4.6 mmol/L (ref 3.5–5.1)
POTASSIUM: 3.9 mmol/L (ref 3.5–5.1)
Sodium: 137 mmol/L (ref 135–145)
Sodium: 135 mmol/L (ref 135–145)
TOTAL PROTEIN: 7.2 g/dL (ref 6.5–8.1)
TOTAL PROTEIN: 6.7 g/dL (ref 6.5–8.1)

## 2017-03-20 LAB — GLUCOSE, CAPILLARY: GLUCOSE-CAPILLARY: 97 mg/dL (ref 65–99)

## 2017-03-20 LAB — I-STAT CHEM 8, ED
BUN: 28 mg/dL — ABNORMAL HIGH (ref 6–20)
Calcium, Ion: 1.07 mmol/L — ABNORMAL LOW (ref 1.15–1.40)
Chloride: 106 mmol/L (ref 101–111)
Creatinine, Ser: 0.8 mg/dL (ref 0.44–1.00)
Glucose, Bld: 102 mg/dL — ABNORMAL HIGH (ref 65–99)
HCT: 47 % — ABNORMAL HIGH (ref 36.0–46.0)
HEMOGLOBIN: 16 g/dL — AB (ref 12.0–15.0)
POTASSIUM: 5 mmol/L (ref 3.5–5.1)
Sodium: 137 mmol/L (ref 135–145)
TCO2: 23 mmol/L (ref 22–32)

## 2017-03-20 LAB — I-STAT ARTERIAL BLOOD GAS, ED
Acid-base deficit: 5 mmol/L — ABNORMAL HIGH (ref 0.0–2.0)
Bicarbonate: 20 mmol/L (ref 20.0–28.0)
O2 Saturation: 99 %
PCO2 ART: 35.5 mmHg (ref 32.0–48.0)
PO2 ART: 169 mmHg — AB (ref 83.0–108.0)
Patient temperature: 97.3
TCO2: 21 mmol/L — AB (ref 22–32)
pH, Arterial: 7.356 (ref 7.350–7.450)

## 2017-03-20 LAB — LIPASE, BLOOD: LIPASE: 42 U/L (ref 11–51)

## 2017-03-20 LAB — PROTIME-INR
INR: 1.08
INR: 1.12
Prothrombin Time: 13.9 seconds (ref 11.4–15.2)
Prothrombin Time: 14.3 seconds (ref 11.4–15.2)

## 2017-03-20 LAB — MAGNESIUM
MAGNESIUM: 2.1 mg/dL (ref 1.7–2.4)
Magnesium: 2.2 mg/dL (ref 1.7–2.4)

## 2017-03-20 LAB — RAPID URINE DRUG SCREEN, HOSP PERFORMED
Amphetamines: NOT DETECTED
BARBITURATES: NOT DETECTED
Benzodiazepines: NOT DETECTED
Cocaine: NOT DETECTED
Opiates: DETECTED — AB
Tetrahydrocannabinol: NOT DETECTED

## 2017-03-20 LAB — AMYLASE: Amylase: 108 U/L — ABNORMAL HIGH (ref 28–100)

## 2017-03-20 LAB — ETHANOL

## 2017-03-20 LAB — TROPONIN I
Troponin I: <0.03 ng/mL (ref <0.03)
Troponin I: <0.03 ng/mL (ref <0.03)

## 2017-03-20 LAB — CORTISOL: CORTISOL PLASMA: 29.5 ug/dL

## 2017-03-20 LAB — PHOSPHORUS: PHOSPHORUS: 3.1 mg/dL (ref 2.5–4.6)

## 2017-03-20 LAB — MRSA PCR SCREENING: MRSA BY PCR: POSITIVE — AB

## 2017-03-20 LAB — CBC
HEMATOCRIT: 39.2 % (ref 36.0–46.0)
Hemoglobin: 13.7 g/dL (ref 12.0–15.0)
MCH: 35.4 pg — ABNORMAL HIGH (ref 26.0–34.0)
MCHC: 34.9 g/dL (ref 30.0–36.0)
MCV: 101.3 fL — ABNORMAL HIGH (ref 78.0–100.0)
PLATELETS: 279 10*3/uL (ref 150–400)
RBC: 3.87 MIL/uL (ref 3.87–5.11)
RDW: 12.7 % (ref 11.5–15.5)
WBC: 11.2 10*3/uL — AB (ref 4.0–10.5)

## 2017-03-20 LAB — STREP PNEUMONIAE URINARY ANTIGEN: Strep Pneumo Urinary Antigen: NEGATIVE

## 2017-03-20 LAB — PROCALCITONIN: Procalcitonin: 3.47 ng/mL

## 2017-03-20 LAB — APTT: aPTT: 34 seconds (ref 24–36)

## 2017-03-20 LAB — CBG MONITORING, ED: GLUCOSE-CAPILLARY: 98 mg/dL (ref 65–99)

## 2017-03-20 LAB — CK: Total CK: 1528 U/L — ABNORMAL HIGH (ref 38–234)

## 2017-03-20 MED ORDER — PANTOPRAZOLE SODIUM 40 MG IV SOLR
40.0000 mg | Freq: Every day | INTRAVENOUS | Status: DC
  Administered 2017-03-20 – 2017-03-22 (×3): 40 mg via INTRAVENOUS
  Filled 2017-03-20 (×3): qty 40

## 2017-03-20 MED ORDER — PROPOFOL 1000 MG/100ML IV EMUL
5.0000 ug/kg/min | Freq: Once | INTRAVENOUS | Status: DC
  Administered 2017-03-20: 5 ug/kg/min via INTRAVENOUS

## 2017-03-20 MED ORDER — PROPOFOL 1000 MG/100ML IV EMUL
INTRAVENOUS | Status: AC
  Filled 2017-03-20: qty 100

## 2017-03-20 MED ORDER — PROPOFOL 1000 MG/100ML IV EMUL
5.0000 ug/kg/min | INTRAVENOUS | Status: DC
  Administered 2017-03-20 – 2017-03-21 (×2): 40 ug/kg/min via INTRAVENOUS
  Administered 2017-03-21 (×2): 20 ug/kg/min via INTRAVENOUS
  Administered 2017-03-22 (×2): 40 ug/kg/min via INTRAVENOUS
  Filled 2017-03-20 (×7): qty 100

## 2017-03-20 MED ORDER — SODIUM CHLORIDE 0.9 % IV SOLN: 250.0000 mL | INTRAVENOUS | Status: DC | PRN

## 2017-03-20 MED ORDER — PROPOFOL 10 MG/ML IV BOLUS
INTRAVENOUS | Status: AC
  Filled 2017-03-20: qty 20

## 2017-03-20 MED ORDER — ORAL CARE MOUTH RINSE
15.0000 mL | Freq: Four times a day (QID) | OROMUCOSAL | Status: DC
  Administered 2017-03-21 – 2017-03-25 (×14): 15 mL via OROMUCOSAL

## 2017-03-20 MED ORDER — LEVETIRACETAM 500 MG/5ML IV SOLN
1000.0000 mg | Freq: Once | INTRAVENOUS | Status: AC
  Administered 2017-03-20: 1000 mg via INTRAVENOUS
  Filled 2017-03-20: qty 10

## 2017-03-20 MED ORDER — MUPIROCIN 2 % EX OINT
TOPICAL_OINTMENT | Freq: Two times a day (BID) | CUTANEOUS | Status: DC
  Administered 2017-03-20 – 2017-03-25 (×10): via NASAL
  Filled 2017-03-20 (×5): qty 22

## 2017-03-20 MED ORDER — CHLORHEXIDINE GLUCONATE CLOTH 2 % EX PADS
6.0000 | MEDICATED_PAD | Freq: Every day | CUTANEOUS | Status: DC
  Administered 2017-03-21: 6 via TOPICAL

## 2017-03-20 MED ORDER — SODIUM CHLORIDE 0.9 % IV SOLN
INTRAVENOUS | Status: DC
  Administered 2017-03-20 – 2017-03-25 (×12): via INTRAVENOUS

## 2017-03-20 MED ORDER — PROPOFOL 1000 MG/100ML IV EMUL
5.0000 ug/kg/min | Freq: Once | INTRAVENOUS | Status: AC
  Administered 2017-03-20: 15 ug/kg/min via INTRAVENOUS
  Filled 2017-03-20: qty 100

## 2017-03-20 MED ORDER — CHLORHEXIDINE GLUCONATE 0.12% ORAL RINSE (MEDLINE KIT)
15.0000 mL | Freq: Two times a day (BID) | OROMUCOSAL | Status: DC
  Administered 2017-03-20 – 2017-03-25 (×8): 15 mL via OROMUCOSAL

## 2017-03-20 MED ORDER — LORAZEPAM 2 MG/ML IJ SOLN
INTRAMUSCULAR | Status: AC
  Administered 2017-03-20: 2 mg
  Filled 2017-03-20: qty 1

## 2017-03-20 NOTE — ED Notes (Signed)
20mg  etomidate given and 100mg  succenylcholine given per MD verbhal order.

## 2017-03-20 NOTE — Progress Notes (Signed)
[facility] Progress Note Patient Name: Heather Horton DOB: June 19, 1945 MRN: [id number]   Date of Service  03/20/2017  HPI/Events of Note  Apparently Neurology was not aware of this consult.   eICU Interventions  I have personally called Dr. Lorraine Lax, the Neurologist on call, and asked him to consult on this patient.      Intervention Category Major Interventions: Change in mental status - evaluation and management Minor Interventions: Communication with other healthcare providers and/or family  Lysle Dingwall 03/20/2017, 9:37 PM

## 2017-03-20 NOTE — Progress Notes (Signed)
Called to bring family back to the room and support them as they hear an update concerning their loved one.  Patient has been intubated.  Patient daughters and granddaughter at bedside.  Chaplain will remain available as needed.    03/20/17 1517  Clinical Encounter Type  Visited With Family;Health care provider  Visit Type Initial;Spiritual support;ED;Critical Care  Spiritual Encounters  Spiritual Needs Emotional

## 2017-03-20 NOTE — Consult Note (Signed)
Neurology Consultation Reason for Consult: AMS, possible seizures Referring Physician: Dr Oletta Darter    History is obtained from: chart, daughter   HPI: Heather Horton is a 72 y.o. female with PMH of HTN, HLD, hep C, cervical cancer, bronchitis who wa found down earlier today by her daughter unresponsive.  According to her daughter the patient was last seen normal on Friday, however text of her on Saturday around 2 PM. The daughter did not hear from her mother this morning which is unusual and went to see her at 1 PM. She was on the floor, it appears she had hit her head against a glass. She was unresponsive and had her arms clenched. She called 911 because she is concerned her mother had a stroke. On arrival by EMS there is questionable seizure-like activity with some rhythmic movements of her arms moaning.     LKW: Saturday 2 pm  tpa given?: no, outside window, unclear if stroke   ROS:  Unable to obtain due to altered mental status.   Past Medical History:  Diagnosis Date  . Allergy   . Arthritis   . Bronchitis   . Bursitis   . Cervical cancer ([place])   . Chronic constipation   . Colon polyps   . Fibrocystic breast disease   . Galactorrhea on right side   . Hepatitis C   . Hyperlipidemia   . Hypertension   . Insomnia      Family History  Problem Relation Age of Onset  . Asthma Mother   . Colon cancer Sister 53  . Stroke Brother 45  . Stroke Sister 64  . Arthritis Daughter   . Pulmonary embolism Daughter 54       on chronic warfarin  . Hypertension Son 68     Social History:  reports that she has been smoking cigarettes.  She has a 40.00 pack-year smoking history. she has never used smokeless tobacco. She reports that she drinks about 8.4 - 10.8 oz of alcohol per week. She reports that she does not use drugs.   Exam: Current vital signs: BP 96/65   Pulse 96   Temp 98.4 F (36.9 C)   Resp 15   Ht 5\' 6"  (1.676 m)   Wt 90.7 kg (200 lb)   SpO2 100%   BMI 32.28 kg/m   Vital signs in last 24 hours: Temp:  [97.3 F (36.3 C)-98.4 F (36.9 C)] 98.4 F (36.9 C) (01/20 2200) Pulse Rate:  [88-108] 96 (01/20 2200) Resp:  [11-24] 15 (01/20 2200) BP: (91-152)/(50-88) 96/65 (01/20 2200) SpO2:  [99 %-100 %] 100 % (01/20 2200) FiO2 (%):  [40 %-80 %] 80 % (01/20 2024) Weight:  [90.7 kg (200 lb)] 90.7 kg (200 lb) (01/20 1503)   Physical Exam  Constitutional: Appears well-developed and well-nourished.  Psych: Affect appropriate to situation Eyes: No scleral injection HENT: No OP obstrucion Head: Normocephalic.  Cardiovascular: Normal rate and regular rhythm.  Respiratory: Effort normal, non-labored breathing GI: Soft.  No distension. There is no tenderness.  Skin: WDI  Neuro: Mental Status: Patient intubated, on sedation.  Examination performed after propofol turned off for 15 min Patient opens eyes to painful stimuli, non verbal and not following commands Cranial Nerves: Pupils are equal 41mm bilaterally and reactive EOMI: Oculocephalic reflex intact Face appears symmetric Corneal reflex intact Gag reflex intact Motor: Tone is normal. Bulk is normal. Withdraws in 4 extremities and grimaces to pain Sensory: unable to accurately assess, withdraws equally on both sides Deep  Tendon Reflexes: 2+ and symmetric in the biceps and patellae.  Plantars: Toes are upgoing bilaterally Cerebellar: Unable to assess   I have reviewed labs in epic and the results pertinent to this consultation are:   I have reviewed the images obtained:CT head showed no acute abnormality   ASSESSMENT AND PLAN   72 y.o. female with PMH of HTN, HLD, hep C, cervical cancer, bronchitis who wa found down earlier today by her daughter unresponsive.  Found to be posturing with possible seizure like activity. She was also noted to be grunting and aspirin for air  She was intubated in the emergency room sternum Keppra for possible seizures. She was also started on sedation. Patient  admitted to [facility] CM and neurology was consulted after patient was in the ICU .  Acute Encephalopathy- patient found down unresponsive Due to Seizures vs Stroke vs Anoxic injury   Examined patient off propofol, withdrawing all 4 extremities about equally and gagging of tube. Is somewhat reassuring. No forced gaze deviation, nystagmus no posturing noted. Will obtain routine EEG tomorrow Will obtain MRI brain with and without contrast as well as MRA head : no acute infarct, No LVO noted  Which is afebrile and has no neck rigidity,  suspicion of meningitis is currently low and will defer LP until MRI brain is performed.  Seizure precautions Continue Keppra, sedation    Will continue to follow

## 2017-03-20 NOTE — Progress Notes (Signed)
RT transported patient from ED to CT to 2M14. No complications. Vitals stable throughout. RN at bedside. Report given to [place], RT. RT will continue to monitor.

## 2017-03-20 NOTE — ED Notes (Signed)
Dr. Sabra Heck notified of elevated CG-4

## 2017-03-20 NOTE — ED Triage Notes (Signed)
Pt arrives via [facility] from home with AMS. Per EMS pt lives alone. Family heard from her yesterday approx 1100. Family found pt down on the floor on her right side. Upon arrival pt with what appears to be focal seizures. MD at bedside upon pt arrival.

## 2017-03-20 NOTE — ED Notes (Signed)
Per CT, pt has 3 in front of her for scan. CCM made aware.

## 2017-03-20 NOTE — Progress Notes (Signed)
Dr.Aroor contacted for Neurology On call about seeing patient and talking with family. He is going to follow-up on consult because he was not aware.

## 2017-03-20 NOTE — H&P (Signed)
PULMONARY / CRITICAL CARE MEDICINE   Name: Heather Horton MRN: [id number] DOB: March 27, 1945    ADMISSION DATE:  03/20/2017 None available  REFERRING MD: Emergency department physician  CHIEF COMPLAINT: Altered mental status respiratory failure  HISTORY OF PRESENT ILLNESS:   72 year old female with known history of chronic pain from degenerative joint disease and arthritis bursitis.  She was last seen normal on 03/18/2017 by her daughter.  Subsequently on 03/20/2017 she was found by the daughter on the floor breathing color is good but unable to follow commands and gasping and grunting for air. Emergency medical service was activated she was transferred to the [facility] emergency department. Of note left IJ and right IJ central lines were attempted without success.  She had a right and femoral central line placed per the emergency department physician. At the time of this dictation she has not had a CT scan of the head nor has neurology been consulted.  Note her capillary blood glucose was 98 on admission.  Further labs are pending at this time she will be admitted to the intensive care unit and will need evaluation by neurology and evaluation of her CT scan of the head.  PAST MEDICAL HISTORY :  She  has a past medical history of Allergy, Arthritis, Bronchitis, Bursitis, Cervical cancer ([place]), Chronic constipation, Colon polyps, Fibrocystic breast disease, Galactorrhea on right side, Hepatitis C, Hyperlipidemia, Hypertension, and Insomnia.  PAST SURGICAL HISTORY: She  has a past surgical history that includes Appendectomy and Abdominal hysterectomy (30's).  Allergies  Allergen Reactions  . Influenza Vaccines Other (See Comments)  . Molds & Smuts Other (See Comments)    Per allergy test  . Pollen Extract Other (See Comments)    Per allergy test  . Aspirin Nausea And Vomiting and Rash    No current facility-administered medications on file prior to encounter.    Current Outpatient  Medications on File Prior to Encounter  Medication Sig  . albuterol (PROVENTIL HFA;VENTOLIN HFA) 108 (90 Base) MCG/ACT inhaler Inhale 2 puffs into the lungs every 6 (six) hours as needed for wheezing.  Marland Kitchen amLODipine (NORVASC) 5 MG tablet TAKE 1 TABLET BY MOUTH EVERY DAY  . losartan-hydrochlorothiazide (HYZAAR) 100-25 MG tablet Take 1 tablet by mouth daily.  . naproxen sodium (ALEVE) 220 MG tablet Take 220 mg by mouth as needed (pain).  Marland Kitchen omeprazole (PRILOSEC) 40 MG capsule TAKE ONE CAPSULE BY MOUTH EVERY DAY  . tiZANidine (ZANAFLEX) 4 MG capsule Take 8 mg by mouth once. Takes 2 at bedtime  . traMADol (ULTRAM) 50 MG tablet Take 50 mg by mouth 2 (two) times daily. Take two tablets by mouth twice a day as needed  . traZODone (DESYREL) 100 MG tablet TAKE 2 TABLETS BY MOUTH AT BEDTIME AS NEEDED FOR SLEEP  . zoster vaccine live, PF, (ZOSTAVAX) 56979 UNT/0.65ML injection Inject 19,400 Units into the skin once. (Patient not taking: Reported on 10/28/2015)    FAMILY HISTORY:  Her indicated that her mother is deceased. She indicated that her father is deceased. She indicated that three of her four sisters are alive. She indicated that two of her three brothers are alive. She indicated that her maternal grandmother is deceased. She indicated that her maternal grandfather is deceased. She indicated that her paternal grandmother is deceased. She indicated that her paternal grandfather is deceased. She indicated that her daughter is alive. She indicated that her son is alive.   SOCIAL HISTORY: She  reports that she has been smoking cigarettes.  She has a 40.00 pack-year smoking history. she has never used smokeless tobacco. She reports that she drinks about 8.4 - 10.8 oz of alcohol per week. She reports that she does not use drugs.  REVIEW OF SYSTEMS:   Not available  SUBJECTIVE:  72 year old female sedated and intubated with plans of going to CT scan.  VITAL SIGNS: BP (!) 147/85   Pulse 95   Temp (!)  97.3 F (36.3 C) (Rectal)   Resp 14   Ht 5\' 6"  (1.676 m)   Wt 90.7 kg (200 lb)   SpO2 100%   BMI 32.28 kg/m   HEMODYNAMICS:    VENTILATOR SETTINGS: Vent Mode: PRVC FiO2 (%):  [40 %] 40 % Set Rate:  [14 bmp] 14 bmp Vt Set:  [500 mL] 500 mL PEEP:  [5 cmH20] 5 cmH20 Plateau Pressure:  [13 cmH20] 13 cmH20  INTAKE / OUTPUT: No intake/output data recorded.  PHYSICAL EXAMINATION: General: Elderly female intubated sedated Neuro: Moves all extremities x4, does not follow commands, currently is on dipper Van drip. HEENT: No JVD is appreciated, endotracheal tube connected to ventilator Cardiovascular: Heart sounds are regular regular rate and rhythm Lungs: Coarse rhonchi bilaterally Abdomen: Positive bowel sounds nondistended Musculoskeletal: Warm no obvious deformities Skin: Dry and intact  LABS:  BMET No results for input(s): NA, K, CL, CO2, BUN, CREATININE, GLUCOSE in the last 168 hours.  Electrolytes No results for input(s): CALCIUM, MG, PHOS in the last 168 hours.  CBC No results for input(s): WBC, HGB, HCT, PLT in the last 168 hours.  Coag's No results for input(s): APTT, INR in the last 168 hours.  Sepsis Markers No results for input(s): LATICACIDVEN, PROCALCITON, O2SATVEN in the last 168 hours.  ABG No results for input(s): PHART, PCO2ART, PO2ART in the last 168 hours.  Liver Enzymes No results for input(s): AST, ALT, ALKPHOS, BILITOT, ALBUMIN in the last 168 hours.  Cardiac Enzymes No results for input(s): TROPONINI, PROBNP in the last 168 hours.  Glucose Recent Labs  Lab 03/20/17 1415  GLUCAP 98    Imaging Dg Chest Port 1 View  Result Date: 03/20/2017 CLINICAL DATA:  72 year old female status post intubation. EXAM: PORTABLE CHEST 1 VIEW COMPARISON:  Chest radiographs 05/11/2012. FINDINGS: Portable AP supine view at 1435 hours. Endotracheal tube tip in good position just above the level the clavicles. Enteric tube courses to the abdomen, side hole up  the level of the gastric fundus. Mildly low lung volumes. Allowing for portable technique the lungs are clear. Normal cardiac size and mediastinal contours. Negative visible bowel gas pattern. No acute osseous abnormality identified. IMPRESSION: 1. ET tube and enteric tube appropriately placed. 2.  No acute cardiopulmonary abnormality. Electronically Signed   By: Genevie Ann M.D.   On: 03/20/2017 14:53     STUDIES:  03/20/2017 CT of the head pending>>  CULTURES: 03/20/2017 blood cultures x2>> 03/20/2017 sputum culture>> 03/20/2017 urine culture>> 03/20/2017 pro-calcitonin>>  ANTIBIOTICS: None  SIGNIFICANT EVENTS: 03/20/2017 intubated  LINES/TUBES: Right femoral CVL>>  DISCUSSION: 72 year old female with known history of chronic pain from degenerative joint disease and arthritis bursitis.  She was last seen normal on 03/18/2017 by her daughter.  Subsequently on 03/20/2017 she was found by the daughter on the floor breathing color is good but unable to follow commands and gasping and grunting for air. Emergency medical service was activated she was transferred to the [facility] emergency department. Of note left IJ and right IJ central lines were attempted without success.  She  had a right and femoral central line placed per the emergency department physician. At the time of this dictation she has not had a CT scan of the head nor has neurology been consulted.  Note her capillary blood glucose was 98 on admission.  Further labs are pending at this time she will be admitted to the intensive care unit and will need evaluation by neurology and evaluation of her CT scan of the head.  ASSESSMENT / PLAN:  PULMONARY A: Vent dependent respiratory failure secondary to altered mental status Intubated by the EDP P:   Vent bundle Follow chest x-ray   CARDIOVASCULAR A:  History of hypertension P:  Hold antihypertensive for now  RENAL Lab Results  Component Value Date   CREATININE 0.84  10/28/2015   CREATININE 1.89 (H) 05/13/2015   CREATININE 0.78 10/17/2014   No results for input(s): K in the last 168 hours.   A:   Renal insufficiency P:   Avoid nephrotoxic drugs Replete electrolytes as needed  GASTROINTESTINAL A:   History of colon polyps GI protection P:   PPI  HEMATOLOGIC A:   Known history of hepatitis C History of cervical cancer P:  Follow hemoglobin on labs  INFECTIOUS A:   No known infectious process P:   Pan culture Pro-calcitonin No antibiotics at this time  ENDOCRINE CBG (last 3)  Recent Labs    03/20/17 1415  GLUCAP 98    A:   No acute issues currently P:   Follow glucose on labs NEUROLOGIC A:   No onset of altered mental status, last seen 03/18/2017 normal with no acute distress. Found 03/20/2017 on the floor breathing wanting not following commands. Now intubated. P:   RASS goal: -1 Sedation for tube tolerance CT of head Neurology consult   FAMILY  - Updates: 2 daughters updated at the bedside.  - Inter-disciplinary family meet or Palliative Care meeting due by:  day 7  My cct 45 min  Richardson Landry Venida Tsukamoto ACNP Maryanna Shape PCCM Pager [phone] till 1 pm If no answer page [phone] 03/20/2017, 3:46 PM

## 2017-03-20 NOTE — ED Provider Notes (Signed)
[facility] EMERGENCY DEPARTMENT Provider Note   CSN: [id number] Arrival date & time: 03/20/17  1400     History   Chief Complaint Chief Complaint  Patient presents with  . Altered Mental Status    HPI Heather Horton is a 72 y.o. female.  HPI Level 5 caveat due to altered mental status.  Patient presents by EMS after being found by daughter sitting in the floor with rhythmic movements of arms and moaning.  Last seen normal was at 11 AM yesterday.  Patient normally takes care of activities of daily living.  No prior history of seizure.  Daughter states that the table was turned over from where she fell.  CBG was normal en route.   Past Medical History:  Diagnosis Date  . Allergy   . Arthritis   . Bronchitis   . Bursitis   . Cervical cancer ([place])   . Chronic constipation   . Colon polyps   . Fibrocystic breast disease   . Galactorrhea on right side   . Hepatitis C   . Hyperlipidemia   . Hypertension   . Insomnia     Patient Active Problem List   Diagnosis Date Noted  . Generalized anxiety disorder 07/22/2015  . Liver fibrosis 04/17/2015  . Family history of colon cancer 12/17/2014  . History of colonic polyps 12/17/2014  . Fibromyalgia 11/28/2014  . Need for pneumococcal vaccination 11/28/2014  . Rheumatoid arthritis ([place]) 10/17/2014  . Esophageal reflux 10/17/2014  . Chronic hepatitis C without hepatic coma ([place]) 10/17/2014  . Benign essential HTN 10/17/2014  . Smoker 10/17/2014  . Immune to hepatitis B 10/17/2014  . Need for hepatitis A vaccination 10/17/2014  . Chronic constipation   . Fibrocystic breast disease     Past Surgical History:  Procedure Laterality Date  . ABDOMINAL HYSTERECTOMY  30's   due to cervical cancer  . APPENDECTOMY      OB History    No data available       Home Medications    Prior to Admission medications   Medication Sig Start Date End Date Taking? Authorizing Provider  albuterol (PROVENTIL  HFA;VENTOLIN HFA) 108 (90 Base) MCG/ACT inhaler Inhale 2 puffs into the lungs every 6 (six) hours as needed for wheezing. 04/29/15  Yes Jeffery, Chelle, PA-C  amLODipine (NORVASC) 5 MG tablet TAKE 1 TABLET BY MOUTH EVERY DAY 03/11/17  Yes Jeffery, Chelle, PA-C  losartan-hydrochlorothiazide (HYZAAR) 100-25 MG tablet Take 1 tablet by mouth daily. 03/11/17  Yes Jeffery, Chelle, PA-C  naproxen sodium (ALEVE) 220 MG tablet Take 220 mg by mouth as needed (pain).   Yes [provider]  omeprazole (PRILOSEC) 40 MG capsule TAKE ONE CAPSULE BY MOUTH EVERY DAY 02/01/16  Yes Jeffery, Chelle, PA-C  tiZANidine (ZANAFLEX) 4 MG capsule Take 8 mg by mouth once. Takes 2 at bedtime   Yes [provider]  traMADol (ULTRAM) 50 MG tablet Take 50 mg by mouth 2 (two) times daily. Take two tablets by mouth twice a day as needed   Yes [provider]  traZODone (DESYREL) 100 MG tablet TAKE 2 TABLETS BY MOUTH AT BEDTIME AS NEEDED FOR SLEEP 02/25/16  Yes Jeffery, Chelle, PA-C  zoster vaccine live, PF, (ZOSTAVAX) 40981 UNT/0.65ML injection Inject 19,400 Units into the skin once. Patient not taking: Reported on 10/28/2015 10/17/14   [name], PA-C    Family History Family History  Problem Relation Age of Onset  . Asthma Mother   . Colon cancer Sister  20  . Stroke Brother 27  . Stroke Sister 28  . Arthritis Daughter   . Pulmonary embolism Daughter 71       on chronic warfarin  . Hypertension Son 65    Social History Social History   Tobacco Use  . Smoking status: Current Every Day Smoker    Packs/day: 1.00    Years: 40.00    Pack years: 40.00    Types: Cigarettes  . Smokeless tobacco: Never Used  . Tobacco comment: I'm scared of e-cigarettes, interested in patches, tobacco info given 01/10/15  Substance Use Topics  . Alcohol use: Yes    Alcohol/week: 8.4 - 10.8 oz    Types: 14 - 18 Standard drinks or equivalent per week    Comment: I love my brandy, I'm gonna drink a couple of  drinks each evening, it helps me sleep  . Drug use: No     Allergies   Influenza vaccines; Molds & smuts; Pollen extract; and Aspirin   Review of Systems Review of Systems  Unable to perform ROS: Mental status change     Physical Exam Updated Vital Signs BP (!) 147/85   Pulse 95   Temp (!) 97.3 F (36.3 C) (Rectal)   Resp 14   Ht 5\' 6"  (1.676 m)   Wt 90.7 kg (200 lb)   SpO2 100%   BMI 32.28 kg/m   Physical Exam  Constitutional: She appears well-developed and well-nourished.  HENT:  Head: Normocephalic and atraumatic.  Mouth/Throat: Oropharynx is clear and moist.  Patient has small abrasion to the right temporal region.  Grinding teeth.  No obvious intraoral trauma.  Eyes:  Fixed gaze to the right  Neck: Normal range of motion. Neck supple.  Cardiovascular: Normal rate and regular rhythm. Exam reveals no gallop and no friction rub.  No murmur heard. Pulmonary/Chest:  Abnormal gasping breaths.  Lungs clear.  Abdominal: Soft. Bowel sounds are normal. There is no tenderness. There is no rebound and no guarding.  Musculoskeletal: She exhibits no edema or tenderness.  Bilateral upper extremities are contracted and rhythmically jerking.  No lower extremity swelling or asymmetry.  Neurological:  Patient will respond to painful stimuli.  Moving all extremities.  Patient has jerking like motion to bilateral upper extremities.  Moaning.  Skin: Skin is warm and dry. Capillary refill takes less than 2 seconds. No rash noted. No erythema.  Nursing note and vitals reviewed.    ED Treatments / Results  Labs (all labs ordered are listed, but only abnormal results are displayed) Labs Reviewed  CBC WITH DIFFERENTIAL/PLATELET  COMPREHENSIVE METABOLIC PANEL  TROPONIN I  PROTIME-INR  ETHANOL  RAPID URINE DRUG SCREEN, HOSP PERFORMED  MAGNESIUM  URINALYSIS, ROUTINE W REFLEX MICROSCOPIC  CK  I-STAT CG4 LACTIC ACID, ED  I-STAT CHEM 8, ED  CBG MONITORING, ED    EKG  EKG  Interpretation None       Radiology Dg Chest Port 1 View  Result Date: 03/20/2017 CLINICAL DATA:  72 year old female status post intubation. EXAM: PORTABLE CHEST 1 VIEW COMPARISON:  Chest radiographs 05/11/2012. FINDINGS: Portable AP supine view at 1435 hours. Endotracheal tube tip in good position just above the level the clavicles. Enteric tube courses to the abdomen, side hole up the level of the gastric fundus. Mildly low lung volumes. Allowing for portable technique the lungs are clear. Normal cardiac size and mediastinal contours. Negative visible bowel gas pattern. No acute osseous abnormality identified. IMPRESSION: 1. ET tube and enteric tube appropriately placed.  2.  No acute cardiopulmonary abnormality. Electronically Signed   By: Genevie Ann M.D.   On: 03/20/2017 14:53    Procedures .Central Line Date/Time: 03/20/2017 3:21 PM Performed by: Julianne Rice, MD Authorized by: Julianne Rice, MD   Consent:    Consent obtained:  Emergent situation Pre-procedure details:    Hand hygiene: Hand hygiene performed prior to insertion     Sterile barrier technique: All elements of maximal sterile technique followed     Skin preparation:  2% chlorhexidine   Skin preparation agent: Skin preparation agent completely dried prior to procedure   Anesthesia (see MAR for exact dosages):    Anesthesia method:  Local infiltration   Local anesthetic:  Lidocaine 1% w/o epi Procedure details:    Location:  L femoral   Site selection rationale:  Ease of access   Patient position:  Flat   Procedural supplies:  Triple lumen   Catheter size:  8 Fr   Landmarks identified: yes     Number of attempts:  2   Successful placement: yes   Post-procedure details:    Post-procedure:  Dressing applied and line sutured   Assessment:  Blood return through all ports   Patient tolerance of procedure:  Tolerated well, no immediate complications Procedure Name: Intubation Date/Time: 03/20/2017 3:22  PM Performed by: Julianne Rice, MD Pre-anesthesia Checklist: Patient being monitored Oxygen Delivery Method: Ambu bag Preoxygenation: Pre-oxygenation with 100% oxygen Induction Type: Rapid sequence Ventilation: Mask ventilation without difficulty Laryngoscope Size: Glidescope Tube size: 8.0 mm Number of attempts: 1 Placement Confirmation: ETT inserted through vocal cords under direct vision,  Positive ETCO2 and Breath sounds checked- equal and bilateral      (including critical care time)  Medications Ordered in ED Medications  levETIRAcetam (KEPPRA) 1,000 mg in sodium chloride 0.9 % 100 mL IVPB (1,000 mg Intravenous New Bag/Given 03/20/17 1535)  LORazepam (ATIVAN) 2 MG/ML injection (2 mg  Given 03/20/17 1420)  propofol (DIPRIVAN) 1000 MG/100ML infusion (35 mcg/kg/min  90.7 kg Intravenous Rate/Dose Change 03/20/17 1542)   CRITICAL CARE Performed by: Julianne Rice Total critical care time: 45 minutes Critical care time was exclusive of separately billable procedures and treating other patients. Critical care was necessary to treat or prevent imminent or life-threatening deterioration. Critical care was time spent personally by me on the following activities: development of treatment plan with patient and/or surrogate as well as nursing, discussions with consultants, evaluation of patient's response to treatment, examination of patient, obtaining history from patient or surrogate, ordering and performing treatments and interventions, ordering and review of laboratory studies, ordering and review of radiographic studies, pulse oximetry and re-evaluation of patient's condition.  Initial Impression / Assessment and Plan / ED Course  I have reviewed the triage vital signs and the nursing notes.  Pertinent labs & imaging results that were available during my care of the patient were reviewed by me and considered in my medical decision making (see chart for details).     Concern for  status epilepticus.  Difficulty establishing IV.  Given IM Ativan and right femoral central line placed.  Patient was then intubated for airway protection and to complete workup.  Also started patient on Keppra.  Discussed with critical care who will evaluate patient in the emergency department. Neurology to see patient. Final Clinical Impressions(s) / ED Diagnoses   Final diagnoses:  Altered mental status, unspecified altered mental status type    ED Discharge Orders    None       Seren Chaloux,  Shanon Brow, MD 03/20/17 1549

## 2017-03-20 NOTE — Progress Notes (Signed)
Called to room by Neurologist. He had been unaware of the consult until call was made by nursing and Dr. Oletta Darter.  The neurologist did not believe the patient to be in status epilepticus. Requested order for MRI HEAD w/wo contrast as well as MRA head. Will consider EEG following results.   Orders placed.  Kathi Ludwig, MD Pager # [phone]

## 2017-03-21 ENCOUNTER — Inpatient Hospital Stay (HOSPITAL_COMMUNITY): Payer: Medicare HMO

## 2017-03-21 DIAGNOSIS — J9601 Acute respiratory failure with hypoxia: Secondary | ICD-10-CM

## 2017-03-21 DIAGNOSIS — G9340 Encephalopathy, unspecified: Secondary | ICD-10-CM

## 2017-03-21 LAB — CBC WITH DIFFERENTIAL/PLATELET
Basophils Absolute: 0 10*3/uL (ref 0.0–0.1)
Basophils Relative: 0 %
EOS PCT: 0 %
Eosinophils Absolute: 0 10*3/uL (ref 0.0–0.7)
HEMATOCRIT: 36.6 % (ref 36.0–46.0)
HEMOGLOBIN: 12.8 g/dL (ref 12.0–15.0)
LYMPHS ABS: 1.5 10*3/uL (ref 0.7–4.0)
LYMPHS PCT: 12 %
MCH: 35.5 pg — AB (ref 26.0–34.0)
MCHC: 35 g/dL (ref 30.0–36.0)
MCV: 101.4 fL — AB (ref 78.0–100.0)
Monocytes Absolute: 0.7 10*3/uL (ref 0.1–1.0)
Monocytes Relative: 6 %
Neutro Abs: 10.2 10*3/uL — ABNORMAL HIGH (ref 1.7–7.7)
Neutrophils Relative %: 82 %
Platelets: 265 10*3/uL (ref 150–400)
RBC: 3.61 MIL/uL — AB (ref 3.87–5.11)
RDW: 12.8 % (ref 11.5–15.5)
WBC: 12.3 10*3/uL — AB (ref 4.0–10.5)

## 2017-03-21 LAB — URINE CULTURE: Special Requests: NORMAL

## 2017-03-21 LAB — BASIC METABOLIC PANEL
ANION GAP: 13 (ref 5–15)
BUN: 25 mg/dL — ABNORMAL HIGH (ref 6–20)
CALCIUM: 8.8 mg/dL — AB (ref 8.9–10.3)
CO2: 18 mmol/L — ABNORMAL LOW (ref 22–32)
CREATININE: 1.22 mg/dL — AB (ref 0.44–1.00)
Chloride: 105 mmol/L (ref 101–111)
GFR, EST AFRICAN AMERICAN: 51 mL/min — AB (ref >60)
GFR, EST NON AFRICAN AMERICAN: 44 mL/min — AB (ref >60)
Glucose, Bld: 90 mg/dL (ref 65–99)
Potassium: 4.2 mmol/L (ref 3.5–5.1)
SODIUM: 136 mmol/L (ref 135–145)

## 2017-03-21 LAB — AMMONIA: Ammonia: 10 umol/L (ref 9–35)

## 2017-03-21 LAB — GLUCOSE, CAPILLARY
GLUCOSE-CAPILLARY: 80 mg/dL (ref 65–99)
Glucose-Capillary: 86 mg/dL (ref 65–99)
Glucose-Capillary: 106 mg/dL — ABNORMAL HIGH (ref 65–99)

## 2017-03-21 LAB — PHOSPHORUS: PHOSPHORUS: 3.4 mg/dL (ref 2.5–4.6)

## 2017-03-21 LAB — BLOOD GAS, ARTERIAL
Acid-base deficit: 2.7 mmol/L — ABNORMAL HIGH (ref 0.0–2.0)
BICARBONATE: 21 mmol/L (ref 20.0–28.0)
Drawn by: 364961
FIO2: 40
LHR: 14 {breaths}/min
O2 SAT: 98.4 %
PEEP: 5 cmH2O
PH ART: 7.429 (ref 7.350–7.450)
Patient temperature: 98.6
VT: 500 mL
pCO2 arterial: 32.2 mmHg (ref 32.0–48.0)
pO2, Arterial: 134 mmHg — ABNORMAL HIGH (ref 83.0–108.0)

## 2017-03-21 LAB — MAGNESIUM: MAGNESIUM: 2.1 mg/dL (ref 1.7–2.4)

## 2017-03-21 LAB — CK: CK TOTAL: 1692 U/L — AB (ref 38–234)

## 2017-03-21 LAB — TROPONIN I: Troponin I: <0.03 ng/mL (ref <0.03)

## 2017-03-21 LAB — LACTIC ACID, PLASMA: LACTIC ACID, VENOUS: 0.9 mmol/L (ref 0.5–1.9)

## 2017-03-21 MED ORDER — HEPARIN SODIUM (PORCINE) 5000 UNIT/ML IJ SOLN
5000.0000 [IU] | Freq: Three times a day (TID) | INTRAMUSCULAR | Status: DC
  Administered 2017-03-21 – 2017-03-25 (×11): 5000 [IU] via SUBCUTANEOUS
  Filled 2017-03-21 (×13): qty 1

## 2017-03-21 MED ORDER — SODIUM CHLORIDE 0.9% FLUSH: 10.0000 mL | INTRAVENOUS | Status: DC | PRN

## 2017-03-21 MED ORDER — CHLORHEXIDINE GLUCONATE CLOTH 2 % EX PADS
6.0000 | MEDICATED_PAD | Freq: Every day | CUTANEOUS | Status: DC
  Administered 2017-03-21: 6 via TOPICAL

## 2017-03-21 MED ORDER — VITAL HIGH PROTEIN PO LIQD
1000.0000 mL | ORAL | Status: DC
  Administered 2017-03-21: 1000 mL
  Administered 2017-03-22: 09:00:00

## 2017-03-21 MED ORDER — SODIUM CHLORIDE 0.9 % IV SOLN
500.0000 mg | Freq: Two times a day (BID) | INTRAVENOUS | Status: DC
  Administered 2017-03-21 – 2017-03-23 (×6): 500 mg via INTRAVENOUS
  Filled 2017-03-21 (×7): qty 5

## 2017-03-21 MED ORDER — PRO-STAT SUGAR FREE PO LIQD
60.0000 mL | Freq: Three times a day (TID) | ORAL | Status: DC
  Administered 2017-03-21 (×2): 60 mL
  Filled 2017-03-21 (×4): qty 60

## 2017-03-21 MED ORDER — GADOBENATE DIMEGLUMINE 529 MG/ML IV SOLN
20.0000 mL | Freq: Once | INTRAVENOUS | Status: AC | PRN
  Administered 2017-03-21: 19 mL via INTRAVENOUS

## 2017-03-21 MED ORDER — SODIUM CHLORIDE 0.9% FLUSH
10.0000 mL | Freq: Two times a day (BID) | INTRAVENOUS | Status: DC
  Administered 2017-03-21: 10 mL

## 2017-03-21 MED ORDER — WHITE PETROLATUM EX OINT
TOPICAL_OINTMENT | CUTANEOUS | Status: AC
  Administered 2017-03-21: 08:00:00
  Filled 2017-03-21: qty 28.35

## 2017-03-21 NOTE — Progress Notes (Signed)
Initial Nutrition Assessment  DOCUMENTATION CODES:   Obesity unspecified  INTERVENTION:    Vital High Protein at 15 ml/h (360 ml per day)  Pro-stat 60 ml TID  Provides 960 kcal (1248 kcal total with Propofol), 122 gm protein, 301 ml free water daily  NUTRITION DIAGNOSIS:   Inadequate oral intake related to inability to eat as evidenced by NPO status.  GOAL:   Provide needs based on ASPEN/SCCM guidelines  MONITOR:   Vent status, TF tolerance, Labs, I & O's   REASON FOR ASSESSMENT:   Ventilator, Consult Enteral/tube feeding initiation and management  ASSESSMENT:   72 yo female with PMH of bronchitis, HTN, hepatitis C, arthritis, bursitis, chronic constipation, cervical cancer, HLD, colon polyps, HLD who was admitted on 1/20 with AMS, ? seizure, and respiratory failure requiring intubation.  Discussed patient in ICU rounds and with RN today. Patient is currently intubated on ventilator support MV: 10 L/min Temp (24hrs), Avg:99.4 F (37.4 C), Min:97.3 F (36.3 C), Max:100.8 F (38.2 C)  Propofol: 10.9 ml/hr providing 288 kcal from lipid. Labs reviewed. Medications reviewed and include propofol.  NUTRITION - FOCUSED PHYSICAL EXAM:    Most Recent Value  Orbital Region  No depletion  Upper Arm Region  No depletion  Thoracic and Lumbar Region  Unable to assess  Buccal Region  No depletion  Temple Region  No depletion  Clavicle Bone Region  No depletion  Clavicle and Acromion Bone Region  No depletion  Scapular Bone Region  Unable to assess  Dorsal Hand  No depletion  Patellar Region  No depletion  Anterior Thigh Region  No depletion  Posterior Calf Region  No depletion  Edema (RD Assessment)  None  Hair  Reviewed  Eyes  Unable to assess  Mouth  Unable to assess  Skin  Reviewed  Nails  Reviewed       Diet Order:  Diet NPO time specified  EDUCATION NEEDS:   No education needs have been identified at this time  Skin:  Skin Assessment: Reviewed RN  Assessment  Last BM:  PTA  Height:   Ht Readings from Last 1 Encounters:  03/20/17 5\' 6"  (1.676 m)    Weight:   Wt Readings from Last 1 Encounters:  03/21/17 192 lb 0.3 oz (87.1 kg)    Ideal Body Weight:  59.1 kg  BMI:  Body mass index is 30.99 kg/m.  Estimated Nutritional Needs:   Kcal:  [phone]  Protein:  118 gm  Fluid:  1.5 L   Molli Barrows, RD, LDN, CNSC Pager [phone] After Hours Pager [phone]

## 2017-03-21 NOTE — Progress Notes (Signed)
EEG completed; results pending.    

## 2017-03-21 NOTE — Procedures (Signed)
HPI:  72 y/o with MS change and seizure like activity  TECHNICAL SUMMARY:  A multichannel referential and bipolar montage EEG using the standard international 10-20 system was performed on the patient described as confused.  The dominant background activity consists of a disorganized 5-6 hertz activity seen most prominantly over the posterior head region.    Low voltage fast (beta) activity is distributed symmetrically and maximally over the anterior head regions.  ACTIVATION:  Stepwise photic stimulation and hyperventilation is not performed.  EPILEPTIFORM ACTIVITY:  There were no spikes, sharp waves or paroxysmal activity.  SLEEP:  No sleep is noted   IMPRESSION:  This is an abnormal EEG demonstrating a moderate diffuse slowing of electrocerebral activity.  This can be seen in a wide variety of encephalopathic state including those of a toxic, metabolic, or degenerative nature.  There were no focal, hemispheric, or lateralizing features.  No epileptiform activity was recorded.  This does not exclude the diagnosis of a seizure disorder.  Correlate clinically.

## 2017-03-21 NOTE — Progress Notes (Signed)
Subjective: Patient seen in room currently on intubated and on mechanical ventilation.  Patient is unable to follow commands and unable to speak at this time.  But she is able to react to painful stimulus with voluntary movements of extremities, with normal withdrawal and grimacing.  Family at bedside and was able to provide some history of present illness and review of systems.  Patient does live alone and family checks in on her about twice a day.  Patient has a history of hep C which has been fully treated, liver failure, ovarian cancer as well as arthritis and on chronic narcotics.  Per family they do not know what time patient was down for about the likely believe it is from the night before admission.  Exam: Vitals:   03/21/17 1300 03/21/17 1400  BP: (!) 112/51   Pulse: (!) 117 (!) 115  Resp: (!) 22 (!) 23  Temp: (!) 100.8 F (38.2 C) (!) 100.8 F (38.2 C)  SpO2: 100% 100%    Physical Exam   HEENT-  Normocephalic, no lesions, without obvious abnormality.  Normal external eye and conjunctiva.  Cardiovascular-tachycardic S1-S2 audible, pulses palpable throughout   Lungs-mechanical ventilation sounds, saturations within normal limits Abdomen- All 4 quadrants palpated and nontender Extremities- Warm, dry and intact Musculoskeletal-no joint tenderness, deformity or swelling Skin-warm and dry, no hyperpigmentation, vitiligo, or suspicious lesions  Neuro:  Mental Status: Patient intubated and sedated.  Unable to verbalize any answers or follow commands. Cranial Nerves: II: Discs flat bilaterally; Visual fields grossly normal,  III,IV, VI: ptosis not present, bilaterally pupils equal, round, reactive to light and accommodation V,VII: Unable to grossly assess, face symmetric VIII: hearing normal bilaterally IX,X: uvula rises symmetrically, gag reflex appears present, patient chewing on tube XI: Unable to accurately assess due to sedation XII: Unable to accurately assess Motor: Tone  is normal, bulk is normal.  The patient withdraws appropriately and moves all 4 extremities with stimuli eyes and grimaces to pain. Sensory: Unable to accurately assess but patient does withdraw and grimace to painful stimulus Deep Tendon Reflexes: 2+ and symmetric throughout Plantars: Right: downgoing   Left: downgoing Cerebellar: Unable to accurately assess    Medications:  Current Facility-Administered Medications:  .  0.9 %  sodium chloride infusion, 250 mL, Intravenous, PRN, Minor, Grace Bushy, NP .  0.9 %  sodium chloride infusion, , Intravenous, Continuous, Whiteheart, Kathryn A, NP, Last Rate: 150 mL/hr at 03/21/17 1128 .  chlorhexidine gluconate (MEDLINE KIT) (PERIDEX) 0.12 % solution 15 mL, 15 mL, Mouth Rinse, BID, Mannam, Praveen, MD, 15 mL at 03/21/17 0727 .  Chlorhexidine Gluconate Cloth 2 % PADS 6 each, 6 each, Topical, Daily, Sood, Vineet, MD .  feeding supplement (PRO-STAT SUGAR FREE 64) liquid 60 mL, 60 mL, Per Tube, TID, Sood, Vineet, MD .  feeding supplement (VITAL HIGH PROTEIN) liquid 1,000 mL, 1,000 mL, Per Tube, Q24H, Sood, Vineet, MD .  heparin injection 5,000 Units, 5,000 Units, Subcutaneous, Q8H, Whiteheart, Kathryn A, NP, 5,000 Units at 03/21/17 1338 .  levETIRAcetam (KEPPRA) 500 mg in sodium chloride 0.9 % 100 mL IVPB, 500 mg, Intravenous, Q12H, Amie Portland, MD, Stopped at 03/21/17 1054 .  MEDLINE mouth rinse, 15 mL, Mouth Rinse, QID, Mannam, Praveen, MD, 15 mL at 03/21/17 1128 .  mupirocin ointment (BACTROBAN) 2 %, , Nasal, BID, Mannam, Praveen, MD .  pantoprazole (PROTONIX) injection 40 mg, 40 mg, Intravenous, QHS, Minor, Grace Bushy, NP, 40 mg at 03/20/17 2137 .  propofol (DIPRIVAN) 1000 MG/100ML infusion, 5-80  mcg/kg/min, Intravenous, Titrated, Burgess Estelle, MD, Last Rate: 10.9 mL/hr at 03/21/17 1341, 20 mcg/kg/min at 03/21/17 1341 .  sodium chloride flush (NS) 0.9 % injection 10-40 mL, 10-40 mL, Intracatheter, Q12H, Sood, Vineet, MD .  sodium chloride flush  (NS) 0.9 % injection 10-40 mL, 10-40 mL, Intracatheter, PRN, Chesley Mires, MD  Pertinent Labs/Diagnostics: EEG 03/21/17 IMPRESSION: This is an abnormal EEG demonstrating a moderate diffuse slowing of electrocerebral activity.  This can be seen in a wide variety of encephalopathic state including those of a toxic, metabolic, or degenerative nature.  There were no focal, hemispheric, or lateralizing features.  No epileptiform activity was recorded.  This does not exclude the diagnosis of a seizure disorder.  Correlate clinically  MRA of the head /MRI of the brain 03/21/2017 Impression Normal MRI/MRA of the brain  CT Head Nocon/CT Cervical spine 03/20/17 IMPRESSION: No acute intracranial abnormality Negative for cervical spine fracture. Cervical scoliosis and spondylosis   Assessment: Heather Horton is a 72 y.o. female with PMH of HTN, HLD, hep C, cervical cancer, bronchitis who wa found down earlier today by her daughter unresponsive.  Found to be posturing with possible seizure like activity. She was also noted to be grunting and aspirin for air  She was intubated in the emergency room sternum Keppra for possible seizures. She was also started on sedation. Patient admitted to [facility] and neurology was consulted after patient was in the ICU  1.Acute Encephalopathy- patient found down unresponsive: Altered mental status etiology unclear, likely secondary to metabolic, anoxic or toxic etiologies.  MRI of the brain and CT of the head unremarkable for any acute intracranial processes.  Initially, patient was thought to have had a seizure by EMS.  Patient was started on low-dose Keppra and was continued on Keppra 500 mg twice daily.  Spot EEG done today revealed abnormal EEG with with findings likely visible and toxic, metabolic or degenerative encephalopathic state.  But did not record any epileptiform activity.   Patient has a history of narcotic use due to arthritis, consider possible overdose, excluded  hepatic encephalopathy inpatient with a history of liver failure ammonia level-normal.  Continue to pursue further workup for metabolic etiologies  2. Acute Respiratory Failure - Vent dependent  secondary to altered mental status 3. Rhabdomyolysis?  patient was down for an unknown period of time, with elevated CK levels which minimally increased in a period of 24 hours. 4.  Acute kidney insufficiency 5.  Arthritis-patient on chronic narcotics  Recommendations: 1.  EEG did not record any epileptiform activities. 2.  Continue Keppra at this time 3.  Continue frequent neurochecks 4.  Continue aggressive management of medical comorbidities.   Jacob Moores DNP Neuro-hospitalist Team [phone] 03/21/2017, 3:33 PM  Attending Neurohospitalist Addendum Patient seen and examined with APP/Resident. Agree with the history and physical as documented above. Agree with the plan as documented, which I helped formulate. I have independently reviewed the chart, obtained history, review of systems and examined the patient.I have personally reviewed pertinent head/neck/spine imaging (CT/MRI). Please feel free to call with any questions. --- Amie Portland, MD [facility] Pager: [phone]  If 7pm to 7am, please call on call as listed on AMION.

## 2017-03-21 NOTE — Progress Notes (Signed)
PULMONARY / CRITICAL CARE MEDICINE   Name: Heather Horton MRN: [id number] DOB: April 30, 1945    ADMISSION DATE:  03/20/2017 None available  REFERRING MD: Emergency department physician  CHIEF COMPLAINT: Altered mental status respiratory failure  HISTORY OF PRESENT ILLNESS:   72 year old female with known history of HTN, Hep C, cervical cancer, chronic pain from degenerative joint disease and arthritis bursitis.  She was last seen normal on 03/18/2017 by her daughter.  Subsequently on 03/20/2017 she was found by the daughter on the floor, unable to follow commands and gasping and grunting for air. She was intubated in ER. Thought ?seizure activity v stroke v anoxic injury.  MRI without acute infarct.    SUBJECTIVE:  No acute change overnight.  Starting EEG.  Daughters at bedside.    VITAL SIGNS: BP 118/65   Pulse (!) 117   Temp (!) 100.6 F (38.1 C)   Resp (!) 25   Ht 5\' 6"  (1.676 m)   Wt 87.1 kg (192 lb 0.3 oz)   SpO2 100%   BMI 30.99 kg/m   HEMODYNAMICS:    VENTILATOR SETTINGS: Vent Mode: PSV;CPAP FiO2 (%):  [40 %-80 %] 40 % Set Rate:  [14 bmp] 14 bmp Vt Set:  [500 mL] 500 mL PEEP:  [5 cmH20] 5 cmH20 Pressure Support:  [5 cmH20] 5 cmH20 Plateau Pressure:  [13 cmH20-19 cmH20] 19 cmH20  INTAKE / OUTPUT: I/O last 3 completed shifts: In: 1604 [I.V.:1604] Out: 380 [Urine:305; Emesis/NG output:75]  PHYSICAL EXAMINATION: General: chronically ill appearing female, NAD, sedated on vent Neuro:  Sedated on propofol, EEG starting, opens eyes to name, does not follow commands, MAE spontaneously  HEENT: mm moist, ETT Cardiovascular: Heart sounds are regular regular rate and rhythm Lungs: resps even non labored on PS 5/5, clear  Abdomen: Positive bowel sounds nondistended Musculoskeletal: Warm no obvious deformities, no edema    LABS:  BMET Recent Labs  Lab 03/20/17 1532 03/20/17 1559 03/20/17 1815 03/21/17 0500  NA 137 137 135 136  K 4.6 5.0 3.9 4.2  CL 104 106 103 105   CO2 19*  --  19* 18*  BUN 18 28* 19 25*  CREATININE 1.04* 0.80 0.88 1.22*  GLUCOSE 106* 102* 104* 90    Electrolytes Recent Labs  Lab 03/20/17 1532 03/20/17 1815 03/21/17 0500  CALCIUM 9.1 8.6* 8.8*  MG 2.2 2.1 2.1  PHOS  --  3.1 3.4    CBC Recent Labs  Lab 03/20/17 1532 03/20/17 1559 03/20/17 1815 03/21/17 0500  WBC 10.9*  --  11.2* 12.3*  HGB 14.6 16.0* 13.7 12.8  HCT 41.3 47.0* 39.2 36.6  PLT 282  --  279 265    Coag's Recent Labs  Lab 03/20/17 1532 03/20/17 1815  APTT  --  34  INR 1.08 1.12    Sepsis Markers Recent Labs  Lab 03/20/17 1559 03/20/17 1815  LATICACIDVEN 4.29*  --   PROCALCITON  --  3.47    ABG Recent Labs  Lab 03/20/17 1551 03/21/17 0410  PHART 7.356 7.429  PCO2ART 35.5 32.2  PO2ART 169.0* 134*    Liver Enzymes Recent Labs  Lab 03/20/17 1532 03/20/17 1815  AST 43* 41  ALT 18 18  ALKPHOS 61 56  BILITOT 0.9 0.6  ALBUMIN 3.5 3.2*    Cardiac Enzymes Recent Labs  Lab 03/20/17 1815 03/20/17 2214 03/21/17 0500  TROPONINI <0.03 <0.03 <0.03    Glucose Recent Labs  Lab 03/20/17 1415 03/20/17 1817  GLUCAP 98 97    Imaging  Ct Head Wo Contrast  Result Date: 03/20/2017 CLINICAL DATA:  Found on floor.  Unresponsive. EXAM: CT HEAD WITHOUT CONTRAST CT CERVICAL SPINE WITHOUT CONTRAST TECHNIQUE: Multidetector CT imaging of the head and cervical spine was performed following the standard protocol without intravenous contrast. Multiplanar CT image reconstructions of the cervical spine were also generated. COMPARISON:  None. FINDINGS: CT HEAD FINDINGS Brain: No evidence of acute infarction, hemorrhage, hydrocephalus, extra-axial collection or mass lesion/mass effect. Vascular: Negative for hyperdense vessel Skull: Negative Sinuses/Orbits: Negative Other: The patient is intubated. CT CERVICAL SPINE FINDINGS Alignment: Scoliosis.  Normal alignment. Skull base and vertebrae: Negative for fracture Soft tissues and spinal canal:  Negative for mass or adenopathy. Disc levels: Disc degeneration and disc bulging at C4-5. Disc degeneration and spurring at C5-6 and C6-7 causing spinal and foraminal stenosis right greater than left. Upper chest: Emphysema Other: Endotracheal tube in good position.  NG tube in the esophagus IMPRESSION: No acute intracranial abnormality Negative for cervical spine fracture. Cervical scoliosis and spondylosis. Electronically Signed   By: Franchot Gallo M.D.   On: 03/20/2017 18:22   Ct Cervical Spine Wo Contrast  Result Date: 03/20/2017 CLINICAL DATA:  Found on floor.  Unresponsive. EXAM: CT HEAD WITHOUT CONTRAST CT CERVICAL SPINE WITHOUT CONTRAST TECHNIQUE: Multidetector CT imaging of the head and cervical spine was performed following the standard protocol without intravenous contrast. Multiplanar CT image reconstructions of the cervical spine were also generated. COMPARISON:  None. FINDINGS: CT HEAD FINDINGS Brain: No evidence of acute infarction, hemorrhage, hydrocephalus, extra-axial collection or mass lesion/mass effect. Vascular: Negative for hyperdense vessel Skull: Negative Sinuses/Orbits: Negative Other: The patient is intubated. CT CERVICAL SPINE FINDINGS Alignment: Scoliosis.  Normal alignment. Skull base and vertebrae: Negative for fracture Soft tissues and spinal canal: Negative for mass or adenopathy. Disc levels: Disc degeneration and disc bulging at C4-5. Disc degeneration and spurring at C5-6 and C6-7 causing spinal and foraminal stenosis right greater than left. Upper chest: Emphysema Other: Endotracheal tube in good position.  NG tube in the esophagus IMPRESSION: No acute intracranial abnormality Negative for cervical spine fracture. Cervical scoliosis and spondylosis. Electronically Signed   By: Franchot Gallo M.D.   On: 03/20/2017 18:22   Mr Jodene Nam Head Wo Contrast  Result Date: 03/21/2017 CLINICAL DATA:  Possible seizure. EXAM: MRI HEAD WITHOUT AND WITH CONTRAST MRA HEAD WITHOUT CONTRAST  TECHNIQUE: Multiplanar, multiecho pulse sequences of the brain and surrounding structures were obtained without and with intravenous contrast. Angiographic images of the head were obtained using MRA technique without contrast. CONTRAST:  19mL MULTIHANCE GADOBENATE DIMEGLUMINE 529 MG/ML IV SOLN COMPARISON:  Head CT 03/20/2017 FINDINGS: MRI HEAD FINDINGS Brain: The midline structures are normal. No focal diffusion restriction to indicate acute infarct. No intraparenchymal hemorrhage. The brain parenchymal signal is normal for age. No mass lesion. No chronic microhemorrhage or cerebral amyloid angiopathy. No hydrocephalus, age advanced atrophy or lobar predominant volume loss. No dural abnormality or extra-axial collection. Skull and upper cervical spine: The visualized skull base, calvarium, upper cervical spine and extracranial soft tissues are normal. Sinuses/Orbits: No fluid levels or advanced mucosal thickening. No mastoid effusion. Normal orbits. MRA HEAD FINDINGS Intracranial internal carotid arteries: Normal. Anterior cerebral arteries: Normal. Middle cerebral arteries: Normal. Posterior communicating arteries: Present on the right. Posterior cerebral arteries: Normal. Basilar artery: Normal. Vertebral arteries: Left dominant. Normal. Superior cerebellar arteries: Normal. Anterior inferior cerebellar arteries: Normal. Posterior inferior cerebellar arteries: Normal. IMPRESSION: Normal MRI/MRA of brain. Electronically Signed   By: Cletus Gash.D.  On: 03/21/2017 04:12   Mr Jeri Cos KZ Contrast  Result Date: 03/21/2017 CLINICAL DATA:  Possible seizure. EXAM: MRI HEAD WITHOUT AND WITH CONTRAST MRA HEAD WITHOUT CONTRAST TECHNIQUE: Multiplanar, multiecho pulse sequences of the brain and surrounding structures were obtained without and with intravenous contrast. Angiographic images of the head were obtained using MRA technique without contrast. CONTRAST:  65mL MULTIHANCE GADOBENATE DIMEGLUMINE 529 MG/ML IV SOLN  COMPARISON:  Head CT 03/20/2017 FINDINGS: MRI HEAD FINDINGS Brain: The midline structures are normal. No focal diffusion restriction to indicate acute infarct. No intraparenchymal hemorrhage. The brain parenchymal signal is normal for age. No mass lesion. No chronic microhemorrhage or cerebral amyloid angiopathy. No hydrocephalus, age advanced atrophy or lobar predominant volume loss. No dural abnormality or extra-axial collection. Skull and upper cervical spine: The visualized skull base, calvarium, upper cervical spine and extracranial soft tissues are normal. Sinuses/Orbits: No fluid levels or advanced mucosal thickening. No mastoid effusion. Normal orbits. MRA HEAD FINDINGS Intracranial internal carotid arteries: Normal. Anterior cerebral arteries: Normal. Middle cerebral arteries: Normal. Posterior communicating arteries: Present on the right. Posterior cerebral arteries: Normal. Basilar artery: Normal. Vertebral arteries: Left dominant. Normal. Superior cerebellar arteries: Normal. Anterior inferior cerebellar arteries: Normal. Posterior inferior cerebellar arteries: Normal. IMPRESSION: Normal MRI/MRA of brain. Electronically Signed   By: Ulyses Jarred M.D.   On: 03/21/2017 04:12   Dg Chest Port 1 View  Result Date: 03/21/2017 CLINICAL DATA:  Respiratory failure EXAM: PORTABLE CHEST 1 VIEW COMPARISON:  Portable chest x-ray of March 20, 2017 FINDINGS: The lungs are adequately inflated. There is no focal infiltrate. There is no pleural effusion or pneumothorax. The heart and pulmonary vascularity are normal. The mediastinum is normal in width. There calcification in the wall of the thoracic aorta. The endotracheal tube tip projects approximately 2.8 cm above the carina. The esophagogastric tube tip and proximal port project below the GE junction. IMPRESSION: There is no acute cardiopulmonary abnormality. The support tubes are in reasonable position. Thoracic aortic atherosclerosis. Electronically Signed    By: David  Martinique M.D.   On: 03/21/2017 07:02   Dg Chest Port 1 View  Result Date: 03/20/2017 CLINICAL DATA:  72 year old female status post intubation. EXAM: PORTABLE CHEST 1 VIEW COMPARISON:  Chest radiographs 05/11/2012. FINDINGS: Portable AP supine view at 1435 hours. Endotracheal tube tip in good position just above the level the clavicles. Enteric tube courses to the abdomen, side hole up the level of the gastric fundus. Mildly low lung volumes. Allowing for portable technique the lungs are clear. Normal cardiac size and mediastinal contours. Negative visible bowel gas pattern. No acute osseous abnormality identified. IMPRESSION: 1. ET tube and enteric tube appropriately placed. 2.  No acute cardiopulmonary abnormality. Electronically Signed   By: Genevie Ann M.D.   On: 03/20/2017 14:53     STUDIES:  03/20/2017 CT head>>neg acute  1/20 MRI >>> neg acute  1/21 EEG>>>  CULTURES: 03/20/2017 blood cultures x2>> 03/20/2017 sputum culture>> 03/20/2017 urine culture>> 03/20/2017 pro-calcitonin>>  ANTIBIOTICS: None  SIGNIFICANT EVENTS: 03/20/2017 intubated  LINES/TUBES: Right femoral CVL 1/20 >> ETT 1/20>>>  DISCUSSION: 72 year old female with known history of chronic pain from degenerative joint disease and arthritis bursitis.  She was last seen normal on 03/18/2017 by her daughter.  Subsequently on 03/20/2017 she was found by the daughter on the floor breathing color is good but unable to follow commands and gasping and grunting for air. Emergency medical service was activated she was transferred to the [facility] emergency department. Of note  left IJ and right IJ central lines were attempted without success.  She had a right and femoral central line placed per the emergency department physician. At the time of this dictation she has not had a CT scan of the head nor has neurology been consulted.  Note her capillary blood glucose was 98 on admission.  Further labs are pending at this  time she will be admitted to the intensive care unit and will need evaluation by neurology and evaluation of her CT scan of the head.  ASSESSMENT / PLAN:  PULMONARY A: Vent dependent respiratory failure secondary to altered mental status Intubated by the EDP P:   Vent support - 8cc/kg  F/u CXR  F/u ABG PS wean as tol  SBT in am - can likely extubate once more awake    CARDIOVASCULAR A:  History of hypertension Elevated lactate  Elevated ck - ?rhabdo - unknown downtime  P:  Continue to hold antihypertensive for now repeat lactate    RENAL A:   Renal insufficiency P:   Avoid nephrotoxic drugs Replete electrolytes as needed Gentle volume  F/u CK  Gentle volume - increase MIVF to 150/hr  GASTROINTESTINAL A:   History of colon polyps GI protection P:   PPI  NPO  Add TF if not extubated 1/22  HEMATOLOGIC A:   Known history of hepatitis C History of cervical cancer P:  F/u CBC  Sq heparin   INFECTIOUS A:   No known infectious process Elevated lactate  P:   Pan culture Pro-calcitonin No antibiotics at this time F/u lactate ensure clearance   ENDOCRINE No acute issues currently P:   Follow glucose on labs  NEUROLOGIC A:   AMS - unclear etiology. MRI brain, CT head neg.  ?unintentional OD chronic opiates.  P:   RASS goal: -1 Low dose propofol  Neuro following  EEG  keppra per neuro  WUA in am  Check ammonia    FAMILY  - Updates: 2 daughters updated at the bedside 1/21.  - Inter-disciplinary family meet or Palliative Care meeting due by:  day 7   Nickolas Madrid, NP 03/21/2017  10:53 AM Pager: [phone] or [phone]

## 2017-03-21 NOTE — Progress Notes (Signed)
   03/21/17 1200  Clinical Encounter Type  Visited With Patient and family together  Visit Type Initial  Referral From Nurse  Consult/Referral To Chaplain  Spiritual Encounters  Spiritual Needs Emotional  Stress Factors  Patient Stress Factors Exhausted  Family Stress Factors Exhausted    Patient was in her bed intubated but seemingly restless with her hands carved. Two medical staff on-site helping her. 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 The two daughters asked for prayer and we all did. I provided reflective listening and compassionate presence.  Lunna Vogelgesang a Medical sales representative, [employer]

## 2017-03-21 NOTE — Progress Notes (Signed)
RT NOTE:  Pt transported to MRI and back without event.

## 2017-03-22 LAB — URINE CULTURE: Special Requests: NORMAL

## 2017-03-22 LAB — GLUCOSE, CAPILLARY
GLUCOSE-CAPILLARY: 92 mg/dL (ref 65–99)
GLUCOSE-CAPILLARY: 88 mg/dL (ref 65–99)
Glucose-Capillary: 98 mg/dL (ref 65–99)
Glucose-Capillary: 122 mg/dL — ABNORMAL HIGH (ref 65–99)
Glucose-Capillary: 93 mg/dL (ref 65–99)
Glucose-Capillary: 99 mg/dL (ref 65–99)

## 2017-03-22 LAB — CBC
HCT: 33.8 % — ABNORMAL LOW (ref 36.0–46.0)
Hemoglobin: 11.1 g/dL — ABNORMAL LOW (ref 12.0–15.0)
MCH: 34.8 pg — ABNORMAL HIGH (ref 26.0–34.0)
MCHC: 32.8 g/dL (ref 30.0–36.0)
MCV: 106 fL — ABNORMAL HIGH (ref 78.0–100.0)
PLATELETS: 209 10*3/uL (ref 150–400)
RBC: 3.19 MIL/uL — ABNORMAL LOW (ref 3.87–5.11)
RDW: 13.8 % (ref 11.5–15.5)
WBC: 10.2 10*3/uL (ref 4.0–10.5)

## 2017-03-22 LAB — CK: Total CK: 908 U/L — ABNORMAL HIGH (ref 38–234)

## 2017-03-22 LAB — BASIC METABOLIC PANEL
ANION GAP: 11 (ref 5–15)
BUN: 24 mg/dL — AB (ref 6–20)
CALCIUM: 8.4 mg/dL — AB (ref 8.9–10.3)
CO2: 17 mmol/L — AB (ref 22–32)
CREATININE: 0.77 mg/dL (ref 0.44–1.00)
Chloride: 112 mmol/L — ABNORMAL HIGH (ref 101–111)
GFR calc Af Amer: >60 mL/min (ref >60)
GLUCOSE: 96 mg/dL (ref 65–99)
Potassium: 4 mmol/L (ref 3.5–5.1)
Sodium: 140 mmol/L (ref 135–145)

## 2017-03-22 MED ORDER — TRAZODONE HCL 100 MG PO TABS
100.0000 mg | ORAL_TABLET | Freq: Every evening | ORAL | Status: DC | PRN
  Administered 2017-03-22: 100 mg via ORAL
  Filled 2017-03-22 (×3): qty 1

## 2017-03-22 MED ORDER — CHLORHEXIDINE GLUCONATE CLOTH 2 % EX PADS
6.0000 | MEDICATED_PAD | Freq: Every day | CUTANEOUS | Status: DC
  Administered 2017-03-22 – 2017-03-25 (×4): 6 via TOPICAL

## 2017-03-22 MED ORDER — DOCUSATE SODIUM 100 MG PO CAPS
100.0000 mg | ORAL_CAPSULE | Freq: Every day | ORAL | Status: DC
  Administered 2017-03-22: 100 mg via ORAL
  Filled 2017-03-22 (×2): qty 1

## 2017-03-22 MED ORDER — SENNA 8.6 MG PO TABS
1.0000 | ORAL_TABLET | Freq: Every day | ORAL | Status: DC
  Administered 2017-03-22: 8.6 mg via ORAL
  Filled 2017-03-22 (×2): qty 1

## 2017-03-22 MED ORDER — KETOROLAC TROMETHAMINE 15 MG/ML IJ SOLN
15.0000 mg | Freq: Once | INTRAMUSCULAR | Status: AC
  Administered 2017-03-22: 15 mg via INTRAVENOUS
  Filled 2017-03-22: qty 1

## 2017-03-22 MED ORDER — ACETAMINOPHEN 325 MG PO TABS
650.0000 mg | ORAL_TABLET | Freq: Four times a day (QID) | ORAL | Status: DC | PRN
  Administered 2017-03-22 – 2017-03-25 (×5): 650 mg via ORAL
  Filled 2017-03-22 (×5): qty 2

## 2017-03-22 NOTE — Progress Notes (Signed)
Subjective: Intubated breathing over the vent but able to follow commands and much more awake today. Making motions she wants to be extubated.  No seizure activity noted. Still drowsy.   Exam: Vitals:   03/22/17 0845 03/22/17 0900  BP:  136/62  Pulse: 91 99  Resp: (!) 30 (!) 23  Temp: 99 F (37.2 C) 98.8 F (37.1 C)  SpO2: 100% 100%    Physical Exam   HEENT-  Normocephalic, no lesions, without obvious abnormality.  Normal external eye and conjunctiva.   Cardiovascular- S1-S2 audible, pulses palpable throughout   Lungs-no rhonchi or wheezing noted, no excessive working breathing.  Saturations within normal limits Abdomen- All 4 quadrants palpated and nontender Extremities- Warm, dry and intact Musculoskeletal-no joint tenderness, deformity or swelling Skin-warm and dry, no hyperpigmentation, vitiligo, or suspicious lesions Neuro:  Mental Status: Alert.  Intubated but following commands.  Cranial Nerves: II: Visual fields grossly normal,  III,IV, VI: ptosis not present, extra-ocular motions intact bilaterally pupils equal, round, reactive to light and accommodation V,VII:face symmetric, facial light touch sensation normal bilaterally VIII: hearing normal bilaterally  Motor: Moving all extremities antigravity Sensory: Pinprick and light touch intact throughout, bilaterally Deep Tendon Reflexes: 2+ and symmetric throughout Plantars: Right: downgoing   Left: downgoing Cerebellar: normal finger-to-nose,     Medications:  Scheduled: . chlorhexidine gluconate (MEDLINE KIT)  15 mL Mouth Rinse BID  . Chlorhexidine Gluconate Cloth  6 each Topical Q0600  . heparin injection (subcutaneous)  5,000 Units Subcutaneous Q8H  . mouth rinse  15 mL Mouth Rinse QID  . mupirocin ointment   Nasal BID  . pantoprazole (PROTONIX) IV  40 mg Intravenous QHS   Continuous: . sodium chloride 150 mL/hr at 03/22/17 0819  . levETIRAcetam Stopped (03/22/17 0009)   PRN:  Pertinent  Labs/Diagnostics:   Ct Head Wo Contrast  Result Date: 03/20/2017 CLINICAL DATA:  Found on floor.  Unresponsive. EXAM: CT HEAD WITHOUT CONTRAST CT CERVICAL SPINE WITHOUT CONTRAST TECHNIQUE: Multidetector CT imaging of the head and cervical spine was performed following the standard protocol without intravenous contrast. Multiplanar CT image reconstructions of the cervical spine were also generated. COMPARISON:  None. FINDINGS: CT HEAD FINDINGS Brain: No evidence of acute infarction, hemorrhage, hydrocephalus, extra-axial collection or mass lesion/mass effect. Vascular: Negative for hyperdense vessel Skull: Negative Sinuses/Orbits: Negative Other: The patient is intubated. CT CERVICAL SPINE FINDINGS Alignment: Scoliosis.  Normal alignment. Skull base and vertebrae: Negative for fracture Soft tissues and spinal canal: Negative for mass or adenopathy. Disc levels: Disc degeneration and disc bulging at C4-5. Disc degeneration and spurring at C5-6 and C6-7 causing spinal and foraminal stenosis right greater than left. Upper chest: Emphysema Other: Endotracheal tube in good position.  NG tube in the esophagus IMPRESSION: No acute intracranial abnormality Negative for cervical spine fracture. Cervical scoliosis and spondylosis. Electronically Signed   By: Franchot Gallo M.D.   On: 03/20/2017 18:22   Ct Cervical Spine Wo Contrast  Result Date: 03/20/2017 CLINICAL DATA:  Found on floor.  Unresponsive. EXAM: CT HEAD WITHOUT CONTRAST CT CERVICAL SPINE WITHOUT CONTRAST TECHNIQUE: Multidetector CT imaging of the head and cervical spine was performed following the standard protocol without intravenous contrast. Multiplanar CT image reconstructions of the cervical spine were also generated. COMPARISON:  None. FINDINGS: CT HEAD FINDINGS Brain: No evidence of acute infarction, hemorrhage, hydrocephalus, extra-axial collection or mass lesion/mass effect. Vascular: Negative for hyperdense vessel Skull: Negative Sinuses/Orbits:  Negative Other: The patient is intubated. CT CERVICAL SPINE FINDINGS Alignment: Scoliosis.  Normal  alignment. Skull base and vertebrae: Negative for fracture Soft tissues and spinal canal: Negative for mass or adenopathy. Disc levels: Disc degeneration and disc bulging at C4-5. Disc degeneration and spurring at C5-6 and C6-7 causing spinal and foraminal stenosis right greater than left. Upper chest: Emphysema Other: Endotracheal tube in good position.  NG tube in the esophagus IMPRESSION: No acute intracranial abnormality Negative for cervical spine fracture. Cervical scoliosis and spondylosis. Electronically Signed   By: Franchot Gallo M.D.   On: 03/20/2017 18:22   Mr Jodene Nam Head Wo Contrast  Result Date: 03/21/2017 CLINICAL DATA:  Possible seizure. EXAM: MRI HEAD WITHOUT AND WITH CONTRAST MRA HEAD WITHOUT CONTRAST TECHNIQUE: Multiplanar, multiecho pulse sequences of the brain and surrounding structures were obtained without and with intravenous contrast. Angiographic images of the head were obtained using MRA technique without contrast. CONTRAST:  45m MULTIHANCE GADOBENATE DIMEGLUMINE 529 MG/ML IV SOLN COMPARISON:  Head CT 03/20/2017 FINDINGS: MRI HEAD FINDINGS Brain: The midline structures are normal. No focal diffusion restriction to indicate acute infarct. No intraparenchymal hemorrhage. The brain parenchymal signal is normal for age. No mass lesion. No chronic microhemorrhage or cerebral amyloid angiopathy. No hydrocephalus, age advanced atrophy or lobar predominant volume loss. No dural abnormality or extra-axial collection. Skull and upper cervical spine: The visualized skull base, calvarium, upper cervical spine and extracranial soft tissues are normal. Sinuses/Orbits: No fluid levels or advanced mucosal thickening. No mastoid effusion. Normal orbits. MRA HEAD FINDINGS Intracranial internal carotid arteries: Normal. Anterior cerebral arteries: Normal. Middle cerebral arteries: Normal. Posterior  communicating arteries: Present on the right. Posterior cerebral arteries: Normal. Basilar artery: Normal. Vertebral arteries: Left dominant. Normal. Superior cerebellar arteries: Normal. Anterior inferior cerebellar arteries: Normal. Posterior inferior cerebellar arteries: Normal. IMPRESSION: Normal MRI/MRA of brain. Electronically Signed   By: KUlyses JarredM.D.   On: 03/21/2017 04:12   Mr BJeri CosWJTContrast  Result Date: 03/21/2017 CLINICAL DATA:  Possible seizure. EXAM: MRI HEAD WITHOUT AND WITH CONTRAST MRA HEAD WITHOUT CONTRAST TECHNIQUE: Multiplanar, multiecho pulse sequences of the brain and surrounding structures were obtained without and with intravenous contrast. Angiographic images of the head were obtained using MRA technique without contrast. CONTRAST:  112mMULTIHANCE GADOBENATE DIMEGLUMINE 529 MG/ML IV SOLN COMPARISON:  Head CT 03/20/2017 FINDINGS: MRI HEAD FINDINGS Brain: The midline structures are normal. No focal diffusion restriction to indicate acute infarct. No intraparenchymal hemorrhage. The brain parenchymal signal is normal for age. No mass lesion. No chronic microhemorrhage or cerebral amyloid angiopathy. No hydrocephalus, age advanced atrophy or lobar predominant volume loss. No dural abnormality or extra-axial collection. Skull and upper cervical spine: The visualized skull base, calvarium, upper cervical spine and extracranial soft tissues are normal. Sinuses/Orbits: No fluid levels or advanced mucosal thickening. No mastoid effusion. Normal orbits. MRA HEAD FINDINGS Intracranial internal carotid arteries: Normal. Anterior cerebral arteries: Normal. Middle cerebral arteries: Normal. Posterior communicating arteries: Present on the right. Posterior cerebral arteries: Normal. Basilar artery: Normal. Vertebral arteries: Left dominant. Normal. Superior cerebellar arteries: Normal. Anterior inferior cerebellar arteries: Normal. Posterior inferior cerebellar arteries: Normal. IMPRESSION:  Normal MRI/MRA of brain. Electronically Signed   By: KeUlyses Jarred.D.   On: 03/21/2017 04:12   Dg Chest Port 1 View  Result Date: 03/21/2017 CLINICAL DATA:  Respiratory failure EXAM: PORTABLE CHEST 1 VIEW COMPARISON:  Portable chest x-ray of March 20, 2017 FINDINGS: The lungs are adequately inflated. There is no focal infiltrate. There is no pleural effusion or pneumothorax. The heart and pulmonary vascularity are normal. The mediastinum  is normal in width. There calcification in the wall of the thoracic aorta. The endotracheal tube tip projects approximately 2.8 cm above the carina. The esophagogastric tube tip and proximal port project below the GE junction. IMPRESSION: There is no acute cardiopulmonary abnormality. The support tubes are in reasonable position. Thoracic aortic atherosclerosis. Electronically Signed   By: David  Martinique M.D.   On: 03/21/2017 07:02   Dg Chest Port 1 View  Result Date: 03/20/2017 CLINICAL DATA:  72 year old female status post intubation. EXAM: PORTABLE CHEST 1 VIEW COMPARISON:  Chest radiographs 05/11/2012. FINDINGS: Portable AP supine view at 1435 hours. Endotracheal tube tip in good position just above the level the clavicles. Enteric tube courses to the abdomen, side hole up the level of the gastric fundus. Mildly low lung volumes. Allowing for portable technique the lungs are clear. Normal cardiac size and mediastinal contours. Negative visible bowel gas pattern. No acute osseous abnormality identified. IMPRESSION: 1. ET tube and enteric tube appropriately placed. 2.  No acute cardiopulmonary abnormality. Electronically Signed   By: Genevie Ann M.D.   On: 03/20/2017 14:53     Etta Quill PA-C [facility] [phone]  Impression: 72 year old female found posturing and unresponsive with possible seizure. Acute encephalopathy but now resolving. Unclear if was related to seizure as EEG only showed slowing. Currently remains on Keppra.  Most likely  resolving toxic metabolic encephalopathy in the setting of opiate overuse. I have personally reviewed the MRI and MRA of the brain that shows no acute changes, no evidence of stroke or significant stenosis.  Recommendations: -Continue Keppra at this time -Continue frequent neurochecks -Maintain seizure precautions. -Continue aggressive management of medical comorbidities -Watch for opiate withdrawal -Minimize sedating medications.  Neurology will be available as needed.  Please call with questions.  03/22/2017, 10:05 AM   Attending Neurohospitalist Addendum Patient seen and examined with APP/Resident. Agree with the history and physical as documented above. Agree with the plan as documented, which I helped formulate. I have independently reviewed the chart, obtained history, review of systems and examined the patient.I have personally reviewed pertinent head/neck/spine imaging (CT/MRI). Please feel free to call with any questions. --- Amie Portland, MD [facility] Pager: [phone]  If 7pm to 7am, please call on call as listed on AMION.

## 2017-03-22 NOTE — Progress Notes (Signed)
   03/22/17 1100  Clinical Encounter Type  Visited With Patient and family together  Visit Type Follow-up  Referral From Chaplain  Consult/Referral To Chaplain  Spiritual Encounters  Spiritual Needs Prayer  Stress Factors  Patient Stress Factors Exhausted  Family Stress Factors Exhausted    Patient was extubated and seemingly awake but still a little bit disoriented. Large family members on-site. They expressed their joy about the patient's condition as compared to how she was early on. I provided reflective listening and prayer.  Lacey Wallman a Medical sales representative, [employer]

## 2017-03-22 NOTE — Procedures (Signed)
Extubation Procedure Note  Patient Details:   Name: Heather Horton DOB: 1945-07-13 MRN: [id number]   Airway Documentation:     Evaluation  O2 sats: stable throughout Complications: No apparent complications Patient did tolerate procedure well. Bilateral Breath Sounds: Clear, Diminished   Yes   Patient extubated to 2L nasal cannula per MD order.  Positive cuff leak noted.  No evidence of stridor.  Patient able to speak post extubation.  Sats currently 100%.  Vitals are stable.  No complications noted.    Philomena Doheny 03/22/2017, 10:25 AM

## 2017-03-22 NOTE — Progress Notes (Addendum)
PULMONARY / CRITICAL CARE MEDICINE   Name: Heather Horton MRN: [id number] DOB: 1946/02/20    ADMISSION DATE:  03/20/2017 None available  REFERRING MD: Dr. Lita Mains, ER  CHIEF COMPLAINT: Altered mental status  HISTORY OF PRESENT ILLNESS:   72 yo female with hx of chronic pain brought to ER with altered mental status and questionable seizure activity.  Required intubation for airway protection.  SUBJECTIVE:  Tolerating pressure support.  VITAL SIGNS: BP 136/62   Pulse 99   Temp 98.8 F (37.1 C)   Resp (!) 23   Ht 5\' 6"  (1.676 m)   Wt 186 lb 4.6 oz (84.5 kg)   SpO2 100%   BMI 30.07 kg/m   VENTILATOR SETTINGS: Vent Mode: CPAP;PSV FiO2 (%):  [40 %] 40 % Set Rate:  [14 bmp] 14 bmp Vt Set:  [500 mL] 500 mL PEEP:  [5 cmH20] 5 cmH20 Pressure Support:  [5 cmH20] 5 cmH20 Plateau Pressure:  [16 cmH20-18 cmH20] 18 cmH20  INTAKE / OUTPUT: I/O last 3 completed shifts: In: 5214.3 [I.V.:4708.5; NG/GT:295.8; IV Piggyback:210] Out: 1240 [Urine:1080; Emesis/NG output:160]  PHYSICAL EXAMINATION:  General - more alert Eyes - pupils reactive ENT - ETT in place Cardiac - regular, no murmur Chest - no wheeze, rales Abd - soft, non tender Ext - no edema Skin - no rashes Neuro - follows simple commands, moves extremities   LABS:  BMET Recent Labs  Lab 03/20/17 1815 03/21/17 0500 03/22/17 0722  NA 135 136 140  K 3.9 4.2 4.0  CL 103 105 112*  CO2 19* 18* 17*  BUN 19 25* 24*  CREATININE 0.88 1.22* 0.77  GLUCOSE 104* 90 96    Electrolytes Recent Labs  Lab 03/20/17 1532 03/20/17 1815 03/21/17 0500 03/22/17 0722  CALCIUM 9.1 8.6* 8.8* 8.4*  MG 2.2 2.1 2.1  --   PHOS  --  3.1 3.4  --     CBC Recent Labs  Lab 03/20/17 1815 03/21/17 0500 03/22/17 0722  WBC 11.2* 12.3* 10.2  HGB 13.7 12.8 11.1*  HCT 39.2 36.6 33.8*  PLT 279 265 209    Coag's Recent Labs  Lab 03/20/17 1532 03/20/17 1815  APTT  --  34  INR 1.08 1.12    Sepsis Markers Recent Labs   Lab 03/20/17 1559 03/20/17 1815 03/21/17 1120  LATICACIDVEN 4.29*  --  0.9  PROCALCITON  --  3.47  --     ABG Recent Labs  Lab 03/20/17 1551 03/21/17 0410  PHART 7.356 7.429  PCO2ART 35.5 32.2  PO2ART 169.0* 134*    Liver Enzymes Recent Labs  Lab 03/20/17 1532 03/20/17 1815  AST 43* 41  ALT 18 18  ALKPHOS 61 56  BILITOT 0.9 0.6  ALBUMIN 3.5 3.2*    Cardiac Enzymes Recent Labs  Lab 03/20/17 1815 03/20/17 2214 03/21/17 0500  TROPONINI <0.03 <0.03 <0.03    Glucose Recent Labs  Lab 03/20/17 1817 03/21/17 1627 03/21/17 1951 03/21/17 2326 03/22/17 0327 03/22/17 0847  GLUCAP 97 80 86 106* 98 122*    Imaging No results found.   STUDIES:  1/20 CT head>>> no acute findings 1/20 MRI >>> normal 1/21 EEG>>> diffuse slowing  CULTURES: 03/20/2017 blood cultures x2>> 03/20/2017 sputum culture>> 03/20/2017 urine culture>> multiple species  ANTIBIOTICS: None  SIGNIFICANT EVENTS: 1/20 admit, neuro consulted  LINES/TUBES: Right femoral CVL 1/20 >> 1/21 ETT 1/20 >>> 1/22  DISCUSSION: 72 yo female with altered mental status and inability to protect airway.  UDS positive for opiates.  MRI  brain, EEG unremarkable.  ASSESSMENT / PLAN:  Acute hypoxic respiratory failure with compromised airway. - proceed with extubation 9/34  Acute metabolic encephalopathy. Chronic pain. - likely from accidental opiate overdose - AEDs per neuro - monitor mental status - hold outpt zanaflex, ultram, trazodone  Hx of HTN. - monitor hemodynamics - hold outpt norvasc, hyzaar for now  Acute renal failure 2nd to hypovolemia. Mild rhabdomyolysis. - continue IV fluid - f/u BMET, CPK  DVT prophylaxis - SQ heparin SUP - Protonix Nutrition - NPO  Goals of care - full code  Updated pt's family at bedside  CC time 88 minutes  Chesley Mires, MD [place] 03/22/2017, 10:31 AM Pager:  [phone] After 3pm call: [phone]

## 2017-03-23 LAB — BASIC METABOLIC PANEL
ANION GAP: 12 (ref 5–15)
BUN: 6 mg/dL (ref 6–20)
CALCIUM: 8.5 mg/dL — AB (ref 8.9–10.3)
CO2: 19 mmol/L — AB (ref 22–32)
CREATININE: 0.72 mg/dL (ref 0.44–1.00)
Chloride: 109 mmol/L (ref 101–111)
GFR calc Af Amer: >60 mL/min (ref >60)
Glucose, Bld: 97 mg/dL (ref 65–99)
Potassium: 3 mmol/L — ABNORMAL LOW (ref 3.5–5.1)
SODIUM: 140 mmol/L (ref 135–145)

## 2017-03-23 LAB — GLUCOSE, CAPILLARY
GLUCOSE-CAPILLARY: 100 mg/dL — AB (ref 65–99)
Glucose-Capillary: 94 mg/dL (ref 65–99)

## 2017-03-23 LAB — CBC
HCT: 30.5 % — ABNORMAL LOW (ref 36.0–46.0)
HEMOGLOBIN: 10.5 g/dL — AB (ref 12.0–15.0)
MCH: 35 pg — AB (ref 26.0–34.0)
MCHC: 34.4 g/dL (ref 30.0–36.0)
MCV: 101.7 fL — ABNORMAL HIGH (ref 78.0–100.0)
PLATELETS: 250 10*3/uL (ref 150–400)
RBC: 3 MIL/uL — ABNORMAL LOW (ref 3.87–5.11)
RDW: 12.9 % (ref 11.5–15.5)
WBC: 10.4 10*3/uL (ref 4.0–10.5)

## 2017-03-23 LAB — CULTURE, RESPIRATORY W GRAM STAIN
Culture: NORMAL
Special Requests: NORMAL

## 2017-03-23 LAB — CULTURE, RESPIRATORY

## 2017-03-23 MED ORDER — NAPROXEN SODIUM 275 MG PO TABS
275.0000 mg | ORAL_TABLET | Freq: Two times a day (BID) | ORAL | Status: DC | PRN
  Filled 2017-03-23: qty 1

## 2017-03-23 MED ORDER — RAMELTEON 8 MG PO TABS: 8.0000 mg | ORAL_TABLET | Freq: Every day | ORAL | Status: DC

## 2017-03-23 MED ORDER — LOSARTAN POTASSIUM-HCTZ 100-25 MG PO TABS: 1.0000 | ORAL_TABLET | Freq: Every day | ORAL | Status: DC

## 2017-03-23 MED ORDER — HYDROCHLOROTHIAZIDE 25 MG PO TABS
25.0000 mg | ORAL_TABLET | Freq: Every day | ORAL | Status: DC
  Administered 2017-03-23 – 2017-03-25 (×3): 25 mg via ORAL
  Filled 2017-03-23 (×3): qty 1

## 2017-03-23 MED ORDER — NAPROXEN 250 MG PO TABS
250.0000 mg | ORAL_TABLET | Freq: Two times a day (BID) | ORAL | Status: DC | PRN
  Administered 2017-03-23 – 2017-03-24 (×4): 250 mg via ORAL
  Filled 2017-03-23 (×5): qty 1

## 2017-03-23 MED ORDER — LOSARTAN POTASSIUM 50 MG PO TABS
100.0000 mg | ORAL_TABLET | Freq: Every day | ORAL | Status: DC
  Administered 2017-03-23 – 2017-03-25 (×3): 100 mg via ORAL
  Filled 2017-03-23 (×3): qty 2

## 2017-03-23 MED ORDER — RAMELTEON 8 MG PO TABS
8.0000 mg | ORAL_TABLET | Freq: Every day | ORAL | Status: DC
  Administered 2017-03-23 – 2017-03-24 (×2): 8 mg via ORAL
  Filled 2017-03-23 (×2): qty 1

## 2017-03-23 MED ORDER — PHENOL 1.4 % MT LIQD
1.0000 | OROMUCOSAL | Status: DC | PRN
  Administered 2017-03-23: 1 via OROMUCOSAL
  Filled 2017-03-23: qty 177

## 2017-03-23 MED ORDER — QUETIAPINE FUMARATE 25 MG PO TABS
25.0000 mg | ORAL_TABLET | Freq: Every day | ORAL | Status: DC
  Administered 2017-03-23 – 2017-03-24 (×2): 25 mg via ORAL
  Filled 2017-03-23 (×2): qty 1

## 2017-03-23 MED ORDER — QUETIAPINE FUMARATE 25 MG PO TABS
25.0000 mg | ORAL_TABLET | Freq: Every evening | ORAL | Status: DC | PRN
  Filled 2017-03-23: qty 1

## 2017-03-23 MED ORDER — KETOROLAC TROMETHAMINE 30 MG/ML IJ SOLN
15.0000 mg | Freq: Two times a day (BID) | INTRAMUSCULAR | Status: AC | PRN
  Administered 2017-03-23 (×2): 15 mg via INTRAVENOUS
  Filled 2017-03-23 (×2): qty 1

## 2017-03-23 MED ORDER — AMLODIPINE BESYLATE 5 MG PO TABS
5.0000 mg | ORAL_TABLET | Freq: Every day | ORAL | Status: DC
  Administered 2017-03-23 – 2017-03-24 (×2): 5 mg via ORAL
  Filled 2017-03-23 (×2): qty 1

## 2017-03-23 MED ORDER — PANTOPRAZOLE SODIUM 40 MG PO TBEC
40.0000 mg | DELAYED_RELEASE_TABLET | Freq: Every day | ORAL | Status: DC
  Administered 2017-03-23 – 2017-03-24 (×2): 40 mg via ORAL
  Filled 2017-03-23 (×2): qty 1

## 2017-03-23 MED ORDER — POTASSIUM CHLORIDE CRYS ER 20 MEQ PO TBCR
40.0000 meq | EXTENDED_RELEASE_TABLET | Freq: Two times a day (BID) | ORAL | Status: AC
  Administered 2017-03-23 (×2): 40 meq via ORAL
  Filled 2017-03-23 (×2): qty 2

## 2017-03-23 NOTE — Progress Notes (Signed)
PULMONARY / CRITICAL CARE MEDICINE   Name: Heather Horton MRN: [id number] DOB: 02-01-1946    ADMISSION DATE:  03/20/2017 None available  REFERRING MD: Dr. Lita Mains, ER  CHIEF COMPLAINT: Altered mental status  BRIEF SUMMARY:   72 yo female with hx of chronic pain brought to ER with altered mental status and questionable seizure activity.  Required intubation for airway protection.  SUBJECTIVE:  Patient reports feeling well and has no complaints. AAO x 3. Family at bedside concerned that patient continues to be intermittently confused. No seizure activity per nursing staff.   VITAL SIGNS: BP (!) 165/97   Pulse 92   Temp 99.3 F (37.4 C)   Resp 18   Ht 5\' 6"  (1.676 m)   Wt 189 lb 6 oz (85.9 kg)   SpO2 99%   BMI 30.57 kg/m   VENTILATOR SETTINGS: Vent Mode: CPAP;PSV FiO2 (%):  [40 %] 40 % PEEP:  [5 cmH20] 5 cmH20 Pressure Support:  [5 cmH20] 5 cmH20  INTAKE / OUTPUT: I/O last 3 completed shifts: In: 4708.7 [I.V.:4246.5; NG/GT:252.3; IV [id number]:878] Out: 6767 [Urine:3462; Emesis/NG output:35]  PHYSICAL EXAMINATION:  General - No acute distress. Sitting up in bed eating breakfast.  Cardiac - RRR, S1 and S2 appreciated, no murmur Chest - Lungs CTAB Abd - soft, non tender, non-distended, +BS Ext - no edema Skin - no rashes Neuro - AAO x3  LABS:  BMET Recent Labs  Lab 03/21/17 0500 03/22/17 0722 03/23/17 0307  NA 136 140 140  K 4.2 4.0 3.0*  CL 105 112* 109  CO2 18* 17* 19*  BUN 25* 24* 6  CREATININE 1.22* 0.77 0.72  GLUCOSE 90 96 97    Electrolytes Recent Labs  Lab 03/20/17 1532 03/20/17 1815 03/21/17 0500 03/22/17 0722 03/23/17 0307  CALCIUM 9.1 8.6* 8.8* 8.4* 8.5*  MG 2.2 2.1 2.1  --   --   PHOS  --  3.1 3.4  --   --     CBC Recent Labs  Lab 03/21/17 0500 03/22/17 0722 03/23/17 0307  WBC 12.3* 10.2 10.4  HGB 12.8 11.1* 10.5*  HCT 36.6 33.8* 30.5*  PLT 265 209 250    Coag's Recent Labs  Lab 03/20/17 1532 03/20/17 1815  APTT  --   34  INR 1.08 1.12    Sepsis Markers Recent Labs  Lab 03/20/17 1559 03/20/17 1815 03/21/17 1120  LATICACIDVEN 4.29*  --  0.9  PROCALCITON  --  3.47  --     ABG Recent Labs  Lab 03/20/17 1551 03/21/17 0410  PHART 7.356 7.429  PCO2ART 35.5 32.2  PO2ART 169.0* 134*    Liver Enzymes Recent Labs  Lab 03/20/17 1532 03/20/17 1815  AST 43* 41  ALT 18 18  ALKPHOS 61 56  BILITOT 0.9 0.6  ALBUMIN 3.5 3.2*    Cardiac Enzymes Recent Labs  Lab 03/20/17 1815 03/20/17 2214 03/21/17 0500  TROPONINI <0.03 <0.03 <0.03    Glucose Recent Labs  Lab 03/22/17 0847 03/22/17 1126 03/22/17 1517 03/22/17 1925 03/22/17 2325 03/23/17 0357  GLUCAP 122* 92 93 88 99 94    Imaging No results found.   STUDIES:  1/20 CT head>>> no acute findings 1/20 MRI >>> normal 1/21 EEG>>> diffuse slowing  CULTURES: 03/20/2017 blood cultures x2>> no growth x 2 days 03/20/2017 sputum culture>> moderate gram positive cocci in pairs and rare gram negative rods, reincubated for better growth  03/20/2017 urine culture>> multiple species  ANTIBIOTICS: None  SIGNIFICANT EVENTS: 1/20 admit, neuro consulted  LINES/TUBES: Right femoral CVL 1/20 >> 1/21 ETT 1/20 >>> 1/22  DISCUSSION: 72 yo female with altered mental status and inability to protect airway.  UDS positive for opiates.  MRI brain, EEG unremarkable.  ASSESSMENT / PLAN:  Acute hypoxic respiratory failure with compromised airway. - extubated 1/22 - supplemental O2 prn   Acute metabolic encephalopathy - AAO x3 at present. Per family, continues to be intermittently confused. Likely ICU delirium.   Chronic pain. - AMS was likely from accidental opiate overdose - AEDs per neuro - monitor mental status - hold outpt zanaflex, ultram, trazodone - neurology following, appreciate recs  - PT eval  Hx of HTN - BP elevated  - monitor hemodynamics - resume home norvasc, hyzaar  Acute renal failure 2nd to hypovolemia - resolved   Mild rhabdomyolysis - CK downtrended to 908 on 1/22 Hypokalemia - K 3.0 today and repleted  - continue IV fluid - daily BMET, replete electrolytes   Insomnia -Seroquel 25 mg at bedtime as needed   DVT prophylaxis - SQ heparin SUP - Protonix Nutrition - soft diet, advance as tolerated   Goals of care - full code  Updated pt's family at bedside Plan is to transfer patient to [facility] for 1/24 am.

## 2017-03-23 NOTE — Care Management Note (Signed)
Case Management Note  Patient Details  Name: Heather Horton MRN: [id number] Date of Birth: 1945/09/05  Subjective/Objective:      Pt admitted post being found unresponsive - AMS              Action/Plan:  PTA from home.     Expected Discharge Date:                  Expected Discharge Plan:     In-House Referral:  Clinical Social Work  Discharge planning Services  CM Consult  Post Acute Care Choice:    Choice offered to:     DME Arranged:    DME Agency:     HH Arranged:    HH Agency:     Status of Service:     If discussed at [employer], dates discussed:    Additional Comments:  Maryclare Labrador, RN 03/23/2017, 3:56 PM

## 2017-03-24 ENCOUNTER — Other Ambulatory Visit: Payer: Self-pay

## 2017-03-24 DIAGNOSIS — T40601A Poisoning by unspecified narcotics, accidental (unintentional), initial encounter: Secondary | ICD-10-CM

## 2017-03-24 DIAGNOSIS — I10 Essential (primary) hypertension: Secondary | ICD-10-CM

## 2017-03-24 DIAGNOSIS — R4182 Altered mental status, unspecified: Secondary | ICD-10-CM

## 2017-03-24 LAB — URINE CULTURE

## 2017-03-24 MED ORDER — LEVETIRACETAM 500 MG PO TABS
500.0000 mg | ORAL_TABLET | Freq: Two times a day (BID) | ORAL | Status: DC
  Administered 2017-03-24 – 2017-03-25 (×3): 500 mg via ORAL
  Filled 2017-03-24 (×3): qty 1

## 2017-03-24 MED ORDER — BOOST / RESOURCE BREEZE PO LIQD CUSTOM
1.0000 | Freq: Three times a day (TID) | ORAL | Status: DC
Start: 2017-03-24 — End: 2017-03-25
  Administered 2017-03-24 – 2017-03-25 (×2): 1 via ORAL

## 2017-03-24 MED ORDER — POTASSIUM CHLORIDE CRYS ER 20 MEQ PO TBCR
40.0000 meq | EXTENDED_RELEASE_TABLET | Freq: Two times a day (BID) | ORAL | Status: AC
  Administered 2017-03-24 (×2): 40 meq via ORAL
  Filled 2017-03-24 (×2): qty 2

## 2017-03-24 NOTE — Clinical Social Work Note (Signed)
Clinical Social Work Assessment  Patient Details  Name: Heather Horton MRN: [id number] Date of Birth: 04-17-1945  Date of referral:  03/24/17               Reason for consult:  Facility Placement                Permission sought to share information with:  Chartered certified accountant granted to share information::  Yes, Verbal Permission Granted  Name::     Sport and exercise psychologist::  SNF  Relationship::  dtr  Contact Information:     Housing/Transportation Living arrangements for the past 2 months:  Single Family Home Source of Information:  Patient, Adult Children Patient Interpreter Needed:  None Criminal Activity/Legal Involvement Pertinent to Current Situation/Hospitalization:  No - Comment as needed Significant Relationships:  Adult Children Lives with:  Self Do you feel safe going back to the place where you live?  Yes Need for family participation in patient care:  No (Coment)  Care giving concerns:  Pt lives at home alone- dtr lives near by but works and not available throughout the day- son lives in [place] and unable to stay to assist.   Facilities manager / plan:  CSW spoke with pt and pt son and dtr at bedside.  Explained PT recommendation for SNF and SNF referral process.  Explained possible barriers to placement due to insurance and proximity to weekend.  Answered questions regarding home health limitations in providing care at home.  Employment status:  Retired Nurse, adult PT Recommendations:  [place] / Referral to community resources:  [place]  Patient/Family's Response to care:  Pt very resistant to going to SNF- feels as if she needs to go home and take care of things at home prior to transferring to another facility. CSW explained barriers to transferring from home and that this would likely slow down the process.  Pt children think SNF is a good idea and hopeful they can  convince pt to go.  Patient/Family's Understanding of and Emotional Response to Diagnosis, Current Treatment, and Prognosis:  Pt does not seem to have good understanding of her condition and limitations at this moment but is motivated to improve.  Emotional Assessment Appearance:  Appears stated age Attitude/Demeanor/Rapport:  Uncooperative Affect (typically observed):  Appropriate, Guarded Orientation:  Oriented to Self, Oriented to Place, Oriented to  Time, Oriented to Situation Alcohol / Substance use:  Not Applicable Psych involvement (Current and /or in the community):  No (Comment)  Discharge Needs  Concerns to be addressed:  Care Coordination Readmission within the last 30 days:  No Current discharge risk:  Physical Impairment Barriers to Discharge:  Continued Medical Work up   Jorge Ny, LCSW 03/24/2017, 1:14 PM

## 2017-03-24 NOTE — NC FL2 (Signed)
[place] LEVEL OF CARE SCREENING TOOL     IDENTIFICATION  Patient Name: Heather Horton Birthdate: 03/20/1945 Sex: female Admission Date (Current Location): 03/20/2017  [facility] and [place] Number:  Herbalist and Address:  [facility] [place], [place] [address] [place], [place] [address]      Provider Number: [id number]  Attending Physician Name and Address:  Kayleen Memos, DO  Relative Name and Phone Number:       Current Level of Care: Hospital Recommended Level of Care: [place] Prior Approval Number:    Date Approved/Denied:   PASRR Number: [id number]  Discharge Plan: SNF    Current Diagnoses: Patient Active Problem List   Diagnosis Date Noted  . Acute encephalopathy   . Respiratory failure ([place]) 03/20/2017  . Generalized anxiety disorder 07/22/2015  . Liver fibrosis 04/17/2015  . Family history of colon cancer 12/17/2014  . History of colonic polyps 12/17/2014  . Fibromyalgia 11/28/2014  . Need for pneumococcal vaccination 11/28/2014  . Rheumatoid arthritis ([place]) 10/17/2014  . Esophageal reflux 10/17/2014  . Chronic hepatitis C without hepatic coma ([place]) 10/17/2014  . Benign essential HTN 10/17/2014  . Smoker 10/17/2014  . Immune to hepatitis B 10/17/2014  . Need for hepatitis A vaccination 10/17/2014  . Chronic constipation   . Fibrocystic breast disease     Orientation RESPIRATION BLADDER Height & Weight     Self, Time, Situation, Place  Normal Incontinent Weight: 189 lb 6 oz (85.9 kg) Height:  '5\' 6"'$  (167.6 cm)  BEHAVIORAL SYMPTOMS/MOOD NEUROLOGICAL BOWEL NUTRITION STATUS      Continent Diet(see DC summary)  AMBULATORY STATUS COMMUNICATION OF NEEDS Skin   Limited Assist Verbally Normal                       Personal Care Assistance Level of Assistance  Bathing, Dressing Bathing Assistance: Limited assistance   Dressing Assistance: Limited assistance     Functional Limitations Info             SPECIAL CARE FACTORS FREQUENCY  PT (By licensed PT), OT (By licensed OT)     PT Frequency: 5/wk OT Frequency: 5/wk            Contractures      Additional Factors Info  Code Status, Allergies, Isolation Precautions, Psychotropic Code Status Info: FULL Allergies Info: Influenza Vaccines, Molds & Smuts, Pollen Extract, Aspirin Psychotropic Info: seroquel   Isolation Precautions Info: MRSA     Current Medications (03/24/2017):  This is the current hospital active medication list Current Facility-Administered Medications  Medication Dose Route Frequency Provider Last Rate Last Dose  . 0.9 %  sodium chloride infusion   Intravenous Continuous Chesley Mires, MD 75 mL/hr at 03/24/17 1103    . acetaminophen (TYLENOL) tablet 650 mg  650 mg Oral Q6H PRN Neva Seat, MD   650 mg at 03/24/17 0412  . amLODipine (NORVASC) tablet 5 mg  5 mg Oral Daily Shela Leff, MD   5 mg at 03/24/17 0930  . chlorhexidine gluconate (MEDLINE KIT) (PERIDEX) 0.12 % solution 15 mL  15 mL Mouth Rinse BID Mannam, Praveen, MD   15 mL at 03/23/17 0858  . Chlorhexidine Gluconate Cloth 2 % PADS 6 each  6 each Topical Q0600 Chesley Mires, MD   6 each at 03/24/17 0527  . heparin injection 5,000 Units  5,000 Units Subcutaneous Q8H Whiteheart, Cristal Ford, NP   5,000 Units at 03/24/17 0527  .  losartan (COZAAR) tablet 100 mg  100 mg Oral Daily Chesley Mires, MD   100 mg at 03/24/17 0981   And  . hydrochlorothiazide (HYDRODIURIL) tablet 25 mg  25 mg Oral Daily Chesley Mires, MD   25 mg at 03/24/17 1914  . levETIRAcetam (KEPPRA) tablet 500 mg  500 mg Oral BID Hammons, Theone Murdoch, RPH      . MEDLINE mouth rinse  15 mL Mouth Rinse QID Mannam, Praveen, MD   15 mL at 03/23/17 1513  . mupirocin ointment (BACTROBAN) 2 %   Nasal BID Mannam, Praveen, MD      . naproxen (NAPROSYN) tablet 250 mg  250 mg Oral BID PRN Chesley Mires, MD   250 mg at 03/24/17 1057  . pantoprazole (PROTONIX) EC tablet 40 mg  40 mg Oral  Q2200 Chesley Mires, MD   40 mg at 03/23/17 2311  . phenol (CHLORASEPTIC) mouth spray 1 spray  1 spray Mouth/Throat PRN Shela Leff, MD   1 spray at 03/23/17 1046  . potassium chloride SA (K-DUR,KLOR-CON) CR tablet 40 mEq  40 mEq Oral BID Irene Pap N, DO   40 mEq at 03/24/17 0930  . QUEtiapine (SEROQUEL) tablet 25 mg  25 mg Oral QHS Chesley Mires, MD   25 mg at 03/23/17 2110  . ramelteon (ROZEREM) tablet 8 mg  8 mg Oral QHS Katherine Roan, MD   8 mg at 03/23/17 7829     Discharge Medications: Please see discharge summary for a list of discharge medications.  Relevant Imaging Results:  Relevant Lab Results:   Additional Information SS#: [id number]  Jorge Ny, LCSW

## 2017-03-24 NOTE — Evaluation (Signed)
Physical Therapy Evaluation Patient Details Name: Heather Horton MRN: [id number] DOB: 25-Oct-1945 Today's Date: 03/24/2017   History of Present Illness  Pt is a 72 y.o. female found down, unresponsive at home and transferred to [facility] 03/20/17; intubated 1/20-1/22. Brain MRI and MRA shows no acute changes or significant stenosis. EEG unremarkable. PMH includes chronic pain, arthritis, HTN.    Clinical Impression  Pt presents with an overall decrease in functional mobility secondary to above. PTA, pt indep and lives alone; daughter available for PRN assist. Today, pt able to transfer and amb short distance with RW and minA; limited by fatigue and generalized weakness. Pt would benefit from continued acute PT services to maximize functional mobility and independence prior to d/c with SNF-level therapies.     Follow Up Recommendations SNF    Equipment Recommendations  (TBD next venue)    Recommendations for Other Services       Precautions / Restrictions Precautions Precautions: Fall Restrictions Weight Bearing Restrictions: No      Mobility  Bed Mobility Overal bed mobility: Needs Assistance             General bed mobility comments: Received sitting EOB with NT  Transfers Overall transfer level: Needs assistance Equipment used: Rolling walker (2 wheeled) Transfers: Sit to/from Stand Sit to Stand: Min guard;Min assist         General transfer comment: Able to stand on 3rd attempt with RW and min guard for balance; minA to steady RW.  Ambulation/Gait Ambulation/Gait assistance: Min assist Ambulation Distance (Feet): 20 Feet Assistive device: Rolling walker (2 wheeled) Gait Pattern/deviations: Step-to pattern;Decreased stride length;Antalgic Gait velocity: Decreased Gait velocity interpretation: <1.8 ft/sec, indicative of risk for recurrent falls General Gait Details: Slow, slightly unsteady amb with RW and minA for balance; 1x prolonged seated rest break secondary to  fatigue.   Stairs            Wheelchair Mobility    Modified Rankin (Stroke Patients Only)       Balance     Sitting balance-Leahy Scale: Fair       Standing balance-Leahy Scale: Poor                               Pertinent Vitals/Pain Pain Assessment: Faces Faces Pain Scale: Hurts little more Pain Location: Chronic LBP Pain Descriptors / Indicators: Sore Pain Intervention(s): Monitored during session    Home Living Family/patient expects to be discharged to:: Private residence Living Arrangements: Alone Available Help at Discharge: Family;Available PRN/intermittently Type of Home: House Home Access: Stairs to enter   [employer] of Steps: 1 threshold Home Layout: One level Home Equipment: Walker - 4 wheels Additional Comments: Daughter works during day, but can stay with pt after work    Prior Function Level of Independence: Needs assistance   Gait / Transfers Assistance Needed: Indep with mobility; intermittent use of RW. Does not drive; daughter drives and assists with household ADLs as needed           Hand Dominance        Extremity/Trunk Assessment   Upper Extremity Assessment Upper Extremity Assessment: Generalized weakness    Lower Extremity Assessment Lower Extremity Assessment: Generalized weakness       Communication   Communication: Expressive difficulties(Soft, hoarse voice post-extub)  Cognition Arousal/Alertness: Awake/alert Behavior During Therapy: WFL for tasks assessed/performed Overall Cognitive Status: Within Functional Limits for tasks assessed  General Comments General comments (skin integrity, edema, etc.): Daughter and son present throughout session    Exercises     Assessment/Plan    PT Assessment Patient needs continued PT services  PT Problem List Decreased strength;Decreased activity tolerance;Decreased balance;Decreased  mobility       PT Treatment Interventions DME instruction;Gait training;Stair training;Functional mobility training;Therapeutic activities;Therapeutic exercise;Balance training;Patient/family education    PT Goals (Current goals can be found in the Care Plan section)  Acute Rehab PT Goals Patient Stated Goal: Return home PT Goal Formulation: With patient Time For Goal Achievement: 04/07/17 Potential to Achieve Goals: Good    Frequency Min 2X/week   Barriers to discharge Decreased caregiver support      Co-evaluation               AM-PAC PT "6 Clicks" Daily Activity  Outcome Measure Difficulty turning over in bed (including adjusting bedclothes, sheets and blankets)?: A Little Difficulty moving from lying on back to sitting on the side of the bed? : A Little Difficulty sitting down on and standing up from a chair with arms (e.g., wheelchair, bedside commode, etc,.)?: Unable Help needed moving to and from a bed to chair (including a wheelchair)?: A Little Help needed walking in hospital room?: A Little Help needed climbing 3-5 steps with a railing? : A Lot 6 Click Score: 15    End of Session Equipment Utilized During Treatment: Gait belt Activity Tolerance: Patient tolerated treatment well;Patient limited by fatigue Patient left: in chair;with call bell/phone within reach;with family/visitor present Nurse Communication: Mobility status PT Visit Diagnosis: Other abnormalities of gait and mobility (R26.89);Muscle weakness (generalized) (M62.81)    Time: 9509-3267 PT Time Calculation (min) (ACUTE ONLY): 23 min   Charges:   PT Evaluation $PT Eval Moderate Complexity: 1 Mod PT Treatments $Gait Training: 8-22 mins   PT G Codes:       Mabeline Caras, PT, DPT Acute Rehab Services  Pager: [place] 03/24/2017, 9:49 AM

## 2017-03-24 NOTE — Progress Notes (Signed)
PROGRESS NOTE  Heather Horton VEH:[id number] DOB: 11-May-1945 DOA: 03/20/2017 PCP: [name], PA-C  HPI/Recap of past 24 hours: 72 yo female with hx of chronic pain brought to ER with altered mental status and questionable seizure activity. Suspected accidental opiates overdose. Required intubation for airway protection. Extubated 03/22/17 and transferred to medical floor on 03/23/17. Medicine team asked to follow on 03/24/17.  03/24/17: patient seen and examined with family members at her bedside. Inquires about restarting her pain medications. Informed the patient that polypharmacy was part of the reason for her presenting symptoms. Patient will need referral to pain clinic for management of chronic pain syndrome. Denies chest pain, palpitations or dyspnea.   Assessment/Plan: Active Problems:   Respiratory failure (HCC)   Acute encephalopathy  Acute hypoxic respiratory failure with compromised airway. - extubated 1/22 - 03/24/17: continue supplemental O2 prn  - sats ok on RA  Acute metabolic encephalopathy, resolving  - AAO x3 at present.  - Per family, continues to be intermittently confused.  - Likely ICU delirium - MRI/MRA brain no acute changes, no evidence of stroke   Possible seizure activity at presentation in the setting of opiate overuse -EEG showed no epileptic form -keppra po BID -MRI/MRA brian no acute changes -neurology following -monitor  Chronic pain syndrome. - AMS was likely from accidental opiate overdose - 03/24/17: continue to hold outpt zanaflex, ultram, trazodone - neurology following, appreciate recs  - Will need referral to pain clinic for management of her chronic pain syndrome  HTN, improving - continue norvasc, hyzaar  Acute renal failure 2nd to hypovolemia - resolved - avoid nephrotoxic meds/hypotension/dehydration - monitor urine oputput - BMP am   Mild rhabdomyolysis - CPK downtrended to 908 on 1/22 - gentle IV fluid hydration - CPK  level am  Hypokalemia  - K 3.0 today and repleted  - BMP am  Insomnia - Seroquel 25 mg at bedtime as needed   Generalized weakness - most likely multifactorial, from ICU related vs others - PT evaluate and treat - encourage increase po calorie intake  SEVERITY: Moderate to severe. High risk for decompensation due to advanced age and commorbidities.    Code Status: full   Family Communication: with son and daughter at bedside. All questions answered to their satisafcation.  Disposition Plan: will stay another midnight to replete electrolytes and continue antiseizure medications with close monitoring of symptomatology. Still too weak and oral intake needs to be adequate.   Consultants:  CCM  Procedures:  Extubated 03/22/17  Antimicrobials:  none  DVT prophylaxis:  SCDs   Objective: Vitals:   03/23/17 1800 03/23/17 1900 03/23/17 2254 03/24/17 0525  BP: (!) 147/71 129/62 (!) 165/99 (!) 146/58  Pulse: 93 91 88 93  Resp: (!) '24 14 18 14  '$ Temp: 99.5 F (37.5 C) 99.7 F (37.6 C) 98.8 F (37.1 C) 98.5 F (36.9 C)  TempSrc:   Oral Oral  SpO2: 100% 100% 99% 98%  Weight:      Height:        Intake/Output Summary (Last 24 hours) at 03/24/2017 0730 Last data filed at 03/24/2017 0603 Gross per 24 hour  Intake 1803.75 ml  Output 2600 ml  Net -796.25 ml   Filed Weights   03/21/17 0500 03/22/17 0500 03/23/17 0500  Weight: 87.1 kg (192 lb 0.3 oz) 84.5 kg (186 lb 4.6 oz) 85.9 kg (189 lb 6 oz)    Exam:   General:  72 yo [name] WD WN NAD A&O x3  Cardiovascular: RRR  no rubs or gallops   Respiratory: CTA no rales or wheezes  Abdomen: soft NT ND NBS x4  Musculoskeletal: non focal moves all 4 extremities but generalized weakness  Skin: no rash noted  Psychiatry: mood is anxious    Data Reviewed: CBC: Recent Labs  Lab 03/20/17 1532 03/20/17 1559 03/20/17 1815 03/21/17 0500 03/22/17 0722 03/23/17 0307  WBC 10.9*  --  11.2* 12.3* 10.2 10.4  NEUTROABS  9.5*  --   --  10.2*  --   --   HGB 14.6 16.0* 13.7 12.8 11.1* 10.5*  HCT 41.3 47.0* 39.2 36.6 33.8* 30.5*  MCV 101.5*  --  101.3* 101.4* 106.0* 101.7*  PLT 282  --  279 265 209 295   Basic Metabolic Panel: Recent Labs  Lab 03/20/17 1532 03/20/17 1559 03/20/17 1815 03/21/17 0500 03/22/17 0722 03/23/17 0307  NA 137 137 135 136 140 140  K 4.6 5.0 3.9 4.2 4.0 3.0*  CL 104 106 103 105 112* 109  CO2 19*  --  19* 18* 17* 19*  GLUCOSE 106* 102* 104* 90 96 97  BUN 18 28* 19 25* 24* 6  CREATININE 1.04* 0.80 0.88 1.22* 0.77 0.72  CALCIUM 9.1  --  8.6* 8.8* 8.4* 8.5*  MG 2.2  --  2.1 2.1  --   --   PHOS  --   --  3.1 3.4  --   --    GFR: Estimated Creatinine Clearance: 72.2 mL/min (by C-G formula based on SCr of 0.72 mg/dL). Liver Function Tests: Recent Labs  Lab 03/20/17 1532 03/20/17 1815  AST 43* 41  ALT 18 18  ALKPHOS 61 56  BILITOT 0.9 0.6  PROT 7.2 6.7  ALBUMIN 3.5 3.2*   Recent Labs  Lab 03/20/17 1815  LIPASE 42  AMYLASE 108*   Recent Labs  Lab 03/21/17 0919  AMMONIA 10   Coagulation Profile: Recent Labs  Lab 03/20/17 1532 03/20/17 1815  INR 1.08 1.12   Cardiac Enzymes: Recent Labs  Lab 03/20/17 1532 03/20/17 1815 03/20/17 2214 03/21/17 0500 03/22/17 0722  CKTOTAL 1,528*  --   --  1,692* 908*  TROPONINI <0.03 <0.03 <0.03 <0.03  --    BNP (last 3 results) No results for input(s): PROBNP in the last 8760 hours. HbA1C: No results for input(s): HGBA1C in the last 72 hours. CBG: Recent Labs  Lab 03/22/17 1517 03/22/17 1925 03/22/17 2325 03/23/17 0357 03/23/17 1203  GLUCAP 93 88 99 94 100*   Lipid Profile: No results for input(s): CHOL, HDL, LDLCALC, TRIG, CHOLHDL, LDLDIRECT in the last 72 hours. Thyroid Function Tests: No results for input(s): TSH, T4TOTAL, FREET4, T3FREE, THYROIDAB in the last 72 hours. Anemia Panel: No results for input(s): VITAMINB12, FOLATE, FERRITIN, TIBC, IRON, RETICCTPCT in the last 72 hours. Urine analysis:      Component Value Date/Time   COLORURINE AMBER (A) 03/20/2017 1555   APPEARANCEUR CLOUDY (A) 03/20/2017 1555   LABSPEC 1.019 03/20/2017 1555   PHURINE 5.0 03/20/2017 1555   GLUCOSEU NEGATIVE 03/20/2017 1555   HGBUR MODERATE (A) 03/20/2017 [place] 03/20/2017 1555   BILIRUBINUR neg 05/11/2012 1638   KETONESUR 5 (A) 03/20/2017 1555   PROTEINUR 100 (A) 03/20/2017 1555   UROBILINOGEN 0.2 05/11/2012 1638   NITRITE NEGATIVE 03/20/2017 1555   LEUKOCYTESUR NEGATIVE 03/20/2017 1555   Sepsis Labs: '@LABRCNTIP'$ (procalcitonin:4,lacticidven:4)  ) Recent Results (from the past 240 hour(s))  Urine culture     Status: Abnormal   Collection Time: 03/20/17  3:39  PM  Result Value Ref Range Status   Specimen Description URINE, RANDOM  Final   Special Requests Normal  Final   Culture MULTIPLE SPECIES PRESENT, SUGGEST RECOLLECTION (A)  Final   Report Status 03/21/2017 FINAL  Final  MRSA PCR Screening     Status: Abnormal   Collection Time: 03/20/17  6:45 PM  Result Value Ref Range Status   MRSA by PCR POSITIVE (A) NEGATIVE Final    Comment:        The GeneXpert MRSA Assay (FDA approved for NASAL specimens only), is one component of a comprehensive MRSA colonization surveillance program. It is not intended to diagnose MRSA infection nor to guide or monitor treatment for MRSA infections. RESULT CALLED TO, READ BACK BY AND VERIFIED WITH: KJONES,RN '@2040'$  03/20/17 BY LHOWARD   Culture, blood (routine x 2)     Status: None (Preliminary result)   Collection Time: 03/20/17  7:08 PM  Result Value Ref Range Status   Specimen Description BLOOD BLOOD LEFT HAND  Final   Special Requests IN PEDIATRIC BOTTLE Blood Culture adequate volume  Final   Culture NO GROWTH 3 DAYS  Final   Report Status PENDING  Incomplete  Culture, blood (routine x 2)     Status: None (Preliminary result)   Collection Time: 03/20/17  7:20 PM  Result Value Ref Range Status   Specimen Description BLOOD BLOOD  RIGHT HAND  Final   Special Requests IN PEDIATRIC BOTTLE Blood Culture adequate volume  Final   Culture NO GROWTH 3 DAYS  Final   Report Status PENDING  Incomplete  Culture, respiratory (tracheal aspirate)     Status: None   Collection Time: 03/21/17  7:36 AM  Result Value Ref Range Status   Specimen Description TRACHEAL ASPIRATE  Final   Special Requests Normal  Final   Gram Stain   Final    RARE WBC PRESENT, PREDOMINANTLY PMN MODERATE GRAM POSITIVE COCCI IN PAIRS RARE GRAM NEGATIVE RODS    Culture Consistent with normal respiratory flora.  Final   Report Status 03/23/2017 FINAL  Final  Culture, Urine     Status: Abnormal   Collection Time: 03/21/17  7:36 AM  Result Value Ref Range Status   Specimen Description URINE, CATHETERIZED  Final   Special Requests Normal  Final   Culture MULTIPLE SPECIES PRESENT, SUGGEST RECOLLECTION (A)  Final   Report Status 03/22/2017 FINAL  Final  Culture, Urine     Status: Abnormal   Collection Time: 03/22/17  6:42 PM  Result Value Ref Range Status   Specimen Description URINE, CATHETERIZED  Final   Special Requests NONE  Final   Culture MULTIPLE SPECIES PRESENT, SUGGEST RECOLLECTION (A)  Final   Report Status 03/24/2017 FINAL  Final      Studies: No results found.  Scheduled Meds: . amLODipine  5 mg Oral Daily  . chlorhexidine gluconate (MEDLINE KIT)  15 mL Mouth Rinse BID  . Chlorhexidine Gluconate Cloth  6 each Topical Q0600  . heparin injection (subcutaneous)  5,000 Units Subcutaneous Q8H  . losartan  100 mg Oral Daily   And  . hydrochlorothiazide  25 mg Oral Daily  . mouth rinse  15 mL Mouth Rinse QID  . mupirocin ointment   Nasal BID  . pantoprazole  40 mg Oral Q2200  . QUEtiapine  25 mg Oral QHS  . ramelteon  8 mg Oral QHS    Continuous Infusions: . sodium chloride 75 mL/hr at 03/24/17 0357  . levETIRAcetam Stopped (03/23/17  2313)     LOS: 4 days     Kayleen Memos, MD [facility] Pager [phone]  If  7PM-7AM, please contact night-coverage [email or web address] Password [email or web address] 03/24/2017, 7:30 AM

## 2017-03-24 NOTE — Progress Notes (Signed)
Nutrition Follow-up  DOCUMENTATION CODES:   Obesity unspecified  INTERVENTION:  Boost [place]reeze po TID, each supplement provides 250 kcal and 9 grams of protein  NUTRITION DIAGNOSIS:   Inadequate oral intake related to poor appetite as evidenced by per patient/family report.  GOAL:   Patient will meet greater than or equal to 90% of their needs  MONITOR:   PO intake, I & O's, Supplement acceptance, Labs, Weight trends  ASSESSMENT:   72 yo female with PMH of bronchitis, HTN, hepatitis C, arthritis, bursitis, chronic constipation, cervical cancer, HLD, colon polyps, HLD who was admitted on 1/20 with AMS, ? seizure, and respiratory failure requiring intubation.  Spoke with patient, family at bedside. Did not eat any breakfast but according to meal completion she ate 100% at lunch. Otherwise feels ok. Needs re-estimated.  Labs reviewed:  K+ 3.0  Medications reviewed and include:  40 K+ BID NS at 66mL/hr  Diet Order:  Diet Heart Room service appropriate? Yes; Fluid consistency: Thin  EDUCATION NEEDS:   No education needs have been identified at this time  Skin:  Skin Assessment: Reviewed RN Assessment  Last BM:  03/23/2017  Height:   Ht Readings from Last 1 Encounters:  03/20/17 5\' 6"  (1.676 m)    Weight:   Wt Readings from Last 1 Encounters:  03/23/17 189 lb 6 oz (85.9 kg)    Ideal Body Weight:  59.1 kg  BMI:  Body mass index is 30.57 kg/m.  Estimated Nutritional Needs:   Kcal:  1680-1960 calories  Protein:  100-120 grams  Fluid:  1.7-2L  Satira Anis. Nattalie Santiesteban, MS, RD LDN Inpatient Clinical Dietitian Pager [phone]

## 2017-03-25 DIAGNOSIS — M797 Fibromyalgia: Secondary | ICD-10-CM

## 2017-03-25 DIAGNOSIS — G894 Chronic pain syndrome: Secondary | ICD-10-CM

## 2017-03-25 DIAGNOSIS — F419 Anxiety disorder, unspecified: Secondary | ICD-10-CM

## 2017-03-25 LAB — CULTURE, BLOOD (ROUTINE X 2)
CULTURE: NO GROWTH
Culture: NO GROWTH
Special Requests: ADEQUATE
Special Requests: ADEQUATE

## 2017-03-25 LAB — BASIC METABOLIC PANEL
Anion gap: 11 (ref 5–15)
BUN: 9 mg/dL (ref 6–20)
CO2: 18 mmol/L — ABNORMAL LOW (ref 22–32)
CREATININE: 0.69 mg/dL (ref 0.44–1.00)
Calcium: 8.8 mg/dL — ABNORMAL LOW (ref 8.9–10.3)
Chloride: 109 mmol/L (ref 101–111)
GFR calc Af Amer: >60 mL/min (ref >60)
GFR calc non Af Amer: >60 mL/min (ref >60)
Glucose, Bld: 125 mg/dL — ABNORMAL HIGH (ref 65–99)
Potassium: 3.4 mmol/L — ABNORMAL LOW (ref 3.5–5.1)
SODIUM: 138 mmol/L (ref 135–145)

## 2017-03-25 LAB — CBC
HCT: 31.3 % — ABNORMAL LOW (ref 36.0–46.0)
Hemoglobin: 10.8 g/dL — ABNORMAL LOW (ref 12.0–15.0)
MCH: 34.7 pg — AB (ref 26.0–34.0)
MCHC: 34.5 g/dL (ref 30.0–36.0)
MCV: 100.6 fL — ABNORMAL HIGH (ref 78.0–100.0)
PLATELETS: 283 10*3/uL (ref 150–400)
RBC: 3.11 MIL/uL — ABNORMAL LOW (ref 3.87–5.11)
RDW: 12.8 % (ref 11.5–15.5)
WBC: 7.1 10*3/uL (ref 4.0–10.5)

## 2017-03-25 LAB — GLUCOSE, CAPILLARY: GLUCOSE-CAPILLARY: 105 mg/dL — AB (ref 65–99)

## 2017-03-25 LAB — CK: Total CK: 348 U/L — ABNORMAL HIGH (ref 38–234)

## 2017-03-25 MED ORDER — AMLODIPINE BESYLATE 10 MG PO TABS
10.0000 mg | ORAL_TABLET | Freq: Every day | ORAL | Status: DC
  Administered 2017-03-25: 10 mg via ORAL
  Filled 2017-03-25: qty 1

## 2017-03-25 MED ORDER — LEVETIRACETAM 500 MG PO TABS: 500.0000 mg | ORAL_TABLET | Freq: Two times a day (BID) | ORAL | 0 refills | Status: DC

## 2017-03-25 MED ORDER — QUETIAPINE FUMARATE 25 MG PO TABS: 25.0000 mg | ORAL_TABLET | Freq: Every day | ORAL | 0 refills | Status: DC

## 2017-03-25 MED ORDER — SENNA 8.6 MG PO TABS
1.0000 | ORAL_TABLET | Freq: Two times a day (BID) | ORAL | Status: DC
  Administered 2017-03-25: 8.6 mg via ORAL
  Filled 2017-03-25: qty 1

## 2017-03-25 MED ORDER — MAGNESIUM HYDROXIDE 400 MG/5ML PO SUSP
15.0000 mL | Freq: Once | ORAL | Status: AC
  Administered 2017-03-25: 15 mL via ORAL
  Filled 2017-03-25: qty 30

## 2017-03-25 MED ORDER — NICOTINE 21 MG/24HR TD PT24
21.0000 mg | MEDICATED_PATCH | Freq: Every day | TRANSDERMAL | Status: DC
  Administered 2017-03-25: 21 mg via TRANSDERMAL
  Filled 2017-03-25: qty 1

## 2017-03-25 MED ORDER — NICOTINE 21 MG/24HR TD PT24: 21.0000 mg | MEDICATED_PATCH | Freq: Every day | TRANSDERMAL | 0 refills | Status: DC

## 2017-03-25 MED ORDER — PANTOPRAZOLE SODIUM 40 MG PO TBEC: 40.0000 mg | DELAYED_RELEASE_TABLET | Freq: Every day | ORAL | 0 refills | Status: DC

## 2017-03-25 MED ORDER — AMLODIPINE BESYLATE 10 MG PO TABS: 10.0000 mg | ORAL_TABLET | Freq: Every day | ORAL | 0 refills | Status: DC

## 2017-03-25 MED ORDER — POTASSIUM CHLORIDE CRYS ER 20 MEQ PO TBCR
40.0000 meq | EXTENDED_RELEASE_TABLET | Freq: Once | ORAL | Status: AC
  Administered 2017-03-25: 40 meq via ORAL
  Filled 2017-03-25: qty 2

## 2017-03-25 NOTE — Discharge Instructions (Addendum)

## 2017-03-25 NOTE — Clinical Social Work Note (Signed)
Clinical Social Worker following patient for support and discharge planning needs.  Patient has worked with therapies again today and is no longer in need of SNF placement.  Clinical Social Worker will sign off for now as social work intervention is no longer needed. Please consult [place] again if new need arises.  Barbette Or, [place]

## 2017-03-25 NOTE — Progress Notes (Signed)
Physical Therapy Treatment Patient Details Name: Heather Horton MRN: [id number] DOB: [birth year] Today's Date: 03/25/2017    History of Present Illness Pt is a 72 y.o. female found down, unresponsive at home and transferred to [facility] 03/20/17; intubated 1/20-1/22. Brain MRI and MRA shows no acute changes or significant stenosis. EEG unremarkable. PMH includes chronic pain, arthritis, HTN.    PT Comments    Pt demonstrates significant improvement in mobility this session. She is modified independent with bed mobility and transfers. Supervision provided for ambulation 350 feet with RW. No physical assist needed. No LOB noted. Pt does continue to demonstrate mild confusion. She hallucinated during ambulation, thinking she saw a dog in the hallway. These deficits result in decreased safety awareness. Pt's daughter present in room.  She feels pt is now at her baseline for mobility.  She reports family will be providing 24-hour assist upon discharge. Discharge recommendations updated to home with 24-hour assist.   Follow Up Recommendations  No PT follow up;Supervision/Assistance - 24 hour     Equipment Recommendations  None recommended by PT    Recommendations for Other Services       Precautions / Restrictions Precautions Precautions: Fall    Mobility  Bed Mobility Overal bed mobility: Modified Independent                Transfers Overall transfer level: Modified independent Equipment used: Ambulation equipment used Transfers: Stand Pivot Transfers Sit to Stand: Modified independent (Device/Increase time) Stand pivot transfers: Modified independent (Device/Increase time)       General transfer comment: No physical assist. Pt demo safe technique.  Ambulation/Gait Ambulation/Gait assistance: Supervision Ambulation Distance (Feet): 350 Feet Assistive device: Rolling walker (2 wheeled) Gait Pattern/deviations: WFL(Within Functional Limits) Gait velocity: WFL Gait velocity  interpretation: >2.62 ft/sec, indicative of independent community ambulator General Gait Details: steady Writer    Modified Rankin (Stroke Patients Only)       Balance Overall balance assessment: History of Falls Sitting-balance support: No upper extremity supported;Feet supported Sitting balance-Leahy Scale: Good     Standing balance support: No upper extremity supported;During functional activity Standing balance-Leahy Scale: Fair                              Cognition Arousal/Alertness: Awake/alert Behavior During Therapy: WFL for tasks assessed/performed Overall Cognitive Status: Within Functional Limits for tasks assessed                                 General Comments: Pt demo hallucinations during ambulation. She thought she saw a dog in the hallway.      Exercises      General Comments        Pertinent Vitals/Pain Pain Assessment: No/denies pain    Home Living                      Prior Function            PT Goals (current goals can now be found in the care plan section) Acute Rehab PT Goals Patient Stated Goal: Return home PT Goal Formulation: With patient/family Time For Goal Achievement: 04/07/17 Potential to Achieve Goals: Good Progress towards PT goals: Goals met and updated - see care plan    Frequency    Min 3X/week  PT Plan Discharge plan needs to be updated;Frequency needs to be updated    Co-evaluation              AM-PAC PT "6 Clicks" Daily Activity  Outcome Measure  Difficulty turning over in bed (including adjusting bedclothes, sheets and blankets)?: None Difficulty moving from lying on back to sitting on the side of the bed? : None Difficulty sitting down on and standing up from a chair with arms (e.g., wheelchair, bedside commode, etc,.)?: None Help needed moving to and from a bed to chair (including a wheelchair)?: None Help  needed walking in hospital room?: None Help needed climbing 3-5 steps with a railing? : A Little 6 Click Score: 23    End of Session Equipment Utilized During Treatment: Gait belt Activity Tolerance: Patient tolerated treatment well Patient left: in bed;with family/visitor present Nurse Communication: Mobility status PT Visit Diagnosis: Other abnormalities of gait and mobility (R26.89);Muscle weakness (generalized) (M62.81)     Time: 1415-9733 PT Time Calculation (min) (ACUTE ONLY): 14 min  Charges:  $Gait Training: 8-22 mins                    G Codes:       Lorrin Goodell, PT  Office # [phone] Pager [phone]    Heather Horton 03/25/2017, 1:11 PM

## 2017-03-25 NOTE — Discharge Summary (Signed)
Discharge Summary  Heather Horton ZOX:[id number] DOB: Dec 08, 1945  PCP: [name], PA-C  Admit date: 03/20/2017 Discharge date: 03/25/2017  Time spent: 25 MINUTES  Recommendations for Outpatient Follow-up:  1. Follow up with PCP post hospitalization 2. Avoid narcotics 3. Take your medications as prescribed 4. Fall precaution  Discharge Diagnoses:  Active Hospital Problems   Diagnosis Date Noted  . Acute encephalopathy   . Respiratory failure ([place]) 03/20/2017    Resolved Hospital Problems  No resolved problems to display.    Discharge Condition: Stable  Diet recommendation: Resume previous diet  Vitals:   03/25/17 0528 03/25/17 0839  BP: (!) 161/78 112/60  Pulse: 86   Resp: 18   Temp: 98.2 F (36.8 C)   SpO2: 99%     History of present illness:  72 yo female with hx of chronic pain, HTN, GERD, hep C, liver fibrosis, fibromyalgia, generalized anxiety who was brought to ER with altered mental status and questionable seizure activity. Suspecion for accidental opiates overdose.Required intubation for airway protection. Extubated 03/22/17 and transferred to medical floor on 03/23/17. Medicine team asked to follow on 03/24/17. Hospital course with remarkable improvement after increasing po calorie intake. PT evaluated and followed with no recommendation for outpatient PT.   On the day of discharge the patient was hemodynamically stable. She will need to follow up with her PCP post hospitalization. She was counseled to seek referral for a pain specialist from her PCP and to avoid narcotics at this time. She understands and agrees to plan.   Hospital Course:  Active Problems:   Respiratory failure (HCC)   Acute encephalopathy  Acute hypoxic respiratory failure with compromised airway. -extubated 1/22 - 03/24/17: continue supplemental O2 prn - no longer hypoxic  Acute metabolic encephalopathy/ ICU delirium, resolved - AAO x3 at present.  - MRI/MRA brain no acute  changes, no evidence of stroke   Possible seizure activity at presentation in the setting of opiate overuse -EEG showed no epileptic form -continue keppra po BID -MRI/MRA brian no acute changes -neurology followed  Chronic pain syndrome. -AMS waslikely from accidental opiate overdose - 03/24/17: continue to hold outpt zanaflex, ultram, trazodone - avoid narcotics  HTN, improving -continue norvasc, hyzaar  Acute renal failure 2nd to hypovolemia- resolved - avoid nephrotoxic meds/hypotension/dehydration - monitor urine oputput  Mild rhabdomyolysis - CPK downtrended to 908 on 1/22 - gentle IV fluid hydration - CPK level am  Hypokalemia, resolving  - K 3.4 from 3.0 and repleted - follow with PCP  Insomnia - Seroquel 25 mg at bedtime as needed  Generalized weakness, improving - encourage increase po calorie intake   Procedures:  exubation 03/22/17  Consultations:  CCM  Discharge Exam: BP 112/60   Pulse 86   Temp 98.2 F (36.8 C) (Oral)   Resp 18   Ht 5\' 6"  (1.676 m)   Wt 89.3 kg (196 lb 13.9 oz)   SpO2 99%   BMI 31.78 kg/m   General: 72 yo [name] WD WN NAD A&O x 3 Cardiovascular: RRR no rubs or gallops Respiratory: CTA no wheezes or rales   Discharge Instructions You were cared for by a hospitalist during your hospital stay. If you have any questions about your discharge medications or the care you received while you were in the hospital after you are discharged, you can call the unit and asked to speak with the hospitalist on call if the hospitalist that took care of you is not available. Once you are discharged, your primary care physician  will handle any further medical issues. Please note that NO REFILLS for any discharge medications will be authorized once you are discharged, as it is imperative that you return to your primary care physician (or establish a relationship with a primary care physician if you do not have one) for your aftercare  needs so that they can reassess your need for medications and monitor your lab values.   Allergies as of 03/25/2017      Reactions   Influenza Vaccines Other (See Comments)   Molds & Smuts Other (See Comments)   Per allergy test   Pollen Extract Other (See Comments)   Per allergy test   Aspirin Nausea And Vomiting, Rash      Medication List    STOP taking these medications   omeprazole 40 MG capsule Commonly known as:  PRILOSEC   tiZANidine 4 MG capsule Commonly known as:  ZANAFLEX   traMADol 50 MG tablet Commonly known as:  ULTRAM   traZODone 100 MG tablet Commonly known as:  DESYREL     TAKE these medications   albuterol 108 (90 Base) MCG/ACT inhaler Commonly known as:  PROVENTIL HFA;VENTOLIN HFA Inhale 2 puffs into the lungs every 6 (six) hours as needed for wheezing.   amLODipine 10 MG tablet Commonly known as:  NORVASC Take 1 tablet (10 mg total) by mouth daily. Start taking on:  03/26/2017 What changed:    medication strength  how much to take   levETIRAcetam 500 MG tablet Commonly known as:  KEPPRA Take 1 tablet (500 mg total) by mouth 2 (two) times daily.   losartan-hydrochlorothiazide 100-25 MG tablet Commonly known as:  HYZAAR Take 1 tablet by mouth daily.   naproxen sodium 220 MG tablet Commonly known as:  ALEVE Take 220 mg by mouth as needed (pain).   nicotine 21 mg/24hr patch Commonly known as:  NICODERM CQ - dosed in mg/24 hours Place 1 patch (21 mg total) onto the skin daily. Start taking on:  03/26/2017   pantoprazole 40 MG tablet Commonly known as:  PROTONIX Take 1 tablet (40 mg total) by mouth daily at 10 pm.   QUEtiapine 25 MG tablet Commonly known as:  SEROQUEL Take 1 tablet (25 mg total) by mouth at bedtime.      Allergies  Allergen Reactions  . Influenza Vaccines Other (See Comments)  . Molds & Smuts Other (See Comments)    Per allergy test  . Pollen Extract Other (See Comments)    Per allergy test  . Aspirin Nausea And  Vomiting and Rash   Follow-up Information    Heather Horton, Chelle, PA-C Follow up.   Specialty:  Family Medicine Contact information: [address] [place] [address] [phone]        Heather Portland, Heather Horton Follow up.   Specialty:  Neurology Contact information: [place] [address] [phone]            The results of significant diagnostics from this hospitalization (including imaging, microbiology, ancillary and laboratory) are listed below for reference.    Significant Diagnostic Studies: Ct Head Wo Contrast  Result Date: 03/20/2017 CLINICAL DATA:  Found on floor.  Unresponsive. EXAM: CT HEAD WITHOUT CONTRAST CT CERVICAL SPINE WITHOUT CONTRAST TECHNIQUE: Multidetector CT imaging of the head and cervical spine was performed following the standard protocol without intravenous contrast. Multiplanar CT image reconstructions of the cervical spine were also generated. COMPARISON:  None. FINDINGS: CT HEAD FINDINGS Brain: No evidence of acute infarction, hemorrhage,  hydrocephalus, extra-axial collection or mass lesion/mass effect. Vascular: Negative for hyperdense vessel Skull: Negative Sinuses/Orbits: Negative Other: The patient is intubated. CT CERVICAL SPINE FINDINGS Alignment: Scoliosis.  Normal alignment. Skull base and vertebrae: Negative for fracture Soft tissues and spinal canal: Negative for mass or adenopathy. Disc levels: Disc degeneration and disc bulging at C4-5. Disc degeneration and spurring at C5-6 and C6-7 causing spinal and foraminal stenosis right greater than left. Upper chest: Emphysema Other: Endotracheal tube in good position.  NG tube in the esophagus IMPRESSION: No acute intracranial abnormality Negative for cervical spine fracture. Cervical scoliosis and spondylosis. Electronically Signed   By: Franchot Gallo M.D.   On: 03/20/2017 18:22   Ct Cervical Spine Wo Contrast  Result Date: 03/20/2017 CLINICAL DATA:  Found on floor.   Unresponsive. EXAM: CT HEAD WITHOUT CONTRAST CT CERVICAL SPINE WITHOUT CONTRAST TECHNIQUE: Multidetector CT imaging of the head and cervical spine was performed following the standard protocol without intravenous contrast. Multiplanar CT image reconstructions of the cervical spine were also generated. COMPARISON:  None. FINDINGS: CT HEAD FINDINGS Brain: No evidence of acute infarction, hemorrhage, hydrocephalus, extra-axial collection or mass lesion/mass effect. Vascular: Negative for hyperdense vessel Skull: Negative Sinuses/Orbits: Negative Other: The patient is intubated. CT CERVICAL SPINE FINDINGS Alignment: Scoliosis.  Normal alignment. Skull base and vertebrae: Negative for fracture Soft tissues and spinal canal: Negative for mass or adenopathy. Disc levels: Disc degeneration and disc bulging at C4-5. Disc degeneration and spurring at C5-6 and C6-7 causing spinal and foraminal stenosis right greater than left. Upper chest: Emphysema Other: Endotracheal tube in good position.  NG tube in the esophagus IMPRESSION: No acute intracranial abnormality Negative for cervical spine fracture. Cervical scoliosis and spondylosis. Electronically Signed   By: Franchot Gallo M.D.   On: 03/20/2017 18:22   Mr Jodene Nam Head Wo Contrast  Result Date: 03/21/2017 CLINICAL DATA:  Possible seizure. EXAM: MRI HEAD WITHOUT AND WITH CONTRAST MRA HEAD WITHOUT CONTRAST TECHNIQUE: Multiplanar, multiecho pulse sequences of the brain and surrounding structures were obtained without and with intravenous contrast. Angiographic images of the head were obtained using MRA technique without contrast. CONTRAST:  21mL MULTIHANCE GADOBENATE DIMEGLUMINE 529 MG/ML IV SOLN COMPARISON:  Head CT 03/20/2017 FINDINGS: MRI HEAD FINDINGS Brain: The midline structures are normal. No focal diffusion restriction to indicate acute infarct. No intraparenchymal hemorrhage. The brain parenchymal signal is normal for age. No mass lesion. No chronic microhemorrhage or  cerebral amyloid angiopathy. No hydrocephalus, age advanced atrophy or lobar predominant volume loss. No dural abnormality or extra-axial collection. Skull and upper cervical spine: The visualized skull base, calvarium, upper cervical spine and extracranial soft tissues are normal. Sinuses/Orbits: No fluid levels or advanced mucosal thickening. No mastoid effusion. Normal orbits. MRA HEAD FINDINGS Intracranial internal carotid arteries: Normal. Anterior cerebral arteries: Normal. Middle cerebral arteries: Normal. Posterior communicating arteries: Present on the right. Posterior cerebral arteries: Normal. Basilar artery: Normal. Vertebral arteries: Left dominant. Normal. Superior cerebellar arteries: Normal. Anterior inferior cerebellar arteries: Normal. Posterior inferior cerebellar arteries: Normal. IMPRESSION: Normal MRI/MRA of brain. Electronically Signed   By: Ulyses Jarred M.D.   On: 03/21/2017 04:12   Mr Jeri Cos KG Contrast  Result Date: 03/21/2017 CLINICAL DATA:  Possible seizure. EXAM: MRI HEAD WITHOUT AND WITH CONTRAST MRA HEAD WITHOUT CONTRAST TECHNIQUE: Multiplanar, multiecho pulse sequences of the brain and surrounding structures were obtained without and with intravenous contrast. Angiographic images of the head were obtained using MRA technique without contrast. CONTRAST:  102mL MULTIHANCE GADOBENATE DIMEGLUMINE 529 MG/ML IV  SOLN COMPARISON:  Head CT 03/20/2017 FINDINGS: MRI HEAD FINDINGS Brain: The midline structures are normal. No focal diffusion restriction to indicate acute infarct. No intraparenchymal hemorrhage. The brain parenchymal signal is normal for age. No mass lesion. No chronic microhemorrhage or cerebral amyloid angiopathy. No hydrocephalus, age advanced atrophy or lobar predominant volume loss. No dural abnormality or extra-axial collection. Skull and upper cervical spine: The visualized skull base, calvarium, upper cervical spine and extracranial soft tissues are normal.  Sinuses/Orbits: No fluid levels or advanced mucosal thickening. No mastoid effusion. Normal orbits. MRA HEAD FINDINGS Intracranial internal carotid arteries: Normal. Anterior cerebral arteries: Normal. Middle cerebral arteries: Normal. Posterior communicating arteries: Present on the right. Posterior cerebral arteries: Normal. Basilar artery: Normal. Vertebral arteries: Left dominant. Normal. Superior cerebellar arteries: Normal. Anterior inferior cerebellar arteries: Normal. Posterior inferior cerebellar arteries: Normal. IMPRESSION: Normal MRI/MRA of brain. Electronically Signed   By: Ulyses Jarred M.D.   On: 03/21/2017 04:12   Dg Chest Port 1 View  Result Date: 03/21/2017 CLINICAL DATA:  Respiratory failure EXAM: PORTABLE CHEST 1 VIEW COMPARISON:  Portable chest x-ray of March 20, 2017 FINDINGS: The lungs are adequately inflated. There is no focal infiltrate. There is no pleural effusion or pneumothorax. The heart and pulmonary vascularity are normal. The mediastinum is normal in width. There calcification in the wall of the thoracic aorta. The endotracheal tube tip projects approximately 2.8 cm above the carina. The esophagogastric tube tip and proximal port project below the GE junction. IMPRESSION: There is no acute cardiopulmonary abnormality. The support tubes are in reasonable position. Thoracic aortic atherosclerosis. Electronically Signed   By: David  Martinique M.D.   On: 03/21/2017 07:02   Dg Chest Port 1 View  Result Date: 03/20/2017 CLINICAL DATA:  72 year old female status post intubation. EXAM: PORTABLE CHEST 1 VIEW COMPARISON:  Chest radiographs 05/11/2012. FINDINGS: Portable AP supine view at 1435 hours. Endotracheal tube tip in good position just above the level the clavicles. Enteric tube courses to the abdomen, side hole up the level of the gastric fundus. Mildly low lung volumes. Allowing for portable technique the lungs are clear. Normal cardiac size and mediastinal contours. Negative  visible bowel gas pattern. No acute osseous abnormality identified. IMPRESSION: 1. ET tube and enteric tube appropriately placed. 2.  No acute cardiopulmonary abnormality. Electronically Signed   By: Genevie Ann M.D.   On: 03/20/2017 14:53    Microbiology: Recent Results (from the past 240 hour(s))  Urine culture     Status: Abnormal   Collection Time: 03/20/17  3:39 PM  Result Value Ref Range Status   Specimen Description URINE, RANDOM  Final   Special Requests Normal  Final   Culture MULTIPLE SPECIES PRESENT, SUGGEST RECOLLECTION (A)  Final   Report Status 03/21/2017 FINAL  Final  MRSA PCR Screening     Status: Abnormal   Collection Time: 03/20/17  6:45 PM  Result Value Ref Range Status   MRSA by PCR POSITIVE (A) NEGATIVE Final    Comment:        The GeneXpert MRSA Assay (FDA approved for NASAL specimens only), is one component of a comprehensive MRSA colonization surveillance program. It is not intended to diagnose MRSA infection nor to guide or monitor treatment for MRSA infections. RESULT CALLED TO, READ BACK BY AND VERIFIED WITH: KJONES,RN @2040  03/20/17 BY LHOWARD   Culture, blood (routine x 2)     Status: None   Collection Time: 03/20/17  7:08 PM  Result Value Ref Range Status  Specimen Description BLOOD BLOOD LEFT HAND  Final   Special Requests IN PEDIATRIC BOTTLE Blood Culture adequate volume  Final   Culture NO GROWTH 5 DAYS  Final   Report Status 03/25/2017 FINAL  Final  Culture, blood (routine x 2)     Status: None   Collection Time: 03/20/17  7:20 PM  Result Value Ref Range Status   Specimen Description BLOOD BLOOD RIGHT HAND  Final   Special Requests IN PEDIATRIC BOTTLE Blood Culture adequate volume  Final   Culture NO GROWTH 5 DAYS  Final   Report Status 03/25/2017 FINAL  Final  Culture, respiratory (tracheal aspirate)     Status: None   Collection Time: 03/21/17  7:36 AM  Result Value Ref Range Status   Specimen Description TRACHEAL ASPIRATE  Final    Special Requests Normal  Final   Gram Stain   Final    RARE WBC PRESENT, PREDOMINANTLY PMN MODERATE GRAM POSITIVE COCCI IN PAIRS RARE GRAM NEGATIVE RODS    Culture Consistent with normal respiratory flora.  Final   Report Status 03/23/2017 FINAL  Final  Culture, Urine     Status: Abnormal   Collection Time: 03/21/17  7:36 AM  Result Value Ref Range Status   Specimen Description URINE, CATHETERIZED  Final   Special Requests Normal  Final   Culture MULTIPLE SPECIES PRESENT, SUGGEST RECOLLECTION (A)  Final   Report Status 03/22/2017 FINAL  Final  Culture, Urine     Status: Abnormal   Collection Time: 03/22/17  6:42 PM  Result Value Ref Range Status   Specimen Description URINE, CATHETERIZED  Final   Special Requests NONE  Final   Culture MULTIPLE SPECIES PRESENT, SUGGEST RECOLLECTION (A)  Final   Report Status 03/24/2017 FINAL  Final     Labs: Basic Metabolic Panel: Recent Labs  Lab 03/20/17 1532  03/20/17 1815 03/21/17 0500 03/22/17 0722 03/23/17 0307 03/25/17 0244  NA 137   < > 135 136 140 140 138  K 4.6   < > 3.9 4.2 4.0 3.0* 3.4*  CL 104   < > 103 105 112* 109 109  CO2 19*  --  19* 18* 17* 19* 18*  GLUCOSE 106*   < > 104* 90 96 97 125*  BUN 18   < > 19 25* 24* 6 9  CREATININE 1.04*   < > 0.88 1.22* 0.77 0.72 0.69  CALCIUM 9.1  --  8.6* 8.8* 8.4* 8.5* 8.8*  MG 2.2  --  2.1 2.1  --   --   --   PHOS  --   --  3.1 3.4  --   --   --    < > = values in this interval not displayed.   Liver Function Tests: Recent Labs  Lab 03/20/17 1532 03/20/17 1815  AST 43* 41  ALT 18 18  ALKPHOS 61 56  BILITOT 0.9 0.6  PROT 7.2 6.7  ALBUMIN 3.5 3.2*   Recent Labs  Lab 03/20/17 1815  LIPASE 42  AMYLASE 108*   Recent Labs  Lab 03/21/17 0919  AMMONIA 10   CBC: Recent Labs  Lab 03/20/17 1532  03/20/17 1815 03/21/17 0500 03/22/17 0722 03/23/17 0307 03/25/17 0244  WBC 10.9*  --  11.2* 12.3* 10.2 10.4 7.1  NEUTROABS 9.5*  --   --  10.2*  --   --   --   HGB 14.6   <  > 13.7 12.8 11.1* 10.5* 10.8*  HCT 41.3   < >  39.2 36.6 33.8* 30.5* 31.3*  MCV 101.5*  --  101.3* 101.4* 106.0* 101.7* 100.6*  PLT 282  --  279 265 209 250 283   < > = values in this interval not displayed.   Cardiac Enzymes: Recent Labs  Lab 03/20/17 1532 03/20/17 1815 03/20/17 2214 03/21/17 0500 03/22/17 0722 03/25/17 0244  CKTOTAL 1,528*  --   --  1,692* 908* 348*  TROPONINI <0.03 <0.03 <0.03 <0.03  --   --    BNP: BNP (last 3 results) No results for input(s): BNP in the last 8760 hours.  ProBNP (last 3 results) No results for input(s): PROBNP in the last 8760 hours.  CBG: Recent Labs  Lab 03/22/17 1925 03/22/17 2325 03/23/17 0357 03/23/17 1203 03/25/17 0829  GLUCAP 88 99 94 100* 105*       Signed:  Kayleen Memos, Heather Horton [facility] 03/25/2017, 1:46 PM

## 2017-03-25 NOTE — Progress Notes (Signed)
Discharge instructions given to both the pt as well as her dgtr at bedside. All questions were answered & education was verified via teach back method. All belongings were gathered & sent w/ pt. Hoover Brunette

## 2017-03-28 ENCOUNTER — Telehealth: Payer: Self-pay

## 2017-03-28 NOTE — Telephone Encounter (Signed)
Transition Care Management Follow-up Telephone Call   Date discharged?03/25/17   How have you been since you were released from the hospital? Patient is doing better but still not herself.   Do you understand why you were in the hospital? yes   Do you understand the discharge instructions? yes   Where were you discharged to? home   Items Reviewed:  Medications reviewed: yes  Allergies reviewed: yes  Dietary changes reviewed: yes  Referrals reviewed: yes   Functional Questionnaire:   Activities of Daily Living (ADLs):   She states they are independent in the following: ambulation, feeding, continence, grooming, toileting and dressing States they require assistance with the following: bathing and hygiene   Any transportation issues/concerns?: no   Any patient concerns? no   Confirmed importance and date/time of follow-up visits scheduled yes  Provider Appointment booked with [name] on 04/08/17 @ 3:40 pm for physical but wants to have TOC visit completed at the same time.   Confirmed with patient if condition begins to worsen call PCP or go to the ER.  Patient was given the office number and encouraged to call back with question or concerns.  : yes

## 2017-03-29 NOTE — Telephone Encounter (Signed)
I recommend that we do the transition of care visit on 2/08, and reschedule the Annual Physical Exam.

## 2017-03-30 NOTE — Telephone Encounter (Signed)
Can you call this patient as soon as you can to get her schedule. Thank you

## 2017-04-01 ENCOUNTER — Telehealth: Payer: Self-pay | Admitting: Physician Assistant

## 2017-04-01 NOTE — Telephone Encounter (Signed)
Talked to pt to let her know that we could not do a CPE and a TOC at the same visit. We have moved forward with the TOC vist per Chelle.  Pt wants to reschedule her CPE and AWV for a different day when she comes in for her TOC visit.   I have put in a Skype message to Elmyra Ricks to see if she can assist with making the appts.

## 2017-04-01 NOTE — Telephone Encounter (Signed)
Called pt to let her know that we could not do a TOC and a CPE at the same visit. Per your request, we moved forward with a TOC 04/08/17. She will reschedule her CPE and AWV for a different time. I have reached out to [place] that schedules the AWV visits to see if she can assist with this pt.  Thanks!

## 2017-04-08 ENCOUNTER — Encounter: Payer: Self-pay | Admitting: Physician Assistant

## 2017-04-08 ENCOUNTER — Other Ambulatory Visit: Payer: Self-pay

## 2017-04-08 ENCOUNTER — Ambulatory Visit: Payer: Medicare Other

## 2017-04-08 ENCOUNTER — Ambulatory Visit (INDEPENDENT_AMBULATORY_CARE_PROVIDER_SITE_OTHER): Payer: Medicare HMO | Admitting: Physician Assistant

## 2017-04-08 VITALS — BP 124/70 | HR 102 | Temp 98.5°F | Resp 20 | Ht 66.0 in | Wt 183.8 lb

## 2017-04-08 DIAGNOSIS — I10 Essential (primary) hypertension: Secondary | ICD-10-CM

## 2017-04-08 DIAGNOSIS — G4700 Insomnia, unspecified: Secondary | ICD-10-CM | POA: Diagnosis not present

## 2017-04-08 DIAGNOSIS — R05 Cough: Secondary | ICD-10-CM

## 2017-04-08 DIAGNOSIS — J9601 Acute respiratory failure with hypoxia: Secondary | ICD-10-CM | POA: Diagnosis not present

## 2017-04-08 DIAGNOSIS — K7400 Hepatic fibrosis, unspecified: Secondary | ICD-10-CM

## 2017-04-08 DIAGNOSIS — Z7189 Other specified counseling: Secondary | ICD-10-CM | POA: Diagnosis not present

## 2017-04-08 DIAGNOSIS — Z7689 Persons encountering health services in other specified circumstances: Secondary | ICD-10-CM

## 2017-04-08 DIAGNOSIS — F411 Generalized anxiety disorder: Secondary | ICD-10-CM | POA: Diagnosis not present

## 2017-04-08 DIAGNOSIS — B182 Chronic viral hepatitis C: Secondary | ICD-10-CM | POA: Diagnosis not present

## 2017-04-08 DIAGNOSIS — R059 Cough, unspecified: Secondary | ICD-10-CM

## 2017-04-08 DIAGNOSIS — G9340 Encephalopathy, unspecified: Secondary | ICD-10-CM | POA: Diagnosis not present

## 2017-04-08 DIAGNOSIS — Z23 Encounter for immunization: Secondary | ICD-10-CM | POA: Diagnosis not present

## 2017-04-08 DIAGNOSIS — M159 Polyosteoarthritis, unspecified: Secondary | ICD-10-CM | POA: Diagnosis not present

## 2017-04-08 DIAGNOSIS — R69 Illness, unspecified: Secondary | ICD-10-CM | POA: Diagnosis not present

## 2017-04-08 MED ORDER — ALBUTEROL SULFATE HFA 108 (90 BASE) MCG/ACT IN AERS: 2.0000 | INHALATION_SPRAY | Freq: Four times a day (QID) | RESPIRATORY_TRACT | 1 refills | Status: AC | PRN

## 2017-04-08 MED ORDER — QUETIAPINE FUMARATE 25 MG PO TABS: 25.0000 mg | ORAL_TABLET | Freq: Every day | ORAL | 0 refills | Status: DC

## 2017-04-08 MED ORDER — TRAMADOL HCL 50 MG PO TABS
50.0000 mg | ORAL_TABLET | Freq: Four times a day (QID) | ORAL | 0 refills | Status: DC | PRN
Start: 2017-04-08 — End: 2017-04-29

## 2017-04-08 NOTE — Progress Notes (Signed)
TRANSITION OF CARE VISIT   Primary Care Physician: [name], PA-C   Date of Admission: 03/20/2017 Date of Discharge: 03/25/2017  Discharged from: [facility]  Discharge Diagnosis:  Acute encephalopathy Respiratory failure  Summary of Admission:  This patient presented to the emergency department with altered mental status and questionable seizure activity, suspected due to accidental opioid overdose. MRI/MRA brain revealed no CVA. EEG revealed no epileptic form. Hospital course was unremarkable. Acute renal failure, hypokalemia and mild rhabdomyolysis due to hypovolemia resolved with IV hydration. Keppra started. Seroquel was started for insomnia. Omeprazole was changed to pantoprazole. Tramadol, zanaflex and trazodone were held/discontinued. She was advised to follow-up with primary care and seek specialty pain management, and to avoid opiates in the interim.   TODAY's VISIT  Patient/Caregiver self-reported problems/concerns: she is accompanied by her daughter  -"I'm very very tired. I tire so easily."  Daughter is helping keep the house cleaned.  -"I'm eating Aleve like candy. My back." Dr. Charlestine Night has retired. He advised her that I can manage her OA going forward. Took 2 tablets three times already today, without any benefit.  -Seroquel isn't helping her sleep. Very fatigued. And jittery. Taking cat naps during the day. Recalls that trazodone 200 mg at HS helped some, but she still had early morning awakening. Trazodone was stopped during the hospitalization. She worries about everything.  I first became aware of her anxiety symptoms when her daughter brought it up at her last visit with me (10/28/2015). It is a long-time problem, but had been getting worse since her move from [place]. She isn't able to drive, gets anxious riding in cars, has withdrawn from friends. We started trazodone at that time, and was to  follow-up, but never did.  -"My voice is hoarse. My throat is feeling better." She relates this to the intubation.  -Is using nicotine patches. Reports that they are expensive, so she plans to change to the [facility] store brand for cost savings.  -Has had no alcohol ("I like my brandy") since discharge. Has been drinking wine instead since her birthday. A couple of glasses every night. Since discharge, has had only one glass of wine.   Patient Active Problem List   Diagnosis Date Noted  . Acute encephalopathy   . Respiratory failure ([place]) 03/20/2017  . Generalized anxiety disorder 07/22/2015  . Liver fibrosis 04/17/2015  . Family history of colon cancer 12/17/2014  . History of colonic polyps 12/17/2014  . Fibromyalgia 11/28/2014  . Need for pneumococcal vaccination 11/28/2014  . Rheumatoid arthritis ([place]) 10/17/2014  . Esophageal reflux 10/17/2014  . Benign essential HTN 10/17/2014  . Smoker 10/17/2014  . Need for hepatitis A vaccination 10/17/2014  . Chronic constipation   . Fibrocystic breast disease    Allergies  Allergen Reactions  . Influenza Vaccines Other (See Comments)  . Molds & Smuts Other (See Comments)    Per allergy test  . Pollen Extract Other (See Comments)    Per allergy test  . Aspirin Nausea And Vomiting and Rash     Past Medical History:  Diagnosis Date  . Allergy   . Arthritis   . Bronchitis   . Bursitis   .  Cervical cancer ([place])   . Chronic constipation   . Colon polyps   . Fibrocystic breast disease   . Galactorrhea on right side   . Hepatitis C   . Hyperlipidemia   . Hypertension   . Insomnia    Past Surgical History:  Procedure Laterality Date  . ABDOMINAL HYSTERECTOMY  30's   due to cervical cancer  . APPENDECTOMY      MEDICATIONS Prior to Admission medications   Medication Sig Start Date End Date Taking? Authorizing Provider  albuterol (PROVENTIL HFA;VENTOLIN HFA) 108 (90 Base) MCG/ACT inhaler Inhale 2 puffs into the lungs every 6  (six) hours as needed for wheezing. 04/29/15  Yes Aldahir Litaker, PA-C  amLODipine (NORVASC) 10 MG tablet Take 1 tablet (10 mg total) by mouth daily. 03/26/17  Yes Hall, Carole N, DO  levETIRAcetam (KEPPRA) 500 MG tablet Take 1 tablet (500 mg total) by mouth 2 (two) times daily. 03/25/17  Yes Hall, Carole N, DO  losartan-hydrochlorothiazide (HYZAAR) 100-25 MG tablet Take 1 tablet by mouth daily. 03/11/17  Yes Narek Kniss, PA-C  naproxen sodium (ALEVE) 220 MG tablet Take 220 mg by mouth as needed (pain).   Yes [provider]  nicotine (NICODERM CQ - DOSED IN MG/24 HOURS) 21 mg/24hr patch Place 1 patch (21 mg total) onto the skin daily. 03/26/17  Yes Irene Pap N, DO  pantoprazole (PROTONIX) 40 MG tablet Take 1 tablet (40 mg total) by mouth daily at 10 pm. 03/25/17  Yes Kayleen Memos, DO  QUEtiapine (SEROQUEL) 25 MG tablet Take 1 tablet (25 mg total) by mouth at bedtime. 03/25/17  Yes Kayleen Memos, DO     Medication Reconciliation conducted with patient/caregiver?  Yes  New medications prescribed/discontinued upon discharge?  Yes  Barriers identified related to medications:  The patient is frustrated that some medications are not appropriate for her, due to recent admission for mental status changes that are thought to be due to medication overdose.  LABS  Lab Reviewed: Yes  PHYSICAL EXAM:    Wt Readings from Last 3 Encounters:  04/08/17 183 lb 12.8 oz (83.4 kg)  03/25/17 196 lb 13.9 oz (89.3 kg)  03/21/17 200 lb (90.7 kg)  11/10/2015 182   Physical Exam  Constitutional: She is oriented to person, place, and time. She appears well-developed and well-nourished. She is active and cooperative. No distress.  BP 124/70 (BP Location: Right Arm, Patient Position: Sitting, Cuff Size: Normal)   Pulse (!) 102   Temp 98.5 F (36.9 C) (Oral)   Resp 20   Ht 5\' 6"  (1.676 m)   Wt 183 lb 12.8 oz (83.4 kg)   SpO2 98%   BMI 29.67 kg/m   HENT:  Head: Normocephalic and  atraumatic.  Right Ear: Hearing normal.  Left Ear: Hearing normal.  Eyes: Conjunctivae are normal. No scleral icterus.  Neck: Normal range of motion. Neck supple. No thyromegaly present.  Cardiovascular: Normal rate, regular rhythm and normal heart sounds.  Pulses:      Radial pulses are 2+ on the right side, and 2+ on the left side.  Pulmonary/Chest: Effort normal and breath sounds normal.  Lymphadenopathy:       Head (right side): No tonsillar, no preauricular, no posterior auricular and no occipital adenopathy present.       Head (left side): No tonsillar, no preauricular, no posterior auricular and no occipital adenopathy present.    She has no cervical adenopathy.       Right: No supraclavicular  adenopathy present.       Left: No supraclavicular adenopathy present.  Neurological: She is alert and oriented to person, place, and time. No sensory deficit.  Skin: Skin is warm, dry and intact. No rash noted. No cyanosis or erythema. Nails show no clubbing.  Psychiatric: She has a normal mood and affect. Her speech is normal and behavior is normal.     ASSESSMENT: Problem List Items Addressed This Visit    Osteoarthritis of multiple joints    STOP Aleve. She is overusing it. Trial of Tramadol. Counseled that she must not use it more than prescribed. She indicates that it will not be helpful.      Relevant Medications   traMADol (ULTRAM) 50 MG tablet   Other Relevant Orders   Ambulatory referral to Pain Clinic   Benign essential HTN    COntrolled. Continue amlodipine and losartan-HCTZ.      Relevant Orders   Comprehensive metabolic panel (Completed)   Liver fibrosis    Reviewed records. No follow-up recommended.      Relevant Orders   Comprehensive metabolic panel (Completed)   Generalized anxiety disorder    Discussed treatment options. Sounds like the anxiety is keeping her awake. While quetiapine can cause sedation at the lower doses, it clearly isn't adequate to address  her mood. INCREASE dose to 50 mg.      Relevant Medications   QUEtiapine (SEROQUEL) 25 MG tablet   Acute encephalopathy    Resolved. Follow-up with Neurology regarding need for continued Keppra for seizure prophylaxis.      Relevant Orders   Ambulatory referral to Neurology   RESOLVED: Chronic hepatitis C without hepatic coma ([place])   RESOLVED: Respiratory failure ([place])    Resolved.      Relevant Orders   Ambulatory referral to Neurology    Other Visit Diagnoses    Encounter for support and coordination of transition of care    -  Primary   Medicaitons reconciled. Reviewed need for follow-up with neurology.   Need for influenza vaccination       Relevant Orders   Flu Vaccine QUAD 36+ mos IM (Completed)   Insomnia, unspecified type       due to anxiety. INCREASE quetiapine dose.   Relevant Medications   QUEtiapine (SEROQUEL) 25 MG tablet       Return in about 1 week (around 04/15/2017) for Annual Exam.   Fara Chute, PA-C [facility] [place]: See AVS   FOLLOW-UP:  Return in about 1 week (around 04/15/2017) for Annual Exam. Additional recommended/upcoming evaluations:  Referral to neurology.

## 2017-04-08 NOTE — Patient Instructions (Addendum)
1. INCREASE the Seroquel (quetiapine) from 25 mg to 50 mg at bedtime. Use up what you have of the 25 mg by taking 2 tablets together each night. The new prescription is for 50 mg, so you'll take only one at bedtime.  2. STOP the Aleve. RESUME the tramadol 50 mg 4 times each day, AS NEEDED for pain. DO NOT take more than 1 at a time, and not more than 4 times a day.  My primary supervising physician is Dr. Delman Cheadle. The Medical Director at this office is Dr. Kathlyn Sacramento.   We recommend that you schedule a mammogram for breast cancer screening. Typically, you do not need a referral to do this. Please contact a local imaging center to schedule your mammogram.  [facility] - [phone]  *ask for the Radiology Department [facility] - [phone] or [phone]  [facility] - [phone] [place] [phone] [facility] - [phone]  *ask for the [facility] - [phone]  *ask for the Radiology Department [facility] - [phone]  *ask for the [facility] - [phone]    IF you received an x-ray today, you will receive an invoice from [facility] Radiology. Please contact [facility] Radiology at [phone] with questions or concerns regarding your invoice.   IF you received labwork today, you will receive an invoice from [facility]. Please contact [facility] at [phone] with questions or concerns regarding your invoice.   Our billing staff will not be able to assist you with questions regarding bills from these companies.  You will be contacted with the lab results as soon as they are available. The fastest way to get your results is to activate your My Chart account. Instructions are located on the last page of this paperwork. If you have not heard from [place] regarding the results in 2 weeks, please contact this office.

## 2017-04-08 NOTE — Progress Notes (Deleted)
     Patient ID: Heather Horton, female    DOB: 02/16/1946, 72 y.o.   MRN: [id number]  PCP: [name], PA-C  Chief Complaint  Patient presents with  . Transitions Of Care    Pt states she was hospitilized for seizures and respiratory failure failure    Subjective:   Presents for evaluation of ***.  ***.    Review of Systems     Patient Active Problem List   Diagnosis Date Noted  . Acute encephalopathy   . Respiratory failure ([place]) 03/20/2017  . Generalized anxiety disorder 07/22/2015  . Liver fibrosis 04/17/2015  . Family history of colon cancer 12/17/2014  . History of colonic polyps 12/17/2014  . Fibromyalgia 11/28/2014  . Need for pneumococcal vaccination 11/28/2014  . Rheumatoid arthritis ([place]) 10/17/2014  . Esophageal reflux 10/17/2014  . Chronic hepatitis C without hepatic coma ([place]) 10/17/2014  . Benign essential HTN 10/17/2014  . Smoker 10/17/2014  . Immune to hepatitis B 10/17/2014  . Need for hepatitis A vaccination 10/17/2014  . Chronic constipation   . Fibrocystic breast disease      Prior to Admission medications   Medication Sig Start Date End Date Taking? Authorizing Provider  albuterol (PROVENTIL HFA;VENTOLIN HFA) 108 (90 Base) MCG/ACT inhaler Inhale 2 puffs into the lungs every 6 (six) hours as needed for wheezing. 04/29/15  Yes Jeffery, Chelle, PA-C  amLODipine (NORVASC) 10 MG tablet Take 1 tablet (10 mg total) by mouth daily. 03/26/17  Yes Hall, Carole N, DO  levETIRAcetam (KEPPRA) 500 MG tablet Take 1 tablet (500 mg total) by mouth 2 (two) times daily. 03/25/17  Yes Hall, Carole N, DO  losartan-hydrochlorothiazide (HYZAAR) 100-25 MG tablet Take 1 tablet by mouth daily. 03/11/17  Yes Jeffery, Chelle, PA-C  naproxen sodium (ALEVE) 220 MG tablet Take 220 mg by mouth as needed (pain).   Yes [provider]  nicotine (NICODERM CQ - DOSED IN MG/24 HOURS) 21 mg/24hr patch Place 1 patch (21 mg total) onto the skin daily. 03/26/17  Yes Irene Pap N, DO  pantoprazole (PROTONIX) 40 MG tablet Take 1 tablet (40 mg total) by mouth daily at 10 pm. 03/25/17  Yes Kayleen Memos, DO  QUEtiapine (SEROQUEL) 25 MG tablet Take 1 tablet (25 mg total) by mouth at bedtime. 03/25/17  Yes Kayleen Memos, DO     Allergies  Allergen Reactions  . Influenza Vaccines Other (See Comments)  . Molds & Smuts Other (See Comments)    Per allergy test  . Pollen Extract Other (See Comments)    Per allergy test  . Aspirin Nausea And Vomiting and Rash       Objective:  Physical Exam         Assessment & Plan:   ***

## 2017-04-09 LAB — COMPREHENSIVE METABOLIC PANEL
A/G RATIO: 1.2 (ref 1.2–2.2)
ALK PHOS: 84 IU/L (ref 39–117)
ALT: 14 IU/L (ref 0–32)
AST: 20 IU/L (ref 0–40)
Albumin: 4.6 g/dL (ref 3.5–4.8)
BUN/Creatinine Ratio: 21 (ref 12–28)
BUN: 18 mg/dL (ref 8–27)
CHLORIDE: 101 mmol/L (ref 96–106)
CO2: 21 mmol/L (ref 20–29)
Calcium: 10 mg/dL (ref 8.7–10.3)
Creatinine, Ser: 0.86 mg/dL (ref 0.57–1.00)
GFR calc Af Amer: 79 mL/min/{1.73_m2} (ref >59)
GFR calc non Af Amer: 69 mL/min/{1.73_m2} (ref >59)
GLUCOSE: 84 mg/dL (ref 65–99)
Globulin, Total: 3.7 g/dL (ref 1.5–4.5)
POTASSIUM: 4.6 mmol/L (ref 3.5–5.2)
Sodium: 140 mmol/L (ref 134–144)
Total Protein: 8.3 g/dL (ref 6.0–8.5)

## 2017-04-10 ENCOUNTER — Encounter: Payer: Self-pay | Admitting: Physician Assistant

## 2017-04-10 NOTE — Assessment & Plan Note (Signed)
Resolved

## 2017-04-10 NOTE — Assessment & Plan Note (Signed)
STOP Aleve. She is overusing it. Trial of Tramadol. Counseled that she must not use it more than prescribed. She indicates that it will not be helpful.

## 2017-04-10 NOTE — Assessment & Plan Note (Signed)
Discussed treatment options. Sounds like the anxiety is keeping her awake. While quetiapine can cause sedation at the lower doses, it clearly isn't adequate to address her mood. INCREASE dose to 50 mg.

## 2017-04-10 NOTE — Assessment & Plan Note (Addendum)
Resolved. Follow-up with Neurology regarding need for continued Keppra for seizure prophylaxis.

## 2017-04-10 NOTE — Assessment & Plan Note (Signed)
COntrolled. Continue amlodipine and losartan-HCTZ.

## 2017-04-10 NOTE — Assessment & Plan Note (Signed)
Reviewed records. No follow-up recommended.

## 2017-04-11 ENCOUNTER — Telehealth: Payer: Self-pay | Admitting: Physician Assistant

## 2017-04-11 DIAGNOSIS — G4700 Insomnia, unspecified: Secondary | ICD-10-CM

## 2017-04-11 DIAGNOSIS — F411 Generalized anxiety disorder: Secondary | ICD-10-CM

## 2017-04-11 NOTE — Telephone Encounter (Signed)
Copied from [place]. Topic: Quick Communication - See Telephone Encounter >> Apr 11, 2017  8:32 AM Ahmed Prima L wrote: CRM for notification. See Telephone encounter for:   04/11/17.  Patient said that [facility] gave her a script for QUEtiapine (SEROQUEL) 25 MG tablet but she wanted her to take 50 mg so that would be two a day and she will not have enough to make it until her next appt in 2 weeks.  [id number] [place] [place] - [address]

## 2017-04-11 NOTE — Telephone Encounter (Signed)
Per appointment notes- 04/08/17- patient was to finish Rx 25 mg and then get new 50 mg dosing. Patient is calling for Rx  for 50 mg- she is getting ready to run out- she has 2 days of 25 mg left. Patient reports she is sleeping much better on the new dose.  Seroquel 50 mg  LOV: 04/08/17  Pharmacy: verified

## 2017-04-13 MED ORDER — QUETIAPINE FUMARATE 50 MG PO TABS: 50.0000 mg | ORAL_TABLET | Freq: Every day | ORAL | 2 refills | Status: DC

## 2017-04-13 NOTE — Telephone Encounter (Signed)
Rx sent electronically.  Meds ordered this encounter  Medications  . QUEtiapine (SEROQUEL) 50 MG tablet    Sig: Take 1 tablet (50 mg total) by mouth at bedtime.    Dispense:  30 tablet    Refill:  2    Order Specific Question:   Supervising Provider    Answer:   Brigitte Pulse, EVA N [[id number]]

## 2017-04-21 ENCOUNTER — Telehealth: Payer: Self-pay | Admitting: Physician Assistant

## 2017-04-21 DIAGNOSIS — I10 Essential (primary) hypertension: Secondary | ICD-10-CM

## 2017-04-21 MED ORDER — LOSARTAN POTASSIUM-HCTZ 100-25 MG PO TABS: 1.0000 | ORAL_TABLET | Freq: Every day | ORAL | 0 refills | Status: DC

## 2017-04-21 NOTE — Telephone Encounter (Signed)
Copied from [place]. Topic: Quick Communication - Rx Refill/Question >> Apr 21, 2017 11:07 AM Ether Griffins B wrote: Medication: losartan, levetiracetam and pantoprazole    Has the patient contacted their pharmacy? Yes.    Pt had an appt 04/22/17 but had to reschedule due to provider. She is rescheduled for 04/29/17 but will be out of her medication before that appt.   (Agent: If no, request that the patient contact the pharmacy for the refill.)   Preferred Pharmacy (with phone number or street name): [id number] [place], [place]: Please be advised that RX refills may take up to 3 business days. We ask that you follow-up with your pharmacy.

## 2017-04-21 NOTE — Telephone Encounter (Signed)
Levetiracetam and Pantoprazole not previously filled by Chelle Jeffrey,PA.  Last OV: 04/08/17

## 2017-04-22 ENCOUNTER — Encounter: Payer: Medicare HMO | Admitting: Physician Assistant

## 2017-04-26 MED ORDER — PANTOPRAZOLE SODIUM 40 MG PO TBEC: 40.0000 mg | DELAYED_RELEASE_TABLET | Freq: Every day | ORAL | 0 refills | Status: DC

## 2017-04-26 MED ORDER — LEVETIRACETAM 500 MG PO TABS: 500.0000 mg | ORAL_TABLET | Freq: Two times a day (BID) | ORAL | 0 refills | Status: DC

## 2017-04-26 MED ORDER — LOSARTAN POTASSIUM-HCTZ 100-25 MG PO TABS: 1.0000 | ORAL_TABLET | Freq: Every day | ORAL | 0 refills | Status: DC

## 2017-04-26 NOTE — Telephone Encounter (Signed)
YES!!  Meds ordered this encounter  Medications  . DISCONTD: losartan-hydrochlorothiazide (HYZAAR) 100-25 MG tablet    Sig: Take 1 tablet by mouth daily.    Dispense:  30 tablet    Refill:  0  . levETIRAcetam (KEPPRA) 500 MG tablet    Sig: Take 1 tablet (500 mg total) by mouth 2 (two) times daily.    Dispense:  60 tablet    Refill:  0    Order Specific Question:   Supervising Provider    Answer:   SHAW, EVA N [[id number]]  . losartan-hydrochlorothiazide (HYZAAR) 100-25 MG tablet    Sig: Take 1 tablet by mouth daily.    Dispense:  30 tablet    Refill:  0    Order Specific Question:   Supervising Provider    Answer:   SHAW, EVA N [[id number]]  . pantoprazole (PROTONIX) 40 MG tablet    Sig: Take 1 tablet (40 mg total) by mouth daily at 10 pm.    Dispense:  30 tablet    Refill:  0    Order Specific Question:   Supervising Provider    Answer:   Brigitte Pulse, EVA N [[id number]]

## 2017-04-26 NOTE — Telephone Encounter (Signed)
Chelle can we refill to get patient to her appointment.?  Not the original prescribe.  She has an appointment 3/1 but will be out of meds before then?

## 2017-04-29 ENCOUNTER — Encounter: Payer: Self-pay | Admitting: Physician Assistant

## 2017-04-29 ENCOUNTER — Ambulatory Visit (INDEPENDENT_AMBULATORY_CARE_PROVIDER_SITE_OTHER): Payer: Medicare HMO | Admitting: Physician Assistant

## 2017-04-29 VITALS — BP 160/78 | HR 102 | Temp 98.9°F | Resp 16 | Ht 66.0 in | Wt 186.4 lb

## 2017-04-29 DIAGNOSIS — E785 Hyperlipidemia, unspecified: Secondary | ICD-10-CM | POA: Diagnosis not present

## 2017-04-29 DIAGNOSIS — Z23 Encounter for immunization: Secondary | ICD-10-CM | POA: Diagnosis not present

## 2017-04-29 DIAGNOSIS — Z0000 Encounter for general adult medical examination without abnormal findings: Secondary | ICD-10-CM | POA: Diagnosis not present

## 2017-04-29 DIAGNOSIS — Z8541 Personal history of malignant neoplasm of cervix uteri: Secondary | ICD-10-CM | POA: Diagnosis not present

## 2017-04-29 DIAGNOSIS — I10 Essential (primary) hypertension: Secondary | ICD-10-CM

## 2017-04-29 DIAGNOSIS — M159 Polyosteoarthritis, unspecified: Secondary | ICD-10-CM | POA: Diagnosis not present

## 2017-04-29 DIAGNOSIS — S41109A Unspecified open wound of unspecified upper arm, initial encounter: Secondary | ICD-10-CM | POA: Diagnosis not present

## 2017-04-29 DIAGNOSIS — G4700 Insomnia, unspecified: Secondary | ICD-10-CM

## 2017-04-29 DIAGNOSIS — Z1231 Encounter for screening mammogram for malignant neoplasm of breast: Secondary | ICD-10-CM

## 2017-04-29 DIAGNOSIS — F17200 Nicotine dependence, unspecified, uncomplicated: Secondary | ICD-10-CM

## 2017-04-29 DIAGNOSIS — F411 Generalized anxiety disorder: Secondary | ICD-10-CM | POA: Diagnosis not present

## 2017-04-29 DIAGNOSIS — R69 Illness, unspecified: Secondary | ICD-10-CM | POA: Diagnosis not present

## 2017-04-29 DIAGNOSIS — K219 Gastro-esophageal reflux disease without esophagitis: Secondary | ICD-10-CM

## 2017-04-29 MED ORDER — TRAMADOL HCL 50 MG PO TABS: 50.0000 mg | ORAL_TABLET | Freq: Four times a day (QID) | ORAL | 0 refills | Status: DC | PRN

## 2017-04-29 MED ORDER — LOSARTAN POTASSIUM 100 MG PO TABS: 100.0000 mg | ORAL_TABLET | Freq: Every day | ORAL | 3 refills | Status: DC

## 2017-04-29 MED ORDER — AMLODIPINE BESYLATE 10 MG PO TABS: 10.0000 mg | ORAL_TABLET | Freq: Every day | ORAL | 0 refills | Status: DC

## 2017-04-29 MED ORDER — HYDROCHLOROTHIAZIDE 25 MG PO TABS: 25.0000 mg | ORAL_TABLET | Freq: Every day | ORAL | 3 refills | Status: DC

## 2017-04-29 MED ORDER — PANTOPRAZOLE SODIUM 40 MG PO TBEC: 40.0000 mg | DELAYED_RELEASE_TABLET | Freq: Every day | ORAL | 0 refills | Status: DC

## 2017-04-29 MED ORDER — QUETIAPINE FUMARATE 100 MG PO TABS: 100.0000 mg | ORAL_TABLET | Freq: Every day | ORAL | 3 refills | Status: DC

## 2017-04-29 NOTE — Progress Notes (Signed)
Patient ID: Heather Horton, female    DOB: Jan 10, 1946, 72 y.o.   MRN: [id number]  PCP: [name], PA-C  Chief Complaint  Patient presents with  . Annual Exam    Subjective:   Presents for [employer]. She is accompanied by her daughter.  Neurology follow-up is 4/30.  She continues Keppra.  No seizure activity since hospital discharge.  Pain clinic advised her to wait to see them until she has seen neurology.  She continues tramadol, as needed, for pain.  Continues to feel very tired.  Continues to struggle with insomnia. Increasing quetiapine from 25 mg to 50 mg has helped but only a little.  She relates that one night she took 100 mg and that helped much more.  She continues to experience an excessive amount of anxiety, specifically around riding in the car.  Today is rainy and it is getting late in the afternoon, the dark wet roads for the right, are causing more significant anxiety.  Trying to stop smoking. Finds herself a bit SOB. Using the rescue inhaler helps.  She has a 60-pack-year history.  She has begun picking at her skin, especially on the upper arms.  She has created scattered wounds.  Does not realize that she is scratching.  Cervical Cancer Screening: remote history of cervical cancer, s/p TAH Breast Cancer Screening: Due for mammography.  Does not perform self breast exams. Colorectal Cancer Screening: Multiple polyps on colonoscopy 01/2015.  Repeat 01/2018. Bone Density Testing: Patient reported 09/2008 HIV Screening: Very low risk, no longer a candidate for screening by age STI Screening: Very low risk, declines Seasonal Influenza Vaccination: Refuses Td/Tdap Vaccination: Current Pneumococcal Vaccination: Series complete Zoster Vaccination: Recommended    Review of Systems  Constitutional: Positive for fatigue. Negative for activity change, appetite change, chills, diaphoresis, fever and unexpected weight change.  HENT: Positive for dental  problem and rhinorrhea. Negative for congestion, drooling, ear discharge, ear pain, facial swelling, hearing loss, mouth sores, nosebleeds, postnasal drip, sinus pressure, sinus pain, sneezing, sore throat, tinnitus, trouble swallowing and voice change.   Eyes: Positive for redness and visual disturbance. Negative for photophobia, pain, discharge and itching.  Respiratory: Positive for cough and wheezing. Negative for apnea, choking, chest tightness, shortness of breath and stridor.   Cardiovascular: Negative.   Gastrointestinal: Positive for constipation. Negative for abdominal distention, abdominal pain, anal bleeding, blood in stool, diarrhea, nausea, rectal pain and vomiting.  Endocrine: Positive for polyphagia and polyuria. Negative for cold intolerance, heat intolerance and polydipsia.  Genitourinary: Negative.   Musculoskeletal: Positive for arthralgias, back pain, joint swelling, myalgias, neck pain and neck stiffness. Negative for gait problem.  Skin: Negative.   Allergic/Immunologic: Positive for environmental allergies. Negative for food allergies and immunocompromised state.  Neurological: Positive for dizziness, light-headedness and headaches. Negative for tremors, seizures, syncope, facial asymmetry, speech difficulty, weakness and numbness.  Hematological: Negative.   Psychiatric/Behavioral: Positive for agitation and sleep disturbance. Negative for behavioral problems, confusion, decreased concentration, dysphoric mood, hallucinations, self-injury and suicidal ideas. The patient is nervous/anxious. The patient is not hyperactive.     Patient Active Problem List   Diagnosis Date Noted  . Acute encephalopathy   . Generalized anxiety disorder 07/22/2015  . Liver fibrosis 04/17/2015  . Family history of colon cancer 12/17/2014  . History of colonic polyps 12/17/2014  . Fibromyalgia 11/28/2014  . Osteoarthritis of multiple joints 10/17/2014  . Esophageal reflux 10/17/2014  .  Benign essential HTN 10/17/2014  . Smoker 10/17/2014  .  Chronic constipation   . Fibrocystic breast disease     Past Medical History:  Diagnosis Date  . Allergy   . Arthritis   . Bronchitis   . Bursitis   . Cervical cancer ([place])   . Chronic constipation   . Chronic hepatitis C without hepatic coma ([place]) 10/17/2014   11/10/2015 visit with Dr. Linus Salmons. Repeat labs revealed resolution of infection following treatment.  [place], [place], FNP-C; last seen 01/2014 Genotype 1b Diagnosed 01/2013  . Colon polyps   . Fibrocystic breast disease   . Galactorrhea on right side   . Hepatitis C   . Hyperlipidemia   . Hypertension   . Insomnia   . Respiratory failure ([place]) 03/20/2017     Prior to Admission medications   Medication Sig Start Date End Date Taking? Authorizing Provider  albuterol (PROVENTIL HFA;VENTOLIN HFA) 108 (90 Base) MCG/ACT inhaler Inhale 2 puffs into the lungs every 6 (six) hours as needed for wheezing. 04/08/17   [name], PA-C  amLODipine (NORVASC) 10 MG tablet Take 1 tablet (10 mg total) by mouth daily. 03/26/17   Kayleen Memos, DO  levETIRAcetam (KEPPRA) 500 MG tablet Take 1 tablet (500 mg total) by mouth 2 (two) times daily. 04/26/17   [name], PA-C  losartan-hydrochlorothiazide (HYZAAR) 100-25 MG tablet Take 1 tablet by mouth daily. 04/26/17   [name], PA-C  naproxen sodium (ALEVE) 220 MG tablet Take 220 mg by mouth as needed (pain).    [provider]  nicotine (NICODERM CQ - DOSED IN MG/24 HOURS) 21 mg/24hr patch Place 1 patch (21 mg total) onto the skin daily. 03/26/17   Kayleen Memos, DO  pantoprazole (PROTONIX) 40 MG tablet Take 1 tablet (40 mg total) by mouth daily at 10 pm. 04/26/17   [name], PA-C  QUEtiapine (SEROQUEL) 50 MG tablet Take 1 tablet (50 mg total) by mouth at bedtime. 04/13/17   [name], PA-C  traMADol (ULTRAM) 50 MG tablet Take 1 tablet (50 mg total) by mouth every  6 (six) hours as needed for moderate pain. 04/08/17   [name], PA-C    Allergies  Allergen Reactions  . Molds & Smuts Other (See Comments)    Per allergy test  . Pollen Extract Other (See Comments)    Per allergy test  . Aspirin Nausea And Vomiting and Rash    Past Surgical History:  Procedure Laterality Date  . ABDOMINAL HYSTERECTOMY  30's   due to cervical cancer  . APPENDECTOMY      Family History  Problem Relation Age of Onset  . Asthma Mother   . Colon cancer Sister 65  . Stroke Brother 38  . Stroke Sister 30  . Arthritis Daughter   . Pulmonary embolism Daughter 6       on chronic warfarin  . Hypertension Son 15    Social History   Socioeconomic History  . Marital status: Single    Spouse name: n/a  . Number of children: 3  . Years of education: 14+  . Highest education level: None  Social Needs  . Financial resource strain: None  . Food insecurity - worry: None  . Food insecurity - inability: None  . Transportation needs - medical: None  . Transportation needs - non-medical: None  Occupational History  . Occupation: retired    Comment: phlebotomy, EKG tech; taught both  Tobacco Use  . Smoking status: Current Every Day Smoker    Packs/day:  1.00    Years: 40.00    Pack years: 40.00    Types: Cigarettes  . Smokeless tobacco: Never Used  . Tobacco comment: I'm scared of e-cigarettes, interested in patches, tobacco info given 01/10/15  Substance and Sexual Activity  . Alcohol use: Yes    Alcohol/week: 8.4 - 10.8 oz    Types: 14 - 18 Standard drinks or equivalent per week    Comment: I love my brandy, I'm gonna drink a couple of drinks each evening, it helps me sleep  . Drug use: No  . Sexual activity: Not Currently  Other Topics Concern  . None  Social History Narrative   Raised in [place], [place].   Moved to [place], [place] and then came to [place] in 2013.   Lives alone.   Daughter lives around the corner.    Raised her niece as her own  daughter, and she lives nearby.   Son lives in [place].        Objective:  Physical Exam  Constitutional: She is oriented to person, place, and time. Vital signs are normal. She appears well-developed and well-nourished. She is active and cooperative. No distress.  BP (!) 160/78   Pulse (!) 102   Temp 98.9 F (37.2 C)   Resp 16   Ht 5\' 6"  (1.676 m)   Wt 186 lb 6.4 oz (84.6 kg)   SpO2 98%   BMI 30.09 kg/m    HENT:  Head: Normocephalic and atraumatic.  Right Ear: Hearing, tympanic membrane, external ear and ear canal normal. No foreign bodies.  Left Ear: Hearing, tympanic membrane, external ear and ear canal normal. No foreign bodies.  Nose: Nose normal.  Mouth/Throat: Uvula is midline, oropharynx is clear and moist and mucous membranes are normal. No oral lesions. Normal dentition. No dental abscesses or uvula swelling. No oropharyngeal exudate.  Eyes: Conjunctivae, EOM and lids are normal. Pupils are equal, round, and reactive to light. Right eye exhibits no discharge. Left eye exhibits no discharge. No scleral icterus.  Fundoscopic exam:      The right eye shows no arteriolar narrowing, no AV nicking, no exudate, no hemorrhage and no papilledema. The right eye shows red reflex.       The left eye shows no arteriolar narrowing, no AV nicking, no exudate, no hemorrhage and no papilledema. The left eye shows red reflex.  Neck: Trachea normal, normal range of motion and full passive range of motion without pain. Neck supple. No spinous process tenderness and no muscular tenderness present. No thyroid mass and no thyromegaly present.  Cardiovascular: Normal rate, regular rhythm, normal heart sounds, intact distal pulses and normal pulses.  Pulmonary/Chest: Effort normal and breath sounds normal. Right breast exhibits no inverted nipple, no mass, no nipple discharge, no skin change and no tenderness. Left breast exhibits no inverted nipple, no mass, no nipple discharge, no skin change and  no tenderness. Breasts are symmetrical.  Musculoskeletal: She exhibits no edema or tenderness.       Cervical back: Normal.       Thoracic back: Normal.       Lumbar back: Normal.  Lymphadenopathy:       Head (right side): No tonsillar, no preauricular, no posterior auricular and no occipital adenopathy present.       Head (left side): No tonsillar, no preauricular, no posterior auricular and no occipital adenopathy present.    She has no cervical adenopathy.       Right: No supraclavicular adenopathy present.  Left: No supraclavicular adenopathy present.  Neurological: She is alert and oriented to person, place, and time. She has normal strength and normal reflexes. No cranial nerve deficit. She exhibits normal muscle tone. Coordination and gait normal.  Skin: Skin is warm, dry and intact. No rash noted. She is not diaphoretic. No cyanosis or erythema. Nails show no clubbing.  Psychiatric: She has a normal mood and affect. Her speech is normal and behavior is normal. Judgment and thought content normal.     Wt Readings from Last 3 Encounters:  04/29/17 186 lb 6.4 oz (84.6 kg)  04/08/17 183 lb 12.8 oz (83.4 kg)  03/25/17 196 lb 13.9 oz (89.3 kg)     Visual Acuity Screening   Right eye Left eye Both eyes  Without correction: 20/50 20/70 20/50   With correction:            Assessment & Plan:   Problem List Items Addressed This Visit    Osteoarthritis of multiple joints    Continue Aleve and tramadol as needed for pain.      Relevant Medications   traMADol (ULTRAM) 50 MG tablet   Esophageal reflux      Continue l lisinopril HCTZ controlled.  Continue pantoprazole.      Relevant Medications   pantoprazole (PROTONIX) 40 MG tablet   Benign essential HTN    Uncontrolled today.  Has run out of Hyzaar due to manufactures backorder.  Per the pharmacy recommendation, prescriptions for each element of the product prescribed separately.      Relevant Medications   losartan  (COZAAR) 100 MG tablet   hydrochlorothiazide (HYDRODIURIL) 25 MG tablet   amLODipine (NORVASC) 10 MG tablet   Smoker    Encouraged her efforts for smoking cessation.      Generalized anxiety disorder    INCREASE quetiapine from 50 mg to 100 mg at HS.      Relevant Medications   QUEtiapine (SEROQUEL) 100 MG tablet   History of cervical cancer    The patient believes that she no longer needs cervical cancer screening due to total abdominal hysterectomy.  We have none of her previous records.  I will need to investigate recommendations for screening with remote cervical cancer history status post hysterectomy.       Other Visit Diagnoses       Annual physical exam    -  Primary   Age-appropriate health guidance provided.   Insomnia, unspecified type       due to anxiety. INCREASE quetiapine dose.   Relevant Medications   QUEtiapine (SEROQUEL) 100 MG tablet   Need for Td vaccine       Relevant Orders   Td vaccine greater than or equal to 7yo preservative free IM (Completed)   Wound of upper extremity, unspecified laterality, initial encounter       picks at her skin. update Td.   Relevant Orders   Td vaccine greater than or equal to 7yo preservative free IM (Completed)   Encounter for screening mammogram for breast cancer       Relevant Orders   MM DIGITAL SCREENING BILATERAL     Addendum: Review of ASCCP and Up-to-Date indicates that she should have annual vaginal Pap testing indefinitely.  Return in about 4 weeks (around 05/27/2017) for re-evaluation of anxiety and sleep.   Fara Chute, PA-C [facility]

## 2017-04-29 NOTE — Assessment & Plan Note (Signed)
INCREASE quetiapine from 50 mg to 100 mg at HS.

## 2017-04-29 NOTE — Patient Instructions (Addendum)
INCREASE the quetiapine from 50 mg to 100 mg. We are changing the losartan-HCTZ to the individual parts: losartan and HCTZ. Keep working on quitting smoking!    IF you received an x-ray today, you will receive an invoice from [facility]. Please contact [facility] at [phone] with questions or concerns regarding your invoice.   IF you received labwork today, you will receive an invoice from [facility]. Please contact [facility] at [phone] with questions or concerns regarding your invoice.   Our billing staff will not be able to assist you with questions regarding bills from these companies.  You will be contacted with the lab results as soon as they are available. The fastest way to get your results is to activate your My Chart account. Instructions are located on the last page of this paperwork. If you have not heard from [place] regarding the results in 2 weeks, please contact this office.      Preventive Care 2 Years and Older, Female Preventive care refers to lifestyle choices and visits with your health care provider that can promote health and wellness. What does preventive care include?  A yearly physical exam. This is also called an annual well check.  Dental exams once or twice a year.  Routine eye exams. Ask your health care provider how often you should have your eyes checked.  Personal lifestyle choices, including: ? Daily care of your teeth and gums. ? Regular physical activity. ? Eating a healthy diet. ? Avoiding tobacco and drug use. ? Limiting alcohol use. ? Practicing safe sex. ? Taking low-dose aspirin every day. ? Taking vitamin and mineral supplements as recommended by your health care provider. What happens during an annual well check? The services and screenings done by your health care provider during your annual well check will depend on your age, overall health, lifestyle risk factors, and family history of disease. Counseling Your  health care provider may ask you questions about your:  Alcohol use.  Tobacco use.  Drug use.  Emotional well-being.  Home and relationship well-being.  Sexual activity.  Eating habits.  History of falls.  Memory and ability to understand (cognition).  Work and work Statistician.  Reproductive health.  Screening You may have the following tests or measurements:  Height, weight, and BMI.  Blood pressure.  Lipid and cholesterol levels. These may be checked every 5 years, or more frequently if you are over 56 years old.  Skin check.  Lung cancer screening. You may have this screening every year starting at age 35 if you have a 30-pack-year history of smoking and currently smoke or have quit within the past 15 years.  Fecal occult blood test (FOBT) of the stool. You may have this test every year starting at age 25.  Flexible sigmoidoscopy or colonoscopy. You may have a sigmoidoscopy every 5 years or a colonoscopy every 10 years starting at age 23.  Hepatitis C blood test.  Hepatitis B blood test.  Sexually transmitted disease (STD) testing.  Diabetes screening. This is done by checking your blood sugar (glucose) after you have not eaten for a while (fasting). You may have this done every 1-3 years.  Bone density scan. This is done to screen for osteoporosis. You may have this done starting at age 62.  Mammogram. This may be done every 1-2 years. Talk to your health care provider about how often you should have regular mammograms.  Talk with your health care provider about your test results, treatment options, and if  necessary, the need for more tests. Vaccines Your health care provider may recommend certain vaccines, such as:  Influenza vaccine. This is recommended every year.  Tetanus, diphtheria, and acellular pertussis (Tdap, Td) vaccine. You may need a Td booster every 10 years.  Varicella vaccine. You may need this if you have not been  vaccinated.  Zoster vaccine. You may need this after age 20.  Measles, mumps, and rubella (MMR) vaccine. You may need at least one dose of MMR if you were born in 1957 or later. You may also need a second dose.  Pneumococcal 13-valent conjugate (PCV13) vaccine. One dose is recommended after age 29.  Pneumococcal polysaccharide (PPSV23) vaccine. One dose is recommended after age 29.  Meningococcal vaccine. You may need this if you have certain conditions.  Hepatitis A vaccine. You may need this if you have certain conditions or if you travel or work in places where you may be exposed to hepatitis A.  Hepatitis B vaccine. You may need this if you have certain conditions or if you travel or work in places where you may be exposed to hepatitis B.  Haemophilus influenzae type b (Hib) vaccine. You may need this if you have certain conditions.  Talk to your health care provider about which screenings and vaccines you need and how often you need them. This information is not intended to replace advice given to you by your health care provider. Make sure you discuss any questions you have with your health care provider. Document Released: 03/14/2015 Document Revised: 11/05/2015 Document Reviewed: 12/17/2014 Elsevier Interactive Patient Education  [employer].

## 2017-04-30 LAB — LIPID PANEL
CHOLESTEROL TOTAL: 244 mg/dL — AB (ref 100–199)
Chol/HDL Ratio: 3.2 ratio (ref 0.0–4.4)
HDL: 77 mg/dL (ref >39)
LDL Calculated: 146 mg/dL — ABNORMAL HIGH (ref 0–99)
TRIGLYCERIDES: 105 mg/dL (ref 0–149)
VLDL CHOLESTEROL CAL: 21 mg/dL (ref 5–40)

## 2017-04-30 MED ORDER — ATORVASTATIN CALCIUM 10 MG PO TABS: 10.0000 mg | ORAL_TABLET | Freq: Every day | ORAL | 3 refills | Status: DC

## 2017-04-30 NOTE — Assessment & Plan Note (Signed)
Continue Aleve and tramadol as needed for pain.

## 2017-04-30 NOTE — Assessment & Plan Note (Signed)
The patient believes that she no longer needs cervical cancer screening due to total abdominal hysterectomy.  We have none of her previous records.  I will need to investigate recommendations for screening with remote cervical cancer history status post hysterectomy.

## 2017-04-30 NOTE — Assessment & Plan Note (Signed)
Encouraged her efforts for smoking cessation.

## 2017-04-30 NOTE — Assessment & Plan Note (Signed)
Continue l lisinopril HCTZ controlled.  Continue pantoprazole.

## 2017-04-30 NOTE — Assessment & Plan Note (Signed)
Uncontrolled today.  Has run out of Hyzaar due to manufactures backorder.  Per the pharmacy recommendation, prescriptions for each element of the product prescribed separately.

## 2017-05-07 ENCOUNTER — Other Ambulatory Visit: Payer: Self-pay | Admitting: Physician Assistant

## 2017-05-09 NOTE — Telephone Encounter (Signed)
Keppra refill Last OV: 04/29/17 Last Refill:04/26/17 Pharmacy:[facility] PCP: Daphane Shepherd PA Has appt with neurology 06/28/17

## 2017-05-27 ENCOUNTER — Other Ambulatory Visit: Payer: Self-pay | Admitting: Physician Assistant

## 2017-05-27 DIAGNOSIS — I10 Essential (primary) hypertension: Secondary | ICD-10-CM

## 2017-06-03 ENCOUNTER — Encounter: Payer: Self-pay | Admitting: Physician Assistant

## 2017-06-03 ENCOUNTER — Ambulatory Visit (INDEPENDENT_AMBULATORY_CARE_PROVIDER_SITE_OTHER): Payer: Medicare HMO | Admitting: Physician Assistant

## 2017-06-03 DIAGNOSIS — G4700 Insomnia, unspecified: Secondary | ICD-10-CM

## 2017-06-03 DIAGNOSIS — F411 Generalized anxiety disorder: Secondary | ICD-10-CM

## 2017-06-03 DIAGNOSIS — R69 Illness, unspecified: Secondary | ICD-10-CM | POA: Diagnosis not present

## 2017-06-03 MED ORDER — QUETIAPINE FUMARATE 100 MG PO TABS: 150.0000 mg | ORAL_TABLET | Freq: Every day | ORAL | 3 refills | Status: DC

## 2017-06-03 NOTE — Patient Instructions (Addendum)
INCREASE quetiapine (Seroquel) from 100 mg to 150 mg, then to 200 mg.    IF you received an x-ray today, you will receive an invoice from [facility]. Please contact [facility] at [phone] with questions or concerns regarding your invoice.   IF you received labwork today, you will receive an invoice from [facility]. Please contact [facility] at [phone] with questions or concerns regarding your invoice.   Our billing staff will not be able to assist you with questions regarding bills from these companies.  You will be contacted with the lab results as soon as they are available. The fastest way to get your results is to activate your My Chart account. Instructions are located on the last page of this paperwork. If you have not heard from [place] regarding the results in 2 weeks, please contact this office.

## 2017-06-03 NOTE — Assessment & Plan Note (Signed)
INCREASE quetiapine to 150 mg, then 200 mg if needed. Consider addition of buspirone.

## 2017-06-03 NOTE — Progress Notes (Signed)
Patient ID: Heather Horton, female    DOB: [birth year], 72 y.o.   MRN: [id number]  PCP: [name], PA-C  Chief Complaint  Patient presents with  . anxiety and sleep    follow up    Subjective:   Presents for evaluation of anxiety and insomnia. She is accompanied by her daughter.  The rainy weather increases her stress, she wanted to cancel today's appointment so that her daughter would not have to drive in the rain. The anxiety she experiences makes it quite stressful for the driver, as well.  Increasing the questiapine from 50 mg to 100 mg has helped, but she still has difficulty falling asleep. Last night, "I took it at 10 pm, and at 12, I was staring at the walls."  No energy. Not interested in activities that used to make her happy. Less meticulous in her housekeeping. Continues to work on smoking cessation.  Sees neurology later this month, hopes to come off the [place].  Is due for mammogram. Is worried that she needs a diagnostic mammogram due to some asymmetry of the breasts.   Review of Systems As above.    Patient Active Problem List   Diagnosis Date Noted  . History of cervical cancer 04/29/2017  . Generalized anxiety disorder 07/22/2015  . Liver fibrosis 04/17/2015  . Family history of colon cancer 12/17/2014  . History of colonic polyps 12/17/2014  . Fibromyalgia 11/28/2014  . Osteoarthritis of multiple joints 10/17/2014  . Esophageal reflux 10/17/2014  . Benign essential HTN 10/17/2014  . Smoker 10/17/2014  . Chronic constipation   . Fibrocystic breast disease      Prior to Admission medications   Medication Sig Start Date End Date Taking? Authorizing Provider  albuterol (PROVENTIL HFA;VENTOLIN HFA) 108 (90 Base) MCG/ACT inhaler Inhale 2 puffs into the lungs every 6 (six) hours as needed for wheezing. 04/08/17  Yes Woodie Trusty, PA-C  amLODipine (NORVASC) 10 MG tablet TAKE 1 TABLET BY MOUTH EVERY DAY 05/27/17  Yes Eria Lozoya, PA-C    atorvastatin (LIPITOR) 10 MG tablet Take 1 tablet (10 mg total) by mouth daily. 04/30/17  Yes Carletta Feasel, PA-C  hydrochlorothiazide (HYDRODIURIL) 25 MG tablet Take 1 tablet (25 mg total) by mouth daily. 04/29/17  Yes Stephonie Wilcoxen, PA-C  levETIRAcetam (KEPPRA) 500 MG tablet TAKE 1 TABLET BY MOUTH TWICE A DAY 05/09/17  Yes Tiarna Koppen, PA-C  losartan (COZAAR) 100 MG tablet Take 1 tablet (100 mg total) by mouth daily. 04/29/17  Yes Jerrell Mangel, PA-C  naproxen sodium (ALEVE) 220 MG tablet Take 220 mg by mouth as needed (pain).   Yes [provider]  nicotine (NICODERM CQ - DOSED IN MG/24 HOURS) 21 mg/24hr patch Place 1 patch (21 mg total) onto the skin daily. 03/26/17  Yes Hall, Carole N, DO  pantoprazole (PROTONIX) 40 MG tablet Take 1 tablet (40 mg total) by mouth daily at 10 pm. 04/29/17  Yes Valory Wetherby, PA-C  QUEtiapine (SEROQUEL) 100 MG tablet Take 1 tablet (100 mg total) by mouth at bedtime. 04/29/17  Yes Lin Glazier, PA-C  traMADol (ULTRAM) 50 MG tablet Take 1 tablet (50 mg total) by mouth every 6 (six) hours as needed for moderate pain. 04/29/17  Yes [name], PA-C     Allergies  Allergen Reactions  . Molds & Smuts Other (See Comments)    Per allergy test  . Pollen Extract Other (See Comments)    Per allergy test  . Aspirin Nausea And Vomiting and Rash  Objective:  Physical Exam  Constitutional: She is oriented to person, place, and time. She appears well-developed and well-nourished. She is active and cooperative. No distress.  BP 126/70 (BP Location: Left Arm, Patient Position: Sitting, Cuff Size: Normal)   Pulse 97   Temp 98.5 F (36.9 C) (Oral)   Resp 17   Ht 5\' 6"  (1.676 m)   Wt 188 lb 9.6 oz (85.5 kg)   SpO2 95%   BMI 30.44 kg/m   HENT:  Head: Normocephalic and atraumatic.  Right Ear: Hearing normal.  Left Ear: Hearing normal.  Eyes: Conjunctivae are normal. No scleral icterus.  Neck: Normal range of motion. Neck supple. No  thyromegaly present.  Cardiovascular: Normal rate, regular rhythm and normal heart sounds.  Pulses:      Radial pulses are 2+ on the right side, and 2+ on the left side.  Pulmonary/Chest: Effort normal and breath sounds normal. Right breast exhibits no inverted nipple, no mass, no nipple discharge, no skin change and no tenderness. Left breast exhibits no inverted nipple, no mass, no nipple discharge, no skin change and no tenderness. Breasts are symmetrical (slight increase in fatty tissue in the anterior LEFT axilla compared to the RIGHT.).  Lymphadenopathy:       Head (right side): No tonsillar, no preauricular, no posterior auricular and no occipital adenopathy present.       Head (left side): No tonsillar, no preauricular, no posterior auricular and no occipital adenopathy present.    She has no cervical adenopathy.       Right: No supraclavicular adenopathy present.       Left: No supraclavicular adenopathy present.  Neurological: She is alert and oriented to person, place, and time. No sensory deficit.  Skin: Skin is warm, dry and intact. No rash noted. No cyanosis or erythema. Nails show no clubbing.  Psychiatric: She has a normal mood and affect. Her speech is normal and behavior is normal.    Wt Readings from Last 3 Encounters:  06/03/17 188 lb 9.6 oz (85.5 kg)  04/29/17 186 lb 6.4 oz (84.6 kg)  04/08/17 183 lb 12.8 oz (83.4 kg)       Assessment & Plan:   Problem List Items Addressed This Visit    Generalized anxiety disorder    INCREASE quetiapine to 150 mg, then 200 mg if needed. Consider addition of buspirone.      Relevant Medications   QUEtiapine (SEROQUEL) 100 MG tablet    Other Visit Diagnoses    Insomnia, unspecified type       due to anxiety. INCREASE quetiapine dose.   Relevant Medications   QUEtiapine (SEROQUEL) 100 MG tablet       Return in about 1 month (around 07/01/2017) for re-evalaution of mood, insomnia.   Fara Chute, PA-C Primary Care  at [facility]

## 2017-06-06 ENCOUNTER — Encounter: Payer: Self-pay | Admitting: Physician Assistant

## 2017-06-21 ENCOUNTER — Other Ambulatory Visit: Payer: Self-pay | Admitting: Physician Assistant

## 2017-06-21 DIAGNOSIS — K219 Gastro-esophageal reflux disease without esophagitis: Secondary | ICD-10-CM

## 2017-06-24 ENCOUNTER — Other Ambulatory Visit: Payer: Self-pay | Admitting: Physician Assistant

## 2017-06-24 DIAGNOSIS — M159 Polyosteoarthritis, unspecified: Secondary | ICD-10-CM

## 2017-06-25 ENCOUNTER — Other Ambulatory Visit: Payer: Self-pay | Admitting: Physician Assistant

## 2017-06-25 DIAGNOSIS — I10 Essential (primary) hypertension: Secondary | ICD-10-CM

## 2017-06-28 ENCOUNTER — Ambulatory Visit: Payer: Medicare HMO | Admitting: Neurology

## 2017-06-28 ENCOUNTER — Other Ambulatory Visit: Payer: Self-pay

## 2017-06-28 VITALS — BP 124/62 | HR 88 | Ht 65.0 in | Wt 190.0 lb

## 2017-06-28 DIAGNOSIS — R55 Syncope and collapse: Secondary | ICD-10-CM | POA: Diagnosis not present

## 2017-06-28 NOTE — Progress Notes (Signed)
NEUROLOGY CONSULTATION NOTE  Heather Horton MRN: [id number] DOB: 1945/11/23  Referring provider: [name], PA-C Primary care provider: [name], PA-C  Reason for consult:  Possible seizures  Thank you for your kind referral of Heather Horton for consultation of the above symptoms. Although Heather history is well known to you, please allow me to reiterate it for the purpose of our medical record. The patient was accompanied to the clinic by Heather Horton who also provides collateral information. Records and images were personally reviewed where available.  HISTORY OF PRESENT ILLNESS: This is a 72 year old right-handed woman with a history of hypertension, hyperlipidemia, hepatitis C, cervical cancer, presenting for evaluation of possible seizure. She was admitted to [facility] last 03/20/2017 for altered mental status. She lives alone and recalls getting ready for bed feeling fine, then waking up in the hospital. Heather Horton visits Heather daily except on weekends, she had seen Heather Friday and spoke to Heather on the phone the day prior. Heather Horton came Sunday noon and found Heather naked on the floor. She had fallen into a table in the den and Horton found Heather moaning with eyes rolling back, Heather right leg and arm were "flinching." They found Heather bed clothes folded on top of the bed. No tongue bite or incontinence. Heather Horton feels she was down for a long time because Heather lips were so dry. She was brought to [facility] where CBC showed WBC 10.9, MCV 101.5, CMP showed mildly elevated creatinine 1.04, CK level was elevated at 1528. UDS was positive for opioids. She had an MRI brain with and without contrast which I personally reviewed, no acute changes. EEG showed moderate diffuse background slowing, no epileptiform discharges. It was noted that symptoms were most likely resolving toxic metabolic encephalopathy in the setting of opiate overuse, however since unclear if seizure-related, she was continued on Keppra  500mg  BID.  Since hospital discharge, Heather Horton reports she is back to baseline. Heather Horton now administers medications. Heather Horton reports that prior to hospitalization, she was taking Tramadol, Tizanidine, and Trazodone. There is note of hydrocodone, however Heather Horton did not find any hydrocodone bottle in the house. She states she has not taken it that day. She is now on Tramadol TID which does not help with pain from fibromyalgia and osteoarthritis. She is scheduled to see Pain Management. Heather Horton denies any staring/unresponsive episodes, she denies any other gaps in time, olfactory/gustatory hallucinations, myoclonic jerks. She has tingling in Heather left arm and both feet L>R with associated pain. She has swelling in both knees. She does not drive. She has a history of spinal meningitis at age 63. Otherwise she had a normal birth and early development.  There is no history of febrile convulsions, significant traumatic brain injury, neurosurgical procedures, or family history of seizures.  PAST MEDICAL HISTORY: Past Medical History:  Diagnosis Date  . Acute encephalopathy   . Allergy   . Arthritis   . Bronchitis   . Bursitis   . Cervical cancer ([place])   . Chronic constipation   . Chronic hepatitis C without hepatic coma ([place]) 10/17/2014   11/10/2015 visit with Dr. Linus Salmons. Repeat labs revealed resolution of infection following treatment.  Page, [place], FNP-C; last seen 01/2014 Genotype 1b Diagnosed 01/2013  . Colon polyps   . Fibrocystic breast disease   . Galactorrhea on right side   . Hepatitis C   . Hyperlipidemia   . Hypertension   .  Insomnia   . Respiratory failure ([place]) 03/20/2017    PAST SURGICAL HISTORY: Past Surgical History:  Procedure Laterality Date  . ABDOMINAL HYSTERECTOMY  30's   due to cervical cancer  . APPENDECTOMY      MEDICATIONS: Current Outpatient Medications on File Prior to Visit  Medication Sig  Dispense Refill  . albuterol (PROVENTIL HFA;VENTOLIN HFA) 108 (90 Base) MCG/ACT inhaler Inhale 2 puffs into the lungs every 6 (six) hours as needed for wheezing. 1 Inhaler 1  . amLODipine (NORVASC) 10 MG tablet TAKE 1 TABLET BY MOUTH EVERY DAY 30 tablet 0  . atorvastatin (LIPITOR) 10 MG tablet Take 1 tablet (10 mg total) by mouth daily. 90 tablet 3  . hydrochlorothiazide (HYDRODIURIL) 25 MG tablet Take 1 tablet (25 mg total) by mouth daily. 90 tablet 3  . levETIRAcetam (KEPPRA) 500 MG tablet TAKE 1 TABLET BY MOUTH TWICE A DAY 60 tablet 0  . losartan (COZAAR) 100 MG tablet Take 1 tablet (100 mg total) by mouth daily. 90 tablet 3  . losartan-hydrochlorothiazide (HYZAAR) 100-25 MG tablet TAKE 1 TABLET BY MOUTH EVERY DAY 30 tablet 0  . naproxen sodium (ALEVE) 220 MG tablet Take 220 mg by mouth as needed (pain).    . nicotine (NICODERM CQ - DOSED IN MG/24 HOURS) 21 mg/24hr patch Place 1 patch (21 mg total) onto the skin daily. 28 patch 0  . pantoprazole (PROTONIX) 40 MG tablet TAKE 1 TABLET BY MOUTH DAILY AT 10 PM. 30 tablet 0  . QUEtiapine (SEROQUEL) 100 MG tablet Take 1.5-2 tablets (150-200 mg total) by mouth at bedtime. 60 tablet 3  . traMADol (ULTRAM) 50 MG tablet TAKE 1 TABLET BY MOUTH EVERY 6 HOURS AS NEEDED 120 tablet 0   No current facility-administered medications on file prior to visit.     ALLERGIES: Allergies  Allergen Reactions  . Molds & Smuts Other (See Comments)    Per allergy test  . Pollen Extract Other (See Comments)    Per allergy test  . Aspirin Nausea And Vomiting and Rash    FAMILY HISTORY: Family History  Problem Relation Age of Onset  . Asthma Mother   . Colon cancer Sister 51  . Stroke Brother 85  . Stroke Sister 56  . Arthritis Horton   . Pulmonary embolism Horton 26       on chronic warfarin  . Hypertension Son 58    SOCIAL HISTORY: Social History   Socioeconomic History  . Marital status: Single    Spouse name: n/a  . Number of children: 3  .  Years of education: 14+  . Highest education level: Not on file  Occupational History  . Occupation: retired    Comment: phlebotomy, EKG tech; taught both  Social Needs  . Financial resource strain: Not on file  . Food insecurity:    Worry: Not on file    Inability: Not on file  . Transportation needs:    Medical: Not on file    Non-medical: Not on file  Tobacco Use  . Smoking status: Current Every Day Smoker    Packs/day: 1.00    Years: 40.00    Pack years: 40.00    Types: Cigarettes  . Smokeless tobacco: Never Used  . Tobacco comment: I'm scared of e-cigarettes, interested in patches, tobacco info given 01/10/15  Substance and Sexual Activity  . Alcohol use: Yes    Alcohol/week: 8.4 - 10.8 oz    Types: 14 - 18 Standard drinks or equivalent per week  Comment: I love my brandy, I'm gonna drink a couple of drinks each evening, it helps me sleep  . Drug use: No  . Sexual activity: Not Currently  Lifestyle  . Physical activity:    Days per week: Not on file    Minutes per session: Not on file  . Stress: Not on file  Relationships  . Social connections:    Talks on phone: Not on file    Gets together: Not on file    Attends religious service: Not on file    Active member of club or organization: Not on file    Attends meetings of clubs or organizations: Not on file    Relationship status: Not on file  . Intimate partner violence:    Fear of current or ex partner: Not on file    Emotionally abused: Not on file    Physically abused: Not on file    Forced sexual activity: Not on file  Other Topics Concern  . Not on file  Social History Narrative   Raised in [place], [place].   Moved to [place], [place] and then came to [place] in 2013.   Lives alone.   Horton lives around the corner.    Raised Heather niece as Heather own Horton, and she lives nearby.   Son lives in [place].    REVIEW OF SYSTEMS: Constitutional: No fevers, chills, or sweats, no generalized fatigue,  change in appetite Eyes: No visual changes, double vision, eye pain Ear, nose and throat: No hearing loss, ear pain, nasal congestion, sore throat Cardiovascular: No chest pain, palpitations Respiratory:  No shortness of breath at rest or with exertion, wheezes GastrointestinaI: No nausea, vomiting, diarrhea, abdominal pain, fecal incontinence Genitourinary:  No dysuria, urinary retention or frequency Musculoskeletal:  No neck pain, back pain Integumentary: No rash, pruritus, skin lesions Neurological: as above Psychiatric: No depression, insomnia, anxiety Endocrine: No palpitations, fatigue, diaphoresis, mood swings, change in appetite, change in weight, increased thirst Hematologic/Lymphatic:  No anemia, purpura, petechiae. Allergic/Immunologic: no itchy/runny eyes, nasal congestion, recent allergic reactions, rashes  PHYSICAL EXAM: Vitals:   06/28/17 1356  BP: 124/62  Pulse: 88  SpO2: 97%   General: No acute distress Head:  Normocephalic/atraumatic Eyes: Fundoscopic exam shows bilateral sharp discs, no vessel changes, exudates, or hemorrhages Neck: supple, no paraspinal tenderness, full range of motion Back: No paraspinal tenderness Heart: regular rate and rhythm Lungs: Clear to auscultation bilaterally. Vascular: No carotid bruits. Skin/Extremities: No rash, no edema Neurological Exam: Mental status: alert and oriented to person, place, and time, no dysarthria or aphasia, Fund of knowledge is appropriate.  Recent and remote memory are intact.  Attention and concentration are normal.    Able to name objects and repeat phrases. Cranial nerves: CN I: not tested CN II: pupils equal, round and reactive to light, visual fields intact, fundi unremarkable. CN III, IV, VI:  full range of motion, no nystagmus, no ptosis CN V: decreased pin on left V1-3 CN VII: upper and lower face symmetric CN VIII: hearing intact to finger rub CN IX, X: gag intact, uvula midline CN XI:  sternocleidomastoid and trapezius muscles intact CN XII: tongue midline Bulk & Tone: normal, no fasciculations. Motor: 5/5 throughout with no pronator drift. Sensation: decreased pin on left UE and LE, decreased cold on left LE, decreased vibration to left ankle. Romberg test negative Deep Tendon Reflexes: +2 throughout, no ankle clonus, negative Hoffman sign Plantar responses: downgoing bilaterally Cerebellar: no incoordination on finger to nose, heel  to shin. No dysdiadochokinesia Gait: narrow-based and steady, able to tandem walk adequately. Tremor: none  IMPRESSION: This is a 72 year old right-handed woman with a history of f hypertension, hyperlipidemia, hepatitis C, cervical cancer, presenting for evaluation of possible seizure. She was admitted to [facility] last 03/20/2017 for altered mental status. She is amnestic of events, Horton found Heather with note of moaning with eyes rolling back, Heather right leg and arm were "flinching." Heather CK was elevated. Etiology of episode unclear, seizure is a possibility, however there may have been a component of polypharmacy. We discussed that after 1 seizure with normal imaging and EEG, seizure medication is typically not indicated. She was discharged home on Keppra, we will do a 24-hour EEG and if normal plan to wean off Keppra. We discussed that if symptoms become recurrent or more clearly seizure-related, Keppra will be restarted. She does not drive. She will follow-up in 6 months and knows to call for any changes.   Thank you for allowing me to participate in the care of this patient. Please do not hesitate to call for any questions or concerns.   Ellouise Newer, M.D.  CC: [name], PA-C

## 2017-06-28 NOTE — Patient Instructions (Signed)
1. Schedule 24-hour EEG 2. Continue Keppra 500mg  twice a day for now. We will call you with EEG results. If normal, we will give instructions on slowly weaning off medication to 1 tablet daily for a week, then 1 tablet every other day for a week, then stop 3. Follow-up in 6 months, call for any changes

## 2017-07-01 ENCOUNTER — Ambulatory Visit: Payer: Medicare HMO | Admitting: Physician Assistant

## 2017-07-06 ENCOUNTER — Encounter: Payer: Self-pay | Admitting: Neurology

## 2017-07-23 ENCOUNTER — Other Ambulatory Visit: Payer: Self-pay | Admitting: Physician Assistant

## 2017-07-23 DIAGNOSIS — K219 Gastro-esophageal reflux disease without esophagitis: Secondary | ICD-10-CM

## 2017-07-26 ENCOUNTER — Other Ambulatory Visit: Payer: Self-pay | Admitting: Physician Assistant

## 2017-07-26 DIAGNOSIS — I10 Essential (primary) hypertension: Secondary | ICD-10-CM

## 2017-07-29 ENCOUNTER — Other Ambulatory Visit: Payer: Self-pay

## 2017-07-29 ENCOUNTER — Ambulatory Visit (INDEPENDENT_AMBULATORY_CARE_PROVIDER_SITE_OTHER): Payer: Medicare HMO | Admitting: Physician Assistant

## 2017-07-29 ENCOUNTER — Encounter: Payer: Self-pay | Admitting: Physician Assistant

## 2017-07-29 VITALS — BP 120/70 | HR 93 | Temp 98.6°F | Resp 16 | Ht 64.0 in | Wt 191.0 lb

## 2017-07-29 DIAGNOSIS — F411 Generalized anxiety disorder: Secondary | ICD-10-CM | POA: Diagnosis not present

## 2017-07-29 DIAGNOSIS — G4700 Insomnia, unspecified: Secondary | ICD-10-CM | POA: Diagnosis not present

## 2017-07-29 DIAGNOSIS — I10 Essential (primary) hypertension: Secondary | ICD-10-CM

## 2017-07-29 DIAGNOSIS — F17200 Nicotine dependence, unspecified, uncomplicated: Secondary | ICD-10-CM

## 2017-07-29 DIAGNOSIS — M159 Polyosteoarthritis, unspecified: Secondary | ICD-10-CM | POA: Diagnosis not present

## 2017-07-29 DIAGNOSIS — R69 Illness, unspecified: Secondary | ICD-10-CM | POA: Diagnosis not present

## 2017-07-29 MED ORDER — TRAMADOL HCL 50 MG PO TABS: 100.0000 mg | ORAL_TABLET | Freq: Three times a day (TID) | ORAL | 0 refills | Status: DC

## 2017-07-29 MED ORDER — LOSARTAN POTASSIUM-HCTZ 100-25 MG PO TABS: 1.0000 | ORAL_TABLET | Freq: Every day | ORAL | 1 refills | Status: DC

## 2017-07-29 MED ORDER — TRAMADOL HCL 50 MG PO TABS: 100.0000 mg | ORAL_TABLET | Freq: Three times a day (TID) | ORAL | 0 refills | Status: DC | PRN

## 2017-07-29 MED ORDER — QUETIAPINE FUMARATE 300 MG PO TABS: 300.0000 mg | ORAL_TABLET | Freq: Every day | ORAL | 3 refills | Status: AC

## 2017-07-29 NOTE — Assessment & Plan Note (Signed)
Smoking cessation again encouraged.

## 2017-07-29 NOTE — Assessment & Plan Note (Signed)
Improved, but still not controlled. Increase quetiapine to 250 mg tonight, then 300 mg.

## 2017-07-29 NOTE — Progress Notes (Signed)
Patient ID: Heather Horton, female    DOB: [birth year], 72 y.o.   MRN: [id number]  PCP: [name], PA-C  Chief Complaint  Patient presents with  . Anxiety    follow up     Subjective:   Presents for evaluation of anxiety. She is accompanied by her daughter.  She notes improvement in her anxiety with the increased quetiapine dose, but not enough benefit. Tolerating it without adverse effects, and is interested in increasing the dose again. Her daughter relates that the patient was not nearly no anxious today in the car.  Tramadol 50 mg is not adequate to control her pain, and she is not as active as a result. She doesn't like to use the walker she has at home when her joint pain is worse. Has access to [employer], but has not activated her membership yet. Doesn't know how to swim, but is not afraid of the water.  Recall that her BP regimen has changed several times, due to manufacturer back order. Prior, she was taking Hyzaar 100-25 daily. Most recently, she has been taking amlodipine 5 mg and HCTZ 25 mg.  She reports some stuttering recently, and word finding difficulty. Has been improving recently. Unclear trigger.  Review of Systems  Constitutional: Negative.   HENT: Negative for sore throat.   Eyes: Negative for visual disturbance.  Respiratory: Negative for cough, chest tightness, shortness of breath and wheezing.   Cardiovascular: Negative for chest pain and palpitations.  Gastrointestinal: Negative for abdominal pain, diarrhea, nausea and vomiting.  Genitourinary: Negative for dysuria, frequency, hematuria and urgency.  Musculoskeletal: Negative for arthralgias and myalgias.  Skin: Negative for rash.  Neurological: Negative for dizziness, weakness and headaches.  Psychiatric/Behavioral: Negative for confusion, decreased concentration, dysphoric mood and sleep disturbance. The patient is nervous/anxious.     Depression screen [facility] 2/9 07/29/2017 06/03/2017 04/29/2017  04/08/2017 04/08/2017  Decreased Interest 0 0 0 0 0  Down, Depressed, Hopeless 0 0 0 0 0  PHQ - 2 Score 0 0 0 0 0  Altered sleeping - - - - -  Tired, decreased energy - - - - -  Change in appetite - - - - -  Feeling bad or failure about yourself  - - - - -  Trouble concentrating - - - - -  Moving slowly or fidgety/restless - - - - -  Suicidal thoughts - - - - -  PHQ-9 Score - - - - -  Difficult doing work/chores - - - - -   GAD 7 : Generalized Anxiety Score 07/29/2017  Nervous, Anxious, on Edge 3  Control/stop worrying 3  Worry too much - different things 3  Trouble relaxing 3  Restless 1  Easily annoyed or irritable 2  Afraid - awful might happen 3  Total GAD 7 Score 18  Anxiety Difficulty Somewhat difficult    Patient Active Problem List   Diagnosis Date Noted  . History of cervical cancer 04/29/2017  . Generalized anxiety disorder 07/22/2015  . Liver fibrosis 04/17/2015  . Family history of colon cancer 12/17/2014  . History of colonic polyps 12/17/2014  . Fibromyalgia 11/28/2014  . Osteoarthritis of multiple joints 10/17/2014  . Esophageal reflux 10/17/2014  . Benign essential HTN 10/17/2014  . Smoker 10/17/2014  . Chronic constipation   . Fibrocystic breast disease      Prior to Admission medications   Medication Sig Start Date End Date Taking? Authorizing Provider  albuterol (PROVENTIL HFA;VENTOLIN HFA) 108 (90 Base)  MCG/ACT inhaler Inhale 2 puffs into the lungs every 6 (six) hours as needed for wheezing. 04/08/17  Yes Mable Lashley, PA-C  amLODipine (NORVASC) 10 MG tablet TAKE 1 TABLET BY MOUTH EVERY DAY 05/27/17  Yes Lizandra Zakrzewski, PA-C  atorvastatin (LIPITOR) 10 MG tablet Take 1 tablet (10 mg total) by mouth daily. 04/30/17  Yes Ivey Nembhard, PA-C  hydrochlorothiazide (HYDRODIURIL) 25 MG tablet Take 1 tablet (25 mg total) by mouth daily. 04/29/17  Yes Veron Senner, PA-C  levETIRAcetam (KEPPRA) 500 MG tablet TAKE 1 TABLET BY MOUTH TWICE A DAY 07/27/17  Yes  Floy Riegler, PA-C  losartan (COZAAR) 100 MG tablet Take 1 tablet (100 mg total) by mouth daily. 04/29/17  Yes Zalen Sequeira, PA-C  losartan-hydrochlorothiazide (HYZAAR) 100-25 MG tablet TAKE 1 TABLET BY MOUTH EVERY DAY 07/27/17  Yes Kendel Pesnell, PA-C  naproxen sodium (ALEVE) 220 MG tablet Take 220 mg by mouth as needed (pain).   Yes [provider]  nicotine (NICODERM CQ - DOSED IN MG/24 HOURS) 21 mg/24hr patch Place 1 patch (21 mg total) onto the skin daily. 03/26/17  Yes Hall, Carole N, DO  pantoprazole (PROTONIX) 40 MG tablet TAKE 1 TABLET BY MOUTH DAILY AT 10 PM. 07/26/17  Yes Jshawn Hurta, PA-C  QUEtiapine (SEROQUEL) 100 MG tablet Take 1.5-2 tablets (150-200 mg total) by mouth at bedtime. 06/03/17  Yes Katieann Hungate, PA-C  traMADol (ULTRAM) 50 MG tablet TAKE 1 TABLET BY MOUTH EVERY 6 HOURS AS NEEDED 06/25/17  Yes Jacqulynn Cadet, Lynx Goodrich, PA-C     Allergies  Allergen Reactions  . Molds & Smuts Other (See Comments)    Per allergy test  . Pollen Extract Other (See Comments)    Per allergy test  . Aspirin Nausea And Vomiting and Rash       Objective:  Physical Exam  Constitutional: She is oriented to person, place, and time. She appears well-developed and well-nourished. She is active and cooperative. No distress.  BP 120/70   Pulse 93   Temp 98.6 F (37 C)   Resp 16   Ht 5\' 4"  (1.626 m)   Wt 191 lb (86.6 kg)   SpO2 95%   BMI 32.79 kg/m   HENT:  Head: Normocephalic and atraumatic.  Right Ear: Hearing normal.  Left Ear: Hearing normal.  Eyes: Conjunctivae are normal. No scleral icterus.  Neck: Normal range of motion. Neck supple. No thyromegaly present.  Cardiovascular: Normal rate, regular rhythm and normal heart sounds.  Pulses:      Radial pulses are 2+ on the right side, and 2+ on the left side.  Pulmonary/Chest: Effort normal and breath sounds normal.  Lymphadenopathy:       Head (right side): No tonsillar, no preauricular, no posterior auricular and no  occipital adenopathy present.       Head (left side): No tonsillar, no preauricular, no posterior auricular and no occipital adenopathy present.    She has no cervical adenopathy.       Right: No supraclavicular adenopathy present.       Left: No supraclavicular adenopathy present.  Neurological: She is alert and oriented to person, place, and time. No sensory deficit.  Skin: Skin is warm, dry and intact. No rash noted. No cyanosis or erythema. Nails show no clubbing.  Psychiatric: She has a normal mood and affect. Her speech is normal and behavior is normal. Judgment and thought content normal. Cognition and memory are normal.  No stuttering of speech until she began telling me about the stuttering.  Wt Readings from Last 3 Encounters:  07/29/17 191 lb (86.6 kg)  06/28/17 190 lb (86.2 kg)  06/03/17 188 lb 9.6 oz (85.5 kg)      Assessment & Plan:   Problem List Items Addressed This Visit    Smoker    Smoking cessation again encouraged.      Osteoarthritis of multiple joints - Primary    Increase tramadol to 100 mg TID.      Relevant Medications   traMADol (ULTRAM) 50 MG tablet   traMADol (ULTRAM) 50 MG tablet (Start on 08/28/2017)   Generalized anxiety disorder    Improved, but still not controlled. Increase quetiapine to 250 mg tonight, then 300 mg.      Relevant Medications   QUEtiapine (SEROQUEL) 300 MG tablet   Benign essential HTN    Well controlled on current regimen, but would prefer to resume Hyzaar 100-25.      Relevant Medications   losartan-hydrochlorothiazide (HYZAAR) 100-25 MG tablet    Other Visit Diagnoses    Insomnia, unspecified type       due to anxiety. INCREASE quetiapine dose.   Relevant Medications   QUEtiapine (SEROQUEL) 300 MG tablet       Return in about 6 weeks (around 09/09/2017) for re-evaluation of pain, sleep and anxiety.   Fara Chute, PA-C [facility]

## 2017-07-29 NOTE — Assessment & Plan Note (Signed)
Well controlled on current regimen, but would prefer to resume Hyzaar 100-25.

## 2017-07-29 NOTE — Patient Instructions (Addendum)
Tonight, take 2.5 tablets of the Seroquel (quetiapine). Tomorrow, start the new prescription for the 300 mg tablet, and take one each evening.  Once you know that the higher dose is working, and that you tolerate it well, you can use up the rest of the 100 mg tablets by taking 3 of them each evening.  If you do not tolerate the 300 mg dose, then you can go back to the 2 or 2.5 of the 100 mg tablets, and let me know.  ACTIVATE THE [employer]!!!!  Go ahead and call [facility] to schedule your next visit with me there. [phone].  IF you received an x-ray today, you will receive an invoice from [facility]. Please contact [facility] at [phone] with questions or concerns regarding your invoice.   IF you received labwork today, you will receive an invoice from [facility]. Please contact [facility] at [phone] with questions or concerns regarding your invoice.   Our billing staff will not be able to assist you with questions regarding bills from these companies.  You will be contacted with the lab results as soon as they are available. The fastest way to get your results is to activate your My Chart account. Instructions are located on the last page of this paperwork. If you have not heard from [place] regarding the results in 2 weeks, please contact this office.     Did you know that you begin to benefit from quitting smoking within the first twenty minutes? It's TRUE.  At 20 minutes: -blood pressure decreases -pulse rate drops -body temperature of hands and feet increases  At 8 hours: -carbon monoxide level in blood drops to normal -oxygen level in blood increases to normal  At 24 hours: -the chance of heart attack decreases  At 48 hours: -nerve endings start regrowing -ability to smell and taste is enhanced  2 weeks-3 months: -circulation improves -walking becomes easier -lung function improves  1-9 months: -coughing,  sinus congestion, fatigue and shortness of breath decreases  1 year: -excess risk of heart disease is decreased to HALF that of a smoker  5 years: Stroke risk is reduced to that of people who have never smoked  10 years: -risk of lung cancer drops to as little as half that of continuing smokers -risk of cancer of the mouth, throat, esophagus, bladder, kidney and pancreas decreases -risk of ulcer decreases  15 years -risk of heart disease is now similar to that of people who have never smoked -risk of death returns to nearly the level of people who have never smoked

## 2017-07-29 NOTE — Assessment & Plan Note (Signed)
Increase tramadol to 100 mg TID.

## 2017-08-02 ENCOUNTER — Telehealth: Payer: Self-pay | Admitting: Physician Assistant

## 2017-08-02 NOTE — Telephone Encounter (Signed)
PT STATED THAT SHE IS HAS A LUMP NEAR LT ARMPIT INFORMED HER THAT WE WOULD NEED AN ORDER FOR A DIAG MAMMO & [place], PT STATED THAT SHE WOULD CONTACT HER PROVIDER TO HAVE THAT SENT OVER-[facility]  Please advise this they want schedule pt for mmogram

## 2017-08-03 NOTE — Telephone Encounter (Signed)
Pt states that she doesn't really feel the lump anymore. Denies pain. Pt states that when you examined her you stated that it was just fat. Pt declined OV at this time and will have her daughter examine her breast.

## 2017-08-03 NOTE — Telephone Encounter (Signed)
Please call this patient. At her annual exam in March, she did not have a breast lump. Please get the details. She may need to return for examination of the lump.

## 2017-08-03 NOTE — Telephone Encounter (Signed)
Pt advised.

## 2017-08-03 NOTE — Telephone Encounter (Signed)
Please communicate with the patient and/or the imaging facility, that she can proceed with the screening mammogram ordered already.

## 2017-08-09 ENCOUNTER — Telehealth: Payer: Self-pay | Admitting: Physician Assistant

## 2017-08-09 NOTE — Telephone Encounter (Unsigned)
Copied from [place] [phone]. Topic: Quick Communication - Rx Refill/Question >> Aug 09, 2017  4:30 PM Neva Seat wrote: Needing to discuss the losartan potassium 100 mg pill. Please call Gershon Cull - daughter - [phone] back asap.

## 2017-08-09 NOTE — Telephone Encounter (Signed)
Pt daughter advised to dc amolodipine and continue losartan.

## 2017-08-17 ENCOUNTER — Ambulatory Visit: Payer: Medicare HMO | Admitting: Neurology

## 2017-08-17 DIAGNOSIS — R55 Syncope and collapse: Secondary | ICD-10-CM | POA: Diagnosis not present

## 2017-08-22 ENCOUNTER — Telehealth: Payer: Self-pay | Admitting: Neurology

## 2017-08-22 NOTE — Procedures (Signed)
ELECTROENCEPHALOGRAM REPORT  Dates of Recording: 08/17/2017 12:05PM to 08/18/2017 12:10PM  Patient's Name: Heather Horton MRN: [id number] Date of Birth: May 21, 1945  Referring Provider: Dr. Ellouise Newer  Procedure: 24-hour ambulatory EEG  History: This is a 72 year old woman with an episode of altered mental status with report of right leg and arm "flinching."  Medications:  KEPPRA 500 MG tablet  PROVENTIL HFA;VENTOLIN HFA 108 (90 Base) MCG/ACT inhaler  NORVASC 10 MG tablet  LIPITOR 10 MG tablet  HYDRODIURIL 25 MG tablet COZAAR 100 MG tablet  HYZAAR 100-25 MG tablet  ALEVE220 MG tablet   NICODERMCQ - DOSED IN MG/24 HOURS 21 mg/24hr patch PROTONIX 40 MG tablet  SEROQUEL 100 MG tablet  ULTRAM 50 MG tablet  Technical Summary: This is a 24-hour multichannel digital EEG recording measured by the international 10-20 system with electrodes applied with paste and impedances below 5000 ohms performed as portable with EKG monitoring.  The digital EEG was referentially recorded, reformatted, and digitally filtered in a variety of bipolar and referential montages for optimal display.    DESCRIPTION OF RECORDING: During maximal wakefulness, the background activity consisted of a symmetric 7 Hz posterior dominant rhythm which was reactive to eye opening.  There is a small amount of diffuse 5-7 Hz theta slowing of the waking background. There were no epileptiform discharges or focal slowing seen in wakefulness.  During the recording, the patient progresses through wakefulness, drowsiness, and Stage 2 sleep. There were occasional left midtemporal sharp waves seen during drowsiness and sleep.   Events: There were no push button events.   There were no electrographic seizures seen.  EKG lead was unremarkable.  IMPRESSION: This 24-hour ambulatory EEG study is abnormal due to occasional left midtemporal epileptiform discharges seen during sleep.  CLINICAL CORRELATION of the above findings  indicates possible tendency for seizures to arise from the left midtemporal region. Clinical correlation is advised.    Ellouise Newer, M.D.

## 2017-08-22 NOTE — Telephone Encounter (Signed)
Left VM on patient phone. Spoke to daughter Ms. Hairston, she would like to be with her mother when I discuss EEG results. Asked to call back Tues afternoon after 3:30pm.

## 2017-08-23 NOTE — Telephone Encounter (Signed)
Patient daughter is returning Dr Delice Lesch call about test results for mother

## 2017-08-23 NOTE — Telephone Encounter (Signed)
Discussed EEG findings with patient and daughter, showing left temporal epileptiform discharges and continuing Keppra 500mg  BID. No further seizures. She is feeling tired, but Seroquel was recently increased to 300mg  qhs. Instructed to try taking it earlier in the evening to help her sleep. F/u as scheduled in Nov.

## 2017-08-24 ENCOUNTER — Other Ambulatory Visit: Payer: Self-pay | Admitting: Physician Assistant

## 2017-08-24 DIAGNOSIS — K219 Gastro-esophageal reflux disease without esophagitis: Secondary | ICD-10-CM

## 2017-08-25 ENCOUNTER — Ambulatory Visit
Admission: RE | Admit: 2017-08-25 | Discharge: 2017-08-25 | Disposition: A | Discharge status: Home / Self Care | Payer: Medicare HMO | Source: Ambulatory Visit | Attending: Physician Assistant | Admitting: Physician Assistant

## 2017-08-25 DIAGNOSIS — Z1231 Encounter for screening mammogram for malignant neoplasm of breast: Secondary | ICD-10-CM | POA: Diagnosis not present

## 2017-09-21 ENCOUNTER — Other Ambulatory Visit: Payer: Self-pay | Admitting: Family Medicine

## 2017-09-21 DIAGNOSIS — K219 Gastro-esophageal reflux disease without esophagitis: Secondary | ICD-10-CM

## 2017-10-14 ENCOUNTER — Other Ambulatory Visit: Payer: Medicare HMO

## 2017-10-14 DIAGNOSIS — M25561 Pain in right knee: Secondary | ICD-10-CM | POA: Diagnosis not present

## 2017-10-14 DIAGNOSIS — M545 Low back pain: Secondary | ICD-10-CM | POA: Diagnosis not present

## 2017-10-14 DIAGNOSIS — G8929 Other chronic pain: Secondary | ICD-10-CM | POA: Diagnosis not present

## 2017-10-14 DIAGNOSIS — M25562 Pain in left knee: Secondary | ICD-10-CM | POA: Diagnosis not present

## 2017-10-14 DIAGNOSIS — D649 Anemia, unspecified: Secondary | ICD-10-CM | POA: Diagnosis not present

## 2017-10-14 DIAGNOSIS — M797 Fibromyalgia: Secondary | ICD-10-CM | POA: Diagnosis not present

## 2017-10-14 DIAGNOSIS — L309 Dermatitis, unspecified: Secondary | ICD-10-CM | POA: Diagnosis not present

## 2017-10-14 DIAGNOSIS — R69 Illness, unspecified: Secondary | ICD-10-CM | POA: Diagnosis not present

## 2017-10-14 DIAGNOSIS — I10 Essential (primary) hypertension: Secondary | ICD-10-CM | POA: Diagnosis not present

## 2017-10-17 ENCOUNTER — Other Ambulatory Visit: Payer: Self-pay | Admitting: Physician Assistant

## 2017-10-17 ENCOUNTER — Ambulatory Visit
Admission: RE | Admit: 2017-10-17 | Discharge: 2017-10-17 | Disposition: A | Discharge status: Home / Self Care | Payer: Medicare HMO | Source: Ambulatory Visit | Attending: Family Medicine | Admitting: Family Medicine

## 2017-10-17 DIAGNOSIS — M5136 Other intervertebral disc degeneration, lumbar region: Secondary | ICD-10-CM | POA: Diagnosis not present

## 2017-10-17 DIAGNOSIS — M1712 Unilateral primary osteoarthritis, left knee: Secondary | ICD-10-CM | POA: Diagnosis not present

## 2017-10-17 DIAGNOSIS — R52 Pain, unspecified: Secondary | ICD-10-CM

## 2017-10-17 DIAGNOSIS — M7989 Other specified soft tissue disorders: Secondary | ICD-10-CM | POA: Diagnosis not present

## 2017-12-09 DIAGNOSIS — M159 Polyosteoarthritis, unspecified: Secondary | ICD-10-CM | POA: Diagnosis not present

## 2017-12-09 DIAGNOSIS — S40819A Abrasion of unspecified upper arm, initial encounter: Secondary | ICD-10-CM | POA: Diagnosis not present

## 2017-12-09 DIAGNOSIS — Z23 Encounter for immunization: Secondary | ICD-10-CM | POA: Diagnosis not present

## 2017-12-09 DIAGNOSIS — L309 Dermatitis, unspecified: Secondary | ICD-10-CM | POA: Diagnosis not present

## 2017-12-09 DIAGNOSIS — M797 Fibromyalgia: Secondary | ICD-10-CM | POA: Diagnosis not present

## 2017-12-09 DIAGNOSIS — E7849 Other hyperlipidemia: Secondary | ICD-10-CM | POA: Diagnosis not present

## 2017-12-09 DIAGNOSIS — R69 Illness, unspecified: Secondary | ICD-10-CM | POA: Diagnosis not present

## 2017-12-09 DIAGNOSIS — E2839 Other primary ovarian failure: Secondary | ICD-10-CM | POA: Diagnosis not present

## 2017-12-09 DIAGNOSIS — R52 Pain, unspecified: Secondary | ICD-10-CM | POA: Diagnosis not present

## 2017-12-09 DIAGNOSIS — I10 Essential (primary) hypertension: Secondary | ICD-10-CM | POA: Diagnosis not present

## 2017-12-15 ENCOUNTER — Other Ambulatory Visit: Payer: Self-pay | Admitting: Physician Assistant

## 2017-12-15 DIAGNOSIS — E2839 Other primary ovarian failure: Secondary | ICD-10-CM

## 2018-01-13 DIAGNOSIS — Z136 Encounter for screening for cardiovascular disorders: Secondary | ICD-10-CM | POA: Diagnosis not present

## 2018-01-13 DIAGNOSIS — R69 Illness, unspecified: Secondary | ICD-10-CM | POA: Diagnosis not present

## 2018-01-13 DIAGNOSIS — Z0000 Encounter for general adult medical examination without abnormal findings: Secondary | ICD-10-CM | POA: Diagnosis not present

## 2018-01-13 DIAGNOSIS — M797 Fibromyalgia: Secondary | ICD-10-CM | POA: Diagnosis not present

## 2018-01-13 DIAGNOSIS — I10 Essential (primary) hypertension: Secondary | ICD-10-CM | POA: Diagnosis not present

## 2018-01-17 ENCOUNTER — Other Ambulatory Visit: Payer: Self-pay | Admitting: Physician Assistant

## 2018-01-17 DIAGNOSIS — Z136 Encounter for screening for cardiovascular disorders: Secondary | ICD-10-CM

## 2018-01-18 ENCOUNTER — Other Ambulatory Visit: Payer: Self-pay

## 2018-01-18 ENCOUNTER — Encounter: Payer: Self-pay | Admitting: Neurology

## 2018-01-18 ENCOUNTER — Ambulatory Visit: Payer: Medicare HMO | Admitting: Neurology

## 2018-01-18 VITALS — BP 94/56 | HR 101 | Ht 65.0 in | Wt 185.0 lb

## 2018-01-18 DIAGNOSIS — G40009 Localization-related (focal) (partial) idiopathic epilepsy and epileptic syndromes with seizures of localized onset, not intractable, without status epilepticus: Secondary | ICD-10-CM

## 2018-01-18 MED ORDER — LEVETIRACETAM 500 MG PO TABS: 500.0000 mg | ORAL_TABLET | Freq: Two times a day (BID) | ORAL | 3 refills | Status: DC

## 2018-01-18 NOTE — Progress Notes (Signed)
NEUROLOGY FOLLOW UP OFFICE NOTE  Heather Horton [id number] 1945/06/08  HISTORY OF PRESENT ILLNESS: I had the pleasure of seeing Heather Horton in follow-up in the neurology clinic on 01/18/2018. She is again accompanied by her daughter who helps supplement the history today. The patient was last seen 7 months ago for an episode of altered mental status last January 2019 when she was found naked on the floor with elevated CK. MRI brain unremarkable, EEG showed moderate diffuse slowing. She was discharged home on Keppra 500mg  BID. She had a 24-hour EEG in June 2019 which was abnormal, showing occasional left mid-temporal epileptiform discharges during sleep, typical events were not captured. She continues to take Keppra 500mg  BID without further similar episodes, no side effects. She and her daughter deny any staring/unresponsive episodes, gaps in time, olfactory/gustatory hallucinations, focal numbness/tingling/weakness. Her daughter notices occasional twitching when she visits. She has a lot of anxiety when riding in a car, and does not drive. Her PCP recently prescribed a prn anxiety medication. She is not feeling well today, feeling dizzy. BP is 94/56. She states it was a little high yesterday. She has not had anything to eat yet and has taken all her morning medications. Her daughter reports her back was really bothering her this morning. Daughter fixes her pillbox, she takes her own medications "majority of the time."  History on Initial Assessment 06/28/2017: This is a 72 year old right-handed woman with a history of hypertension, hyperlipidemia, hepatitis C, cervical cancer, presenting for evaluation of possible seizure. She was admitted to [facility] last 03/20/2017 for altered mental status. She lives alone and recalls getting ready for bed feeling fine, then waking up in the hospital. Her daughter visits her daily except on weekends, she had seen her Friday and spoke to her on the phone the day prior. Her  daughter came Sunday noon and found her naked on the floor. She had fallen into a table in the den and daughter found her moaning with eyes rolling back, her right leg and arm were "flinching." They found her bed clothes folded on top of the bed. No tongue bite or incontinence. Her daughter feels she was down for a long time because her lips were so dry. She was brought to [facility] where CBC showed WBC 10.9, MCV 101.5, CMP showed mildly elevated creatinine 1.04, CK level was elevated at 1528. UDS was positive for opioids. She had an MRI brain with and without contrast which I personally reviewed, no acute changes. EEG showed moderate diffuse background slowing, no epileptiform discharges. It was noted that symptoms were most likely resolving toxic metabolic encephalopathy in the setting of opiate overuse, however since unclear if seizure-related, she was continued on Keppra 500mg  BID.  Since hospital discharge, her daughter reports she is back to baseline. Her daughter now administers medications. Her daughter reports that prior to hospitalization, she was taking Tramadol, Tizanidine, and Trazodone. There is note of hydrocodone, however her daughter did not find any hydrocodone bottle in the house. She states she has not taken it that day. She is now on Tramadol TID which does not help with pain from fibromyalgia and osteoarthritis. She is scheduled to see Pain Management. Her daughter denies any staring/unresponsive episodes, she denies any other gaps in time, olfactory/gustatory hallucinations, myoclonic jerks. She has tingling in her left arm and both feet L>R with associated pain. She has swelling in both knees. She does not drive. She has a history of spinal meningitis at age 72. Otherwise she  had a normal birth and early development.  There is no history of febrile convulsions, significant traumatic brain injury, neurosurgical procedures, or family history of seizures.  PAST MEDICAL HISTORY: Past Medical  History:  Diagnosis Date  . Acute encephalopathy   . Allergy   . Arthritis   . Bronchitis   . Bursitis   . Cervical cancer ([place])   . Chronic constipation   . Chronic hepatitis C without hepatic coma ([place]) 10/17/2014   11/10/2015 visit with Dr. Linus Salmons. Repeat labs revealed resolution of infection following treatment.  [place], [place], FNP-C; last seen 01/2014 Genotype 1b Diagnosed 01/2013  . Colon polyps   . Fibrocystic breast disease   . Galactorrhea on right side   . Hepatitis C   . Hyperlipidemia   . Hypertension   . Insomnia   . Respiratory failure ([place]) 03/20/2017    MEDICATIONS: Current Outpatient Medications on File Prior to Visit  Medication Sig Dispense Refill  . albuterol (PROVENTIL HFA;VENTOLIN HFA) 108 (90 Base) MCG/ACT inhaler Inhale 2 puffs into the lungs every 6 (six) hours as needed for wheezing. 1 Inhaler 1  . atorvastatin (LIPITOR) 10 MG tablet Take 1 tablet (10 mg total) by mouth daily. 90 tablet 3  . levETIRAcetam (KEPPRA) 500 MG tablet TAKE 1 TABLET BY MOUTH TWICE A DAY 60 tablet 0  . losartan-hydrochlorothiazide (HYZAAR) 100-25 MG tablet Take 1 tablet by mouth daily. 90 tablet 1  . naproxen sodium (ALEVE) 220 MG tablet Take 220 mg by mouth as needed (pain).    . nicotine (NICODERM CQ - DOSED IN MG/24 HOURS) 21 mg/24hr patch Place 1 patch (21 mg total) onto the skin daily. 28 patch 0  . pantoprazole (PROTONIX) 40 MG tablet TAKE 1 TABLET BY MOUTH DAILY AT 10 PM. 30 tablet 0  . QUEtiapine (SEROQUEL) 300 MG tablet Take 1 tablet (300 mg total) by mouth at bedtime. 90 tablet 3  . traMADol (ULTRAM) 50 MG tablet Take 2 tablets (100 mg total) by mouth 3 (three) times daily. 180 tablet 0  . traMADol (ULTRAM) 50 MG tablet Take 2 tablets (100 mg total) by mouth 3 (three) times daily as needed. 180 tablet 0   No current facility-administered medications on file prior to visit.     ALLERGIES: Allergies  Allergen Reactions  .  Molds & Smuts Other (See Comments)    Per allergy test  . Pollen Extract Other (See Comments)    Per allergy test  . Aspirin Nausea And Vomiting and Rash    FAMILY HISTORY: Family History  Problem Relation Age of Onset  . Asthma Mother   . Colon cancer Sister 38  . Stroke Brother 26  . Stroke Sister 63  . Cancer Sister 59       lung cancer, +TOBACCO  . Arthritis Daughter   . Pulmonary embolism Daughter 76       on chronic warfarin  . Hypertension Son 74    SOCIAL HISTORY: Social History   Socioeconomic History  . Marital status: Single    Spouse name: n/a  . Number of children: 3  . Years of education: 14+  . Highest education level: Not on file  Occupational History  . Occupation: retired    Comment: phlebotomy, EKG tech; taught both  Social Needs  . Financial resource strain: Not on file  . Food insecurity:    Worry: Not on file    Inability: Not on file  . Transportation needs:  Medical: Not on file    Non-medical: Not on file  Tobacco Use  . Smoking status: Current Every Day Smoker    Packs/day: 1.00    Years: 40.00    Pack years: 40.00    Types: Cigarettes  . Smokeless tobacco: Never Used  . Tobacco comment: I'm scared of e-cigarettes, interested in patches, tobacco info given 01/10/15  Substance and Sexual Activity  . Alcohol use: Yes    Alcohol/week: 14.0 - 18.0 standard drinks    Types: 14 - 18 Standard drinks or equivalent per week    Comment: I love my brandy, I'm gonna drink a couple of drinks each evening, it helps me sleep  . Drug use: No  . Sexual activity: Not Currently  Lifestyle  . Physical activity:    Days per week: Not on file    Minutes per session: Not on file  . Stress: Not on file  Relationships  . Social connections:    Talks on phone: Not on file    Gets together: Not on file    Attends religious service: Not on file    Active member of club or organization: Not on file    Attends meetings of clubs or organizations: Not  on file    Relationship status: Not on file  . Intimate partner violence:    Fear of current or ex partner: Not on file    Emotionally abused: Not on file    Physically abused: Not on file    Forced sexual activity: Not on file  Other Topics Concern  . Not on file  Social History Narrative   Raised in [place], [place].   Moved to [place], [place] and then came to [place] in 2013.   Lives alone.   Daughter lives around the corner.    Raised her niece as her own daughter, and she lives nearby.   Son lives in [place].    REVIEW OF SYSTEMS: Constitutional: No fevers, chills, or sweats, no generalized fatigue, change in appetite Eyes: No visual changes, double vision, eye pain Ear, nose and throat: No hearing loss, ear pain, nasal congestion, sore throat Cardiovascular: No chest pain, palpitations Respiratory:  No shortness of breath at rest or with exertion, wheezes GastrointestinaI: No nausea, vomiting, diarrhea, abdominal pain, fecal incontinence Genitourinary:  No dysuria, urinary retention or frequency Musculoskeletal:  No neck pain,+ back pain Integumentary: No rash, pruritus, skin lesions Neurological: as above Psychiatric: No depression, insomnia,+ anxiety Endocrine: No palpitations, fatigue, diaphoresis, mood swings, change in appetite, change in weight, increased thirst Hematologic/Lymphatic:  No anemia, purpura, petechiae. Allergic/Immunologic: no itchy/runny eyes, nasal congestion, recent allergic reactions, rashes  PHYSICAL EXAM: Vitals:   01/18/18 1007  BP: (!) 94/56  Pulse: (!) 101  SpO2: 97%   General: No acute distress, reports feeling unwell Head:  Normocephalic/atraumatic Neck: supple, no paraspinal tenderness, full range of motion Heart:  Regular rate and rhythm Lungs:  Clear to auscultation bilaterally Back: No paraspinal tenderness Skin/Extremities: No rash, no edema Neurological Exam: alert and oriented to person, place, and time. No aphasia or  dysarthria. Fund of knowledge is appropriate.  Recent and remote memory are intact.  Attention and concentration are normal.    Able to name objects and repeat phrases. Cranial nerves: Pupils equal, round, reactive to light.  Extraocular movements intact with no nystagmus. Visual fields full. Facial sensation intact. No facial asymmetry. Tongue, uvula, palate midline.  Motor: Bulk and tone normal, muscle strength 5/5 throughout with no pronator drift.  Sensation to light touch intact.  No extinction to double simultaneous stimulation. Finger to nose testing intact. Gait slow and cautious, feeling unwell when standing up. Occasional hand tremor with finger to nose testing with eyes closed.  IMPRESSION: This is a 72 yo RH woman with a history of f hypertension, hyperlipidemia, hepatitis C, cervical cancer, who had an episode of altered mental status in January 2019. MRI brain unremarkable. Her 24-hour EEG was abnormal with occasional left mid-temporal epileptiform discharges. We discussed the diagnosis of left temporal lobe epilepsy, continue Keppra 500mg  BID, no further episodes since January 2019. No side effects. She is feeling unwell today with low BP, she has not had any breakfast, repeat BP will be done. If she continues to be symptomatic, she knows to call her PCP or go to the ER. She does not drive. She will follow-up in 6 months and knows to call for any changes  Thank you for allowing me to participate in her care.  Please do not hesitate to call for any questions or concerns.  The duration of this appointment visit was 30 minutes of face-to-face time with the patient.  Greater than 50% of this time was spent in counseling, explanation of diagnosis, planning of further management, and coordination of care.   Ellouise Newer, M.D.   CC: [name], [place]

## 2018-01-18 NOTE — Patient Instructions (Signed)
1. Continue Levetiracetam 500mg  twice a day 2. If you continue feeling unwell, please speak to your PCP  3. Follow-up in 6 months, call for any changes.  Seizure Precautions: 1. If medication has been prescribed for you to prevent seizures, take it exactly as directed.  Do not stop taking the medicine without talking to your doctor first, even if you have not had a seizure in a long time.   2. Avoid activities in which a seizure would cause danger to yourself or to others.  Don't operate dangerous machinery, swim alone, or climb in high or dangerous places, such as on ladders, roofs, or girders.  Do not drive unless your doctor says you may.  3. If you have any warning that you may have a seizure, lay down in a safe place where you can't hurt yourself.    4.  No driving for 6 months from last seizure, as per [facility].   Please refer to the following link on the [employer] website for more information: http://[email or web address].org/answerplace/[email or web address]/driving/drivingu.cfm   5.  Maintain good sleep hygiene. Avoid alcohol.  6.  Contact your doctor if you have any problems that may be related to the medicine you are taking.  7.  Call 911 and bring the patient back to the ED if:        A.  The seizure lasts longer than 5 minutes.       B.  The patient doesn't awaken shortly after the seizure  C.  The patient has new problems such as difficulty seeing, speaking or moving  D.  The patient was injured during the seizure  E.  The patient has a temperature over 102 F (39C)  F.  The patient vomited and now is having trouble breathing

## 2018-02-17 ENCOUNTER — Ambulatory Visit
Admission: RE | Admit: 2018-02-17 | Discharge: 2018-02-17 | Disposition: A | Discharge status: Home / Self Care | Payer: Medicare HMO | Source: Ambulatory Visit | Attending: Physician Assistant | Admitting: Physician Assistant

## 2018-02-17 DIAGNOSIS — Z136 Encounter for screening for cardiovascular disorders: Secondary | ICD-10-CM

## 2018-02-17 DIAGNOSIS — Z13820 Encounter for screening for osteoporosis: Secondary | ICD-10-CM | POA: Diagnosis not present

## 2018-02-17 DIAGNOSIS — Z87891 Personal history of nicotine dependence: Secondary | ICD-10-CM | POA: Diagnosis not present

## 2018-02-17 DIAGNOSIS — E2839 Other primary ovarian failure: Secondary | ICD-10-CM

## 2018-02-17 DIAGNOSIS — Z8679 Personal history of other diseases of the circulatory system: Secondary | ICD-10-CM | POA: Diagnosis not present

## 2018-02-19 ENCOUNTER — Encounter: Payer: Self-pay | Admitting: Gastroenterology

## 2018-04-17 ENCOUNTER — Encounter: Payer: Self-pay | Admitting: Gastroenterology

## 2018-05-01 ENCOUNTER — Encounter: Payer: Self-pay | Admitting: Gastroenterology

## 2018-05-01 ENCOUNTER — Ambulatory Visit (AMBULATORY_SURGERY_CENTER): Payer: Self-pay

## 2018-05-01 VITALS — Ht 65.0 in | Wt 193.4 lb

## 2018-05-01 DIAGNOSIS — Z800 Family history of malignant neoplasm of digestive organs: Secondary | ICD-10-CM

## 2018-05-01 MED ORDER — PEG 3350-KCL-NA BICARB-NACL 420 G PO SOLR: 4000.0000 mL | Freq: Once | ORAL | 0 refills | Status: AC

## 2018-05-01 NOTE — Progress Notes (Signed)
Denies allergies to eggs or soy products. Denies complication of anesthesia or sedation. Denies use of weight loss medication. Denies use of O2.   Emmi instructions declined.  

## 2018-05-15 ENCOUNTER — Ambulatory Visit (AMBULATORY_SURGERY_CENTER): Payer: Medicare HMO | Admitting: Gastroenterology

## 2018-05-15 ENCOUNTER — Other Ambulatory Visit: Payer: Self-pay

## 2018-05-15 ENCOUNTER — Encounter: Payer: Self-pay | Admitting: Gastroenterology

## 2018-05-15 VITALS — BP 132/66 | HR 87 | Temp 98.0°F | Resp 11 | Ht 65.0 in | Wt 193.0 lb

## 2018-05-15 DIAGNOSIS — Z800 Family history of malignant neoplasm of digestive organs: Secondary | ICD-10-CM

## 2018-05-15 DIAGNOSIS — Z86010 Personal history of colonic polyps: Secondary | ICD-10-CM

## 2018-05-15 DIAGNOSIS — K5790 Diverticulosis of intestine, part unspecified, without perforation or abscess without bleeding: Secondary | ICD-10-CM

## 2018-05-15 DIAGNOSIS — Z1211 Encounter for screening for malignant neoplasm of colon: Secondary | ICD-10-CM

## 2018-05-15 MED ORDER — SODIUM CHLORIDE 0.9 % IV SOLN: 500.0000 mL | Freq: Once | INTRAVENOUS | Status: DC

## 2018-05-15 NOTE — Progress Notes (Signed)
Report given to PACU, vss 

## 2018-05-15 NOTE — Op Note (Signed)
[facility] Patient Name: Heather Horton Procedure Date: 05/15/2018 10:48 AM MRN: [id number] Endoscopist: Milus Banister , MD Age: 73 Referring MD:  Date of Birth: 03-07-1945 Gender: Female Account #: [id number] Procedure:                Colonoscopy Indications:              High risk colon cancer surveillance: Personal                            history of colonic polyps; colonoscopy 2016 nine                            adenomas, one was >1cm Medicines:                Monitored Anesthesia Care Procedure:                Pre-Anesthesia Assessment:                           - Prior to the procedure, a History and Physical                            was performed, and patient medications and                            allergies were reviewed. The patient's tolerance of                            previous anesthesia was also reviewed. The risks                            and benefits of the procedure and the sedation                            options and risks were discussed with the patient.                            All questions were answered, and informed consent                            was obtained. Prior Anticoagulants: The patient has                            taken no previous anticoagulant or antiplatelet                            agents. ASA Grade Assessment: II - A patient with                            mild systemic disease. After reviewing the risks                            and benefits, the patient was deemed in  satisfactory condition to undergo the procedure.                           After obtaining informed consent, the colonoscope                            was passed under direct vision. Throughout the                            procedure, the patient's blood pressure, pulse, and                            oxygen saturations were monitored continuously. The                            Model PCF-H190DL [id number]) scope was  introduced                            through the anus and advanced to the the cecum,                            identified by appendiceal orifice and ileocecal                            valve. The colonoscopy was performed without                            difficulty. The patient tolerated the procedure                            well. The quality of the bowel preparation was                            good. The ileocecal valve, appendiceal orifice, and                            rectum were photographed. Scope In: 11:11:41 AM Scope Out: 11:28:21 AM Scope Withdrawal Time: 0 hours 7 minutes 5 seconds  Total Procedure Duration: 0 hours 16 minutes 40 seconds  Findings:                 Multiple small-mouthed diverticula were found in                            the left colon.                           The exam was otherwise without abnormality on                            direct and retroflexion views. Complications:            No immediate complications. Estimated blood loss:                            None. Estimated Blood  Loss:     Estimated blood loss: none. Impression:               - Diverticulosis in the left colon.                           - The examination was otherwise normal on direct                            and retroflexion views.                           - No specimens collected. Recommendation:           - Patient has a contact number available for                            emergencies. The signs and symptoms of potential                            delayed complications were discussed with the                            patient. Return to normal activities tomorrow.                            Written discharge instructions were provided to the                            patient.                           - Resume previous diet.                           - Continue present medications.                           - Repeat colonoscopy in 5 years for screening. Milus Banister, MD 05/15/2018 11:36:35 AM This report has been signed electronically.

## 2018-05-15 NOTE — Progress Notes (Signed)
Pt. Reports no change in her medical or surgical history since her pre-visit 05/01/2018.

## 2018-05-15 NOTE — Patient Instructions (Signed)
YOU HAD AN ENDOSCOPIC PROCEDURE TODAY AT [facility]:   Refer to the procedure report that was given to you for any specific questions about what was found during the examination.  If the procedure report does not answer your questions, please call your gastroenterologist to clarify.  If you requested that your care partner not be given the details of your procedure findings, then the procedure report has been included in a sealed envelope for you to review at your convenience later.  YOU SHOULD EXPECT: Some feelings of bloating in the abdomen. Passage of more gas than usual.  Walking can help get rid of the air that was put into your GI tract during the procedure and reduce the bloating. If you had a lower endoscopy (such as a colonoscopy or flexible sigmoidoscopy) you may notice spotting of blood in your stool or on the toilet paper. If you underwent a bowel prep for your procedure, you may not have a normal bowel movement for a few days.  Please Note:  You might notice some irritation and congestion in your nose or some drainage.  This is from the oxygen used during your procedure.  There is no need for concern and it should clear up in a day or so.  SYMPTOMS TO REPORT IMMEDIATELY:   Following lower endoscopy (colonoscopy or flexible sigmoidoscopy):  Excessive amounts of blood in the stool  Significant tenderness or worsening of abdominal pains  Swelling of the abdomen that is new, acute  Fever of 100F or higher   For urgent or emergent issues, a gastroenterologist can be reached at any hour by calling [phone].   DIET:  We do recommend a small meal at first, but then you may proceed to your regular diet.  Drink plenty of fluids but you should avoid alcoholic beverages for 24 hours. Try to eat a high fiber diet, and drink plenty of water.  ACTIVITY:  You should plan to take it easy for the rest of today and you should NOT DRIVE or use heavy machinery until tomorrow  (because of the sedation medicines used during the test).    FOLLOW UP: Our staff will call the number listed on your records the next business day following your procedure to check on you and address any questions or concerns that you may have regarding the information given to you following your procedure. If we do not reach you, we will leave a message.  However, if you are feeling well and you are not experiencing any problems, there is no need to return our call.  We will assume that you have returned to your regular daily activities without incident.   SIGNATURES/CONFIDENTIALITY: You and/or your care partner have signed paperwork which will be entered into your electronic medical record.  These signatures attest to the fact that that the information above on your After Visit Summary has been reviewed and is understood.  Full responsibility of the confidentiality of this discharge information lies with you and/or your care-partner.  Read all handouts given to you by your recovery room nurse.

## 2018-05-16 ENCOUNTER — Telehealth: Payer: Self-pay | Admitting: *Deleted

## 2018-05-16 NOTE — Telephone Encounter (Signed)
  Follow up Call-  Call back number 05/15/2018  Post procedure Call Back phone  # [phone]  Permission to leave phone message Yes  Some recent data might be hidden     Patient questions:  Do you have a fever, pain , or abdominal swelling? No. Pain Score  0 *  Have you tolerated food without any problems? Yes.    Have you been able to return to your normal activities? Yes.    Do you have any questions about your discharge instructions: Diet   No. Medications  No. Follow up visit  No.  Do you have questions or concerns about your Care? No.  Actions: * If pain score is 4 or above: No action needed, pain <4.

## 2018-08-02 ENCOUNTER — Encounter: Payer: Self-pay | Admitting: Neurology

## 2018-08-09 ENCOUNTER — Ambulatory Visit: Payer: Medicare HMO | Admitting: Neurology

## 2018-10-13 ENCOUNTER — Other Ambulatory Visit: Payer: Self-pay | Admitting: Physician Assistant

## 2018-10-13 DIAGNOSIS — Z1231 Encounter for screening mammogram for malignant neoplasm of breast: Secondary | ICD-10-CM

## 2018-11-30 ENCOUNTER — Ambulatory Visit: Payer: Medicare HMO

## 2019-01-12 ENCOUNTER — Ambulatory Visit
Admission: RE | Admit: 2019-01-12 | Discharge: 2019-01-12 | Disposition: A | Discharge status: Home / Self Care | Payer: Medicare HMO | Source: Ambulatory Visit | Attending: Physician Assistant | Admitting: Physician Assistant

## 2019-01-12 ENCOUNTER — Other Ambulatory Visit: Payer: Self-pay

## 2019-01-12 DIAGNOSIS — Z1231 Encounter for screening mammogram for malignant neoplasm of breast: Secondary | ICD-10-CM

## 2019-04-09 ENCOUNTER — Other Ambulatory Visit: Payer: Self-pay | Admitting: Neurology

## 2019-12-31 ENCOUNTER — Telehealth: Payer: Self-pay | Admitting: Neurology

## 2019-12-31 NOTE — Telephone Encounter (Signed)
Patient's daughter called in to make an appointment. I scheduled the patient for 09/06/19. She wanted Dr. Delice Lesch to know the patient has lost 18 pounds because she has not been eating for the last 3 weeks. She will have a teaspoon of this and that from time to time. Her PCP recommended her to see Dr. Delice Lesch and seemed concerned.

## 2019-12-31 NOTE — Telephone Encounter (Signed)
Sent message to front staff and they left patient a message advising her to contact the office.

## 2019-12-31 NOTE — Telephone Encounter (Signed)
I have an 11:30am opening this Thurs, pls ask if they can make it then. Thanks

## 2020-01-07 ENCOUNTER — Encounter: Payer: Self-pay | Admitting: Neurology

## 2020-01-07 ENCOUNTER — Ambulatory Visit: Payer: Medicare HMO | Admitting: Neurology

## 2020-01-07 ENCOUNTER — Other Ambulatory Visit: Payer: Self-pay

## 2020-01-07 VITALS — BP 114/79 | HR 92 | Ht 65.0 in | Wt 177.6 lb

## 2020-01-07 DIAGNOSIS — G40009 Localization-related (focal) (partial) idiopathic epilepsy and epileptic syndromes with seizures of localized onset, not intractable, without status epilepticus: Secondary | ICD-10-CM

## 2020-01-07 DIAGNOSIS — R413 Other amnesia: Secondary | ICD-10-CM | POA: Diagnosis not present

## 2020-01-07 DIAGNOSIS — R630 Anorexia: Secondary | ICD-10-CM

## 2020-01-07 NOTE — Patient Instructions (Signed)
1. Schedule MRI brain with and without contrast  2. Follow-up with PCP and continue with workup on weight loss, loss of appetite. Discuss consideration for Mirtazapine at bedtime to help with appetite, mood, sleep. Also discuss referral to Ortho for back pain  3. Continue working on slowly getting more nutrition in  4. Follow-up in 6-8 months, call for any changes

## 2020-01-07 NOTE — Progress Notes (Signed)
NEUROLOGY FOLLOW UP OFFICE NOTE  Heather Horton [id number] 04/10/1945  HISTORY OF PRESENT ILLNESS: I had the pleasure of seeing Heather Horton in follow-up in the neurology clinic on 01/07/2020.  The patient was last seen 2 year ago for seizures. She is again accompanied by her daughter who helps supplement the history today.  Records and images were personally reviewed where available. She had seen her PCP last week reporting 2-3 weeks of loss of appetite, saying she could not swallow food. No pain, nausea, unsure why she could not swallow. Her daughter reported she chews for a very long time and seems to need a reminder to swallow. Food her daughter brings has been untouched. She was primarily reporting back pain and hydrocodone dose was increased from 5mg  to 7.5mg , then to every 8 hours. Laboratory data from PCP reviewed, her urinalysis grew greater than 100,000 CFU/mL of Klebsiella oxytoca. She was treated with a 3-day course of antibiotics. Her creatinine level was elevated 1.47, Na 130. Spinal xrays no acute changes, there was cervical and lumbar spondylosis at several levels.   They both report that all her symptoms started rather acutely 3-4 weeks ago. She started having a lot of back pain, to the point where she cannot take a bath without assistance. She has not been eating, despite her daughter making/bringing her favorite food. The fridge and cabinets are still full of uneaten food. She only takes a spoonful of Jello or a little bit of soup. She states she does not feel hungry and has lost 22 lbs in a short period of time. When she had applesauce, she got nauseated and vomited. There have been a couple of times she has thrown up, she starts coughing and spitting up phlegm. She cannot walk long distances due to back pain, her knees also give way. She now has to use a rolling walker. She urinates frequently and recalls only one day of dysuria. She has some constipation, no blood in stool. She is not  sleeping well at night, her daughter has noticed she sleeps a lot during the day. Family started noticing memory changes a month ago or so, she loses train of thought mid-sentence. She seems to hear what they say but does not comprehend it. Her daughter comes twice a day to administer medications. She does not drive. No hallucinations or paranoia. She has not had Heather seizures since 2019, no staring/unresponsive episodes. She continues to take Levetiracetam 500mg  BID. She has sciatica on her left side, but feels her right side is weaker. She states her back is hurting her so bad. When asked about mood, she is "not sure," she does not feel happy and it makes her mad when her daughter tries to help her. She has been taking Seroquel 300mg  qhs for many years.    History on Initial Assessment 06/28/2017: This is a 74 year old right-handed woman with a history of hypertension, hyperlipidemia, hepatitis C, cervical cancer, presenting for evaluation of possible seizure. She was admitted to [facility] last 03/20/2017 for altered mental status. She lives alone and recalls getting ready for bed feeling fine, then waking up in the hospital. Her daughter visits her daily except on weekends, she had seen her Friday and spoke to her on the phone the day prior. Her daughter came Sunday noon and found her naked on the floor. She had fallen into a table in the den and daughter found her moaning with eyes rolling back, her right leg and arm were "flinching." They  found her bed clothes folded on top of the bed. No tongue bite or incontinence. Her daughter feels she was down for a long time because her lips were so dry. She was brought to [facility] where CBC showed WBC 10.9, MCV 101.5, CMP showed mildly elevated creatinine 1.04, CK level was elevated at 1528. UDS was positive for opioids. She had an MRI brain with and without contrast which I personally reviewed, no acute changes. EEG showed moderate diffuse background slowing, no epileptiform  discharges. It was noted that symptoms were most likely resolving toxic metabolic encephalopathy in the setting of opiate overuse, however since unclear if seizure-related, she was continued on Keppra 500mg  BID.  Since hospital discharge, her daughter reports she is back to baseline. Her daughter now administers medications. Her daughter reports that prior to hospitalization, she was taking Tramadol, Tizanidine, and Trazodone. There is note of hydrocodone, however her daughter did not find Heather hydrocodone bottle in the house. She states she has not taken it that day. She is now on Tramadol TID which does not help with pain from fibromyalgia and osteoarthritis. She is scheduled to see Pain Management. Her daughter denies Heather staring/unresponsive episodes, she denies Heather other gaps in time, olfactory/gustatory hallucinations, myoclonic jerks. She has tingling in her left arm and both feet L>R with associated pain. She has swelling in both knees. She does not drive. She has a history of spinal meningitis at age 15. Otherwise she had a normal birth and early development.  There is no history of febrile convulsions, significant traumatic brain injury, neurosurgical procedures, or family history of seizures.  Diagnostic Data:  MRI brain in 03/2017 was unremarkable EEG in 03/2017 showed moderate diffuse slowing 24-hour EEG in June 2019 which was abnormal, showing occasional left mid-temporal epileptiform discharges during sleep, typical events were not captured.    PAST MEDICAL HISTORY: Past Medical History:  Diagnosis Date  . Acute encephalopathy   . Allergy   . Anemia   . Anxiety   . Arthritis   . Asthma   . Blood transfusion without reported diagnosis   . Bronchitis   . Bursitis   . Cervical cancer ([place])   . Chronic constipation   . Chronic hepatitis C without hepatic coma ([place]) 10/17/2014   11/10/2015 visit with Dr. Linus Salmons. Repeat labs revealed resolution of infection following treatment.   [place], [place], FNP-C; last seen 01/2014 Genotype 1b Diagnosed 01/2013  . Colon polyps   . Fibrocystic breast disease   . Galactorrhea on right side   . GERD (gastroesophageal reflux disease)   . Hepatitis C   . Hyperlipidemia   . Hypertension   . Insomnia   . Osteoporosis   . Respiratory failure ([place]) 03/20/2017  . Seizures ([place])     MEDICATIONS: Current Outpatient Medications on File Prior to Visit  Medication Sig Dispense Refill  . albuterol (PROVENTIL HFA;VENTOLIN HFA) 108 (90 Base) MCG/ACT inhaler Inhale 2 puffs into the lungs every 6 (six) hours as needed for wheezing. 1 Inhaler 1  . amLODipine (NORVASC) 5 MG tablet Take by mouth.    Marland Kitchen atorvastatin (LIPITOR) 10 MG tablet Take 1 tablet (10 mg total) by mouth daily. 90 tablet 3  . ergocalciferol (VITAMIN D2) 1.25 MG (50000 UT) capsule TAKE 1 CAPSULE BY MOUTH ONCE A WEEK FOR 12 WEEKS    . hydrochlorothiazide (HYDRODIURIL) 25 MG tablet Take by mouth.    Marland Kitchen HYDROcodone-acetaminophen (NORCO) 10-325 MG tablet Take by mouth.    Marland Kitchen  hydrOXYzine (ATARAX/VISTARIL) 10 MG tablet Take by mouth.    . irbesartan (AVAPRO) 300 MG tablet Take 300 mg by mouth daily.    Marland Kitchen ketoconazole (NIZORAL) 2 % cream Apply topically.    . levETIRAcetam (KEPPRA) 500 MG tablet TAKE 1 TABLET BY MOUTH TWICE A DAY 120 tablet 0  . losartan (COZAAR) 100 MG tablet     . pantoprazole (PROTONIX) 40 MG tablet TAKE 1 TABLET BY MOUTH DAILY AT 10 PM. 30 tablet 0  . QUEtiapine (SEROQUEL) 300 MG tablet Take 1 tablet (300 mg total) by mouth at bedtime. 90 tablet 3   No current facility-administered medications on file prior to visit.    ALLERGIES: Allergies  Allergen Reactions  . Molds & Smuts Other (See Comments)    Per allergy test  . Pollen Extract Other (See Comments)    Per allergy test  . Aspirin Nausea And Vomiting and Rash    FAMILY HISTORY: Family History  Problem Relation Age of Onset  . Asthma Mother   .  Colon cancer Sister 72  . Stroke Brother 50  . Stroke Sister 39  . Cancer Sister 109       lung cancer, +TOBACCO  . Arthritis Daughter   . Pulmonary embolism Daughter 16       on chronic warfarin  . Hypertension Son 3  . Esophageal cancer Neg Hx   . Rectal cancer Neg Hx   . Stomach cancer Neg Hx     SOCIAL HISTORY: Social History   Socioeconomic History  . Marital status: Single    Spouse name: n/a  . Number of children: 3  . Years of education: 14+  . Highest education level: Not on file  Occupational History  . Occupation: retired    Comment: phlebotomy, EKG tech; taught both  Tobacco Use  . Smoking status: Current Every Day Smoker    Packs/day: 1.00    Years: 40.00    Pack years: 40.00    Types: Cigarettes  . Smokeless tobacco: Never Used  . Tobacco comment: I'm scared of e-cigarettes, interested in patches, tobacco info given 01/10/15  Vaping Use  . Vaping Use: Never used  Substance and Sexual Activity  . Alcohol use: Yes    Alcohol/week: 14.0 - 18.0 standard drinks    Types: 14 - 18 Standard drinks or equivalent per week    Comment: I love my brandy, I'm gonna drink a couple of drinks each evening, it helps me sleep  . Drug use: No  . Sexual activity: Not Currently  Other Topics Concern  . Not on file  Social History Narrative   Raised in [place], [place].   Moved to [place], [place] and then came to [place] in 2013.   Lives alone.   Daughter lives around the corner.    Raised her niece as her own daughter, and she lives nearby.   Son lives in [place].   Right handed   Social Determinants of Health   Financial Resource Strain:   . Difficulty of Paying Living Expenses: Not on file  Food Insecurity:   . Worried About Charity fundraiser in the Last Year: Not on file  . Ran Out of Food in the Last Year: Not on file  Transportation Needs:   . Lack of Transportation (Medical): Not on file  . Lack of Transportation (Non-Medical): Not on file  Physical  Activity:   . Days of Exercise per Week: Not on file  . Minutes of Exercise  per Session: Not on file  Stress:   . Feeling of Stress : Not on file  Social Connections:   . Frequency of Communication with Friends and Family: Not on file  . Frequency of Social Gatherings with Friends and Family: Not on file  . Attends Religious Services: Not on file  . Active Member of Clubs or Organizations: Not on file  . Attends Archivist Meetings: Not on file  . Marital Status: Not on file  Intimate Partner Violence:   . Fear of Current or Ex-Partner: Not on file  . Emotionally Abused: Not on file  . Physically Abused: Not on file  . Sexually Abused: Not on file     PHYSICAL EXAM: Vitals:   01/07/20 1237  BP: 114/79  Pulse: 92  SpO2: 100%   General: No acute distress Head:  Normocephalic/atraumatic Skin/Extremities: No rash, no edema Neurological Exam: alert and oriented to person, place, and time. No aphasia or dysarthria. Fund of knowledge is appropriate.  Recent and remote memory are impaired.  Attention and concentration are reduced. MOCA score 13/30. Montreal Cognitive Assessment  01/07/2020  Visuospatial/ Executive (0/5) 1  Naming (0/3) 1  Attention: Read list of digits (0/2) 2  Attention: Read list of letters (0/1) 0  Attention: Serial 7 subtraction starting at 100 (0/3) 0  Language: Repeat phrase (0/2) 0  Language : Fluency (0/1) 0  Abstraction (0/2) 2  Delayed Recall (0/5) 1  Orientation (0/6) 6  Total 13  Adjusted Score (based on education) 13   Cranial nerves: Pupils equal, round. Extraocular movements intact with no nystagmus. Visual fields full.  No facial asymmetry.  Motor: Bulk and tone normal, muscle strength 5/5 throughout with no pronator drift. Sensation intact to cold. Reflexes +1 throughout.  Finger to nose testing intact.  Gait using rolling walker was slow, cautious due to back pain. No ataxia   IMPRESSION: This is a 74 yo RH woman with a history of f  hypertension, hyperlipidemia, hepatitis C, cervical cancer, with left temporal lobe epilepsy diagnosed after an episode of altered mental status in 2019. MRI brain at that time normal. Her 24-hour EEG was abnormal with occasional left mid-temporal epileptiform discharges. She has been seizure-free since 2019 on Levetiracetam 500mg  BID. She was lost to follow-up for 2 years and presents today for rather acute (3-4 weeks) changes with loss of appetite, weight loss, memory loss. She is also having an increase in lower back pain. Her MOCA score is 13/30. We discussed different causes of memory loss, this is not the typical history for dementia. MRI brain with and without contrast will be ordered to assess for underlying structural abnormality. Would however strongly recommend at this point further workup for weight loss/loss of appetite with PCP (?CT chest/abdomen/pelvis), she reports nausea after eating small amount of food. I discussed that symptoms are not clearly neurological, would do further systemic workup first, as well as work on improving pain control, as all these can cause cognitive changes. Offered to start mirtazapine for poor sleep, mood, and appetite, they will discuss with PCP. Continue close supervision. Follow-up in 6-8 months, they know to call for Heather changes.   Thank you for allowing me to participate in her care.  Please do not hesitate to call for Heather questions or concerns.   Ellouise Newer, M.D.   CC: [name], PA-C

## 2020-01-19 ENCOUNTER — Emergency Department (HOSPITAL_COMMUNITY)
Admission: EM | Admit: 2020-01-19 | Discharge: 2020-01-19 | Disposition: A | Discharge status: Home / Self Care | Payer: Medicare HMO | Attending: Emergency Medicine | Admitting: Emergency Medicine

## 2020-01-19 ENCOUNTER — Emergency Department (HOSPITAL_COMMUNITY): Payer: Medicare HMO

## 2020-01-19 ENCOUNTER — Encounter (HOSPITAL_COMMUNITY): Payer: Self-pay | Admitting: *Deleted

## 2020-01-19 ENCOUNTER — Other Ambulatory Visit: Payer: Self-pay

## 2020-01-19 DIAGNOSIS — R109 Unspecified abdominal pain: Secondary | ICD-10-CM | POA: Diagnosis present

## 2020-01-19 DIAGNOSIS — F17210 Nicotine dependence, cigarettes, uncomplicated: Secondary | ICD-10-CM | POA: Diagnosis not present

## 2020-01-19 DIAGNOSIS — R634 Abnormal weight loss: Secondary | ICD-10-CM | POA: Insufficient documentation

## 2020-01-19 DIAGNOSIS — K219 Gastro-esophageal reflux disease without esophagitis: Secondary | ICD-10-CM | POA: Insufficient documentation

## 2020-01-19 DIAGNOSIS — R1310 Dysphagia, unspecified: Secondary | ICD-10-CM | POA: Diagnosis not present

## 2020-01-19 DIAGNOSIS — R519 Headache, unspecified: Secondary | ICD-10-CM | POA: Diagnosis not present

## 2020-01-19 DIAGNOSIS — Z79899 Other long term (current) drug therapy: Secondary | ICD-10-CM | POA: Insufficient documentation

## 2020-01-19 DIAGNOSIS — R0981 Nasal congestion: Secondary | ICD-10-CM | POA: Diagnosis not present

## 2020-01-19 DIAGNOSIS — I10 Essential (primary) hypertension: Secondary | ICD-10-CM | POA: Insufficient documentation

## 2020-01-19 DIAGNOSIS — M549 Dorsalgia, unspecified: Secondary | ICD-10-CM | POA: Diagnosis not present

## 2020-01-19 DIAGNOSIS — R630 Anorexia: Secondary | ICD-10-CM | POA: Insufficient documentation

## 2020-01-19 DIAGNOSIS — R112 Nausea with vomiting, unspecified: Secondary | ICD-10-CM | POA: Diagnosis not present

## 2020-01-19 DIAGNOSIS — J45909 Unspecified asthma, uncomplicated: Secondary | ICD-10-CM | POA: Insufficient documentation

## 2020-01-19 DIAGNOSIS — Z8541 Personal history of malignant neoplasm of cervix uteri: Secondary | ICD-10-CM | POA: Diagnosis not present

## 2020-01-19 DIAGNOSIS — R059 Cough, unspecified: Secondary | ICD-10-CM | POA: Diagnosis not present

## 2020-01-19 LAB — COMPREHENSIVE METABOLIC PANEL
ALT: 20 U/L (ref 0–44)
AST: 32 U/L (ref 15–41)
Albumin: 3.6 g/dL (ref 3.5–5.0)
Alkaline Phosphatase: 62 U/L (ref 38–126)
Anion gap: 16 — ABNORMAL HIGH (ref 5–15)
BUN: 15 mg/dL (ref 8–23)
CO2: 15 mmol/L — ABNORMAL LOW (ref 22–32)
Calcium: 9.7 mg/dL (ref 8.9–10.3)
Chloride: 102 mmol/L (ref 98–111)
Creatinine, Ser: 1.43 mg/dL — ABNORMAL HIGH (ref 0.44–1.00)
GFR, Estimated: 39 mL/min — ABNORMAL LOW (ref >60)
Glucose, Bld: 104 mg/dL — ABNORMAL HIGH (ref 70–99)
Potassium: 3.4 mmol/L — ABNORMAL LOW (ref 3.5–5.1)
Sodium: 133 mmol/L — ABNORMAL LOW (ref 135–145)
Total Bilirubin: 1.1 mg/dL (ref 0.3–1.2)
Total Protein: 7.9 g/dL (ref 6.5–8.1)

## 2020-01-19 LAB — URINALYSIS, ROUTINE W REFLEX MICROSCOPIC
Bilirubin Urine: NEGATIVE
Glucose, UA: NEGATIVE mg/dL
Hgb urine dipstick: NEGATIVE
Ketones, ur: 5 mg/dL — AB
Leukocytes,Ua: NEGATIVE
Nitrite: NEGATIVE
Protein, ur: NEGATIVE mg/dL
Specific Gravity, Urine: 1.008 (ref 1.005–1.030)
pH: 7 (ref 5.0–8.0)

## 2020-01-19 LAB — CBC
HCT: 35.3 % — ABNORMAL LOW (ref 36.0–46.0)
Hemoglobin: 11.8 g/dL — ABNORMAL LOW (ref 12.0–15.0)
MCH: 32.2 pg (ref 26.0–34.0)
MCHC: 33.4 g/dL (ref 30.0–36.0)
MCV: 96.4 fL (ref 80.0–100.0)
Platelets: 323 10*3/uL (ref 150–400)
RBC: 3.66 MIL/uL — ABNORMAL LOW (ref 3.87–5.11)
RDW: 14.4 % (ref 11.5–15.5)
WBC: 7 10*3/uL (ref 4.0–10.5)
nRBC: 0.6 % — ABNORMAL HIGH (ref 0.0–0.2)

## 2020-01-19 LAB — LIPASE, BLOOD: Lipase: 103 U/L — ABNORMAL HIGH (ref 11–51)

## 2020-01-19 MED ORDER — IOHEXOL 300 MG/ML  SOLN
75.0000 mL | Freq: Once | INTRAMUSCULAR | Status: AC | PRN
  Administered 2020-01-19: 75 mL via INTRAVENOUS

## 2020-01-19 NOTE — ED Triage Notes (Signed)
Daughter states patient hasn't been able to eat since Sept, states she is loosing weigh she is being followed by her PCP however she isn't getting any better, she wants to be admitted to find out the problem. Recently treated for UTI just finished antibiotics.

## 2020-01-19 NOTE — ED Notes (Signed)
Discharge instructions provided to patient and family. Verbalized understanding. Alert and oriented. IV lock removed. Ambulated with steady gait out of ED with family.

## 2020-01-19 NOTE — Discharge Instructions (Addendum)
Follow-up with your primary care doctor and or a gastroenterologist.  Return here as needed if there are any worsening symptoms.

## 2020-01-19 NOTE — ED Provider Notes (Signed)
[facility] EMERGENCY DEPARTMENT Provider Note   CSN: [id number] Arrival date & time: 01/19/20  0945     History Chief Complaint  Patient presents with  . Abdominal Pain    Heather Horton is a 74 y.o. female.  Patient is a 74 year old female who presents with weight loss and decreased appetite.  She has a history of chronic hepatitis C, GERD, hypertension, hyperlipidemia, prior cervical cancer, temporal lobe epilepsy on Keppra, chronic left side back pain.  She reports a about a 3-week history of decreased appetite and weight loss.  She is reported to have a 17 pound weight loss on her last doctor's office visit which was October 29.  This was compared to her prior weight in May of this year.  She reports over the last few weeks she has had a decreased appetite and when she tries to eat she gags on it.  She says that she also has vomiting but it is mostly mucus.  She reports some coughing and chest congestion.  Her daughter also reports that she has had some memory issues over about the last month.  She was diagnosed on her doctor's office visit on October 29 with Klebsiella UTI and was treated with antibiotics.  Her daughter reports that her memory has improved following that treatment.  She has chronic back pain and initially was having an increase in her left side back pain.  She says it is under better control now.  However she does say that she has been generally weak which she attributes to not eating well.  She is having a more difficult time walking and is using a rolling walker.  She says that she has had several falls because of the generalized weakness.  She also reports that she has had some worsening headaches.  She does not typically have headaches.  She also reports that she has had some intermittent abdominal pains and bloating.  She denies any change in stools other than she has had decreased frequency of stools which she attributes to not eating.  No current UTI  symptoms.        Past Medical History:  Diagnosis Date  . Acute encephalopathy   . Allergy   . Anemia   . Anxiety   . Arthritis   . Asthma   . Blood transfusion without reported diagnosis   . Bronchitis   . Bursitis   . Cervical cancer ([place])   . Chronic constipation   . Chronic hepatitis C without hepatic coma ([place]) 10/17/2014   11/10/2015 visit with Dr. Linus Salmons. Repeat labs revealed resolution of infection following treatment.  Iroquois, Alice, FNP-C; last seen 01/2014 Genotype 1b Diagnosed 01/2013  . Colon polyps   . Fibrocystic breast disease   . Galactorrhea on right side   . GERD (gastroesophageal reflux disease)   . Hepatitis C   . Hyperlipidemia   . Hypertension   . Insomnia   . Osteoporosis   . Respiratory failure ([place]) 03/20/2017  . Seizures [facility])     Patient Active Problem List   Diagnosis Date Noted  . History of cervical cancer 04/29/2017  . Generalized anxiety disorder 07/22/2015  . Liver fibrosis 04/17/2015  . Family history of colon cancer 12/17/2014  . History of colonic polyps 12/17/2014  . Fibromyalgia 11/28/2014  . Osteoarthritis of multiple joints 10/17/2014  . Esophageal reflux 10/17/2014  . Benign essential HTN 10/17/2014  . Smoker 10/17/2014  . Chronic constipation   .  Fibrocystic breast disease     Past Surgical History:  Procedure Laterality Date  . ABDOMINAL HYSTERECTOMY  30's   due to cervical cancer  . APPENDECTOMY       OB History   No obstetric history on file.     Family History  Problem Relation Age of Onset  . Asthma Mother   . Colon cancer Sister 1  . Stroke Brother 36  . Stroke Sister 49  . Cancer Sister 36       lung cancer, +TOBACCO  . Arthritis Daughter   . Pulmonary embolism Daughter 39       on chronic warfarin  . Hypertension Son 36  . Esophageal cancer Neg Hx   . Rectal cancer Neg Hx   . Stomach cancer Neg Hx     Social History   Tobacco Use  .  Smoking status: Current Every Day Smoker    Packs/day: 1.00    Years: 40.00    Pack years: 40.00    Types: Cigarettes  . Smokeless tobacco: Never Used  . Tobacco comment: I'm scared of e-cigarettes, interested in patches, tobacco info given 01/10/15  Vaping Use  . Vaping Use: Never used  Substance Use Topics  . Alcohol use: Yes    Alcohol/week: 14.0 - 18.0 standard drinks    Types: 14 - 18 Standard drinks or equivalent per week    Comment: I love my brandy, I'm gonna drink a couple of drinks each evening, it helps me sleep  . Drug use: No    Home Medications Prior to Admission medications   Medication Sig Start Date End Date Taking? Authorizing Provider  albuterol (PROVENTIL HFA;VENTOLIN HFA) 108 (90 Base) MCG/ACT inhaler Inhale 2 puffs into the lungs every 6 (six) hours as needed for wheezing. 04/08/17   [name], PA  amLODipine (NORVASC) 5 MG tablet Take by mouth.    [provider]  atorvastatin (LIPITOR) 10 MG tablet Take 1 tablet (10 mg total) by mouth daily. 04/30/17   [name], PA  ergocalciferol (VITAMIN D2) 1.25 MG (50000 UT) capsule TAKE 1 CAPSULE BY MOUTH ONCE A WEEK FOR 12 WEEKS 12/28/19   [provider]  hydrochlorothiazide (HYDRODIURIL) 25 MG tablet Take by mouth. 01/13/18   [provider]  HYDROcodone-acetaminophen (NORCO) 10-325 MG tablet Take by mouth. 12/28/19 12/27/20  [provider]  hydrOXYzine (ATARAX/VISTARIL) 10 MG tablet Take by mouth. 01/13/18   [provider]  irbesartan (AVAPRO) 300 MG tablet Take 300 mg by mouth daily. 05/05/18   [provider]  ketoconazole (NIZORAL) 2 % cream Apply topically. 12/09/17   [provider]  levETIRAcetam (KEPPRA) 500 MG tablet TAKE 1 TABLET BY MOUTH TWICE A DAY 04/09/19   Cameron Sprang, MD  losartan (COZAAR) 100 MG tablet  01/13/18   [provider]  pantoprazole (PROTONIX) 40 MG tablet TAKE 1 TABLET BY MOUTH DAILY AT 10 PM. 09/22/17    Rutherford Guys, MD  QUEtiapine (SEROQUEL) 300 MG tablet Take 1 tablet (300 mg total) by mouth at bedtime. 07/29/17   [name], PA    Allergies    Molds & smuts, Pollen extract, and Aspirin  Review of Systems   Review of Systems  Constitutional: Positive for appetite change, fatigue and unexpected weight change. Negative for chills, diaphoresis and fever.  HENT: Negative for congestion, rhinorrhea and sneezing.        Mucus in her throat  Eyes: Negative.   Respiratory: Positive for cough. Negative  for chest tightness and shortness of breath.   Cardiovascular: Negative for chest pain and leg swelling.  Gastrointestinal: Positive for abdominal pain. Negative for abdominal distention, blood in stool, diarrhea, nausea and vomiting.  Genitourinary: Negative for difficulty urinating, flank pain, frequency and hematuria.  Musculoskeletal: Positive for back pain. Negative for arthralgias.  Skin: Negative for rash.  Neurological: Positive for weakness (Generalized) and headaches. Negative for dizziness, speech difficulty and numbness.    Physical Exam Updated Vital Signs BP (!) 149/100   Pulse 89   Temp 98 F (36.7 C) (Oral)   Resp 17   SpO2 100%   Physical Exam Constitutional:      Appearance: She is well-developed.  HENT:     Head: Normocephalic and atraumatic.  Eyes:     Pupils: Pupils are equal, round, and reactive to light.  Cardiovascular:     Rate and Rhythm: Normal rate and regular rhythm.     Heart sounds: Normal heart sounds.  Pulmonary:     Effort: Pulmonary effort is normal. No respiratory distress.     Breath sounds: Normal breath sounds. No wheezing or rales.  Chest:     Chest wall: No tenderness.  Abdominal:     General: Bowel sounds are normal.     Palpations: Abdomen is soft.     Tenderness: There is generalized abdominal tenderness. There is no guarding or rebound.  Musculoskeletal:        General: Normal range of motion.     Cervical back: Normal  range of motion and neck supple.     Comments: She has some tenderness to the left lumbar paraspinal area which she says is where she typically hurts in her back.  No midline tenderness.  Lymphadenopathy:     Cervical: No cervical adenopathy.  Skin:    General: Skin is warm and dry.     Findings: No rash.  Neurological:     Mental Status: She is alert and oriented to person, place, and time.     Comments: Motor 5 out of 5 all extremities, sensation grossly intact to light touch all extremities, no pronator drift, cranial nerves II through XII grossly intact     ED Results / Procedures / Treatments   Labs (all labs ordered are listed, but only abnormal results are displayed) Labs Reviewed  LIPASE, BLOOD - Abnormal; Notable for the following components:      Result Value   Lipase 103 (*)    All other components within normal limits  COMPREHENSIVE METABOLIC PANEL - Abnormal; Notable for the following components:   Sodium 133 (*)    Potassium 3.4 (*)    CO2 15 (*)    Glucose, Bld 104 (*)    Creatinine, Ser 1.43 (*)    GFR, Estimated 39 (*)    Anion gap 16 (*)    All other components within normal limits  CBC - Abnormal; Notable for the following components:   RBC 3.66 (*)    Hemoglobin 11.8 (*)    HCT 35.3 (*)    nRBC 0.6 (*)    All other components within normal limits  URINALYSIS, ROUTINE W REFLEX MICROSCOPIC - Abnormal; Notable for the following components:   Ketones, ur 5 (*)    All other components within normal limits    EKG None  Radiology DG Chest 2 View  Result Date: 01/19/2020 CLINICAL DATA:  Cough, shortness of breath, urinary tract infection, concern for sepsis EXAM: CHEST - 2 VIEW COMPARISON:  03/21/2017  FINDINGS: The heart size and mediastinal contours are within normal limits. Both lungs are clear. The visualized skeletal structures are unremarkable. Aorta atherosclerotic. Degenerative changes of the spine. IMPRESSION: No active cardiopulmonary disease.  Electronically Signed   By: Jerilynn Mages.  Shick M.D.   On: 01/19/2020 14:04   CT Head Wo Contrast  Result Date: 01/19/2020 CLINICAL DATA:  Headache EXAM: CT HEAD WITHOUT CONTRAST TECHNIQUE: Contiguous axial images were obtained from the base of the skull through the vertex without intravenous contrast. COMPARISON:  None. FINDINGS: Brain: No evidence of acute infarction, hemorrhage, hydrocephalus, extra-axial collection or mass lesion/mass effect. Vascular: Atherosclerotic calcifications involving the large vessels of the skull base. No unexpected hyperdense vessel. Skull: Normal. Negative for fracture or focal lesion. Sinuses/Orbits: No acute finding. Other: None. IMPRESSION: No acute intracranial findings. Electronically Signed   By: Davina Poke D.O.   On: 01/19/2020 15:59   CT Abdomen Pelvis W Contrast  Result Date: 01/19/2020 CLINICAL DATA:  Abdominal pain weight loss EXAM: CT ABDOMEN AND PELVIS WITH CONTRAST TECHNIQUE: Multidetector CT imaging of the abdomen and pelvis was performed using the standard protocol following bolus administration of intravenous contrast. CONTRAST:  27mL OMNIPAQUE IOHEXOL 300 MG/ML  SOLN COMPARISON:  None. FINDINGS: Lower chest: The visualized heart size within normal limits. No pericardial fluid/thickening. No hiatal hernia. The visualized portions of the lungs are clear. Hepatobiliary: There is diffuse low density seen throughout the liver parenchyma.The main portal vein is patent. No evidence of calcified gallstones, gallbladder wall thickening or biliary dilatation. Pancreas: Unremarkable. No pancreatic ductal dilatation or surrounding inflammatory changes. Spleen: Normal in size without focal abnormality. Adrenals/Urinary Tract: Both adrenal glands appear normal. The kidneys and collecting system appear normal without evidence of urinary tract calculus or hydronephrosis. Bladder is unremarkable. Stomach/Bowel: The stomach, small bowel, and colon are normal in appearance. No  inflammatory changes, wall thickening, or obstructive findings. Scattered colonic diverticula are noted. The appendix is normal. Vascular/Lymphatic: There are no enlarged mesenteric, retroperitoneal, or pelvic lymph nodes. Scattered aortic atherosclerotic calcifications are seen without aneurysmal dilatation. Reproductive: The uterus and adnexa are unremarkable. Other: No evidence of abdominal wall mass or hernia. Musculoskeletal: No acute or significant osseous findings. IMPRESSION: No acute intra-abdominal or pelvic pathology to explain the patient's symptoms. Diverticulosis without diverticulitis. Hepatic steatosis. Electronically Signed   By: Prudencio Pair M.D.   On: 01/19/2020 16:10    Procedures Procedures (including critical care time)  Medications Ordered in ED Medications  iohexol (OMNIPAQUE) 300 MG/ML solution 75 mL (75 mLs Intravenous Contrast Given 01/19/20 1524)    ED Course  I have reviewed the triage vital signs and the nursing notes.  Pertinent labs & imaging results that were available during my care of the patient were reviewed by me and considered in my medical decision making (see chart for details).    MDM Rules/Calculators/A&P                          Patient is a 74 year old female who presents with decreased appetite and weight loss.  She says that she gags when she tries to eat things.  She also seems to have a lot of mucus in her throat.  Her labs are nonconcerning.  Her creatinine is mildly elevated but similar to her prior values.  Her last value in October was 1.47.  Her hemoglobin is also similar to her prior values.  Her lipase is mildly elevated, but other LFTs are normal.  Her gallbladder appears  normal on the CT without gallstones.  Her urinalysis does not appear infected.  She had a head CT given some new headaches and this showed no acute abnormality.  CT of her abdomen pelvis showed no acute abnormality.  Chest x-ray shows no acute disease.  She is otherwise  well-appearing.  Her vital signs are stable.  She was discharged home in good condition.  Given her ongoing symptoms, I discussed with the family giving her a referral to gastroenterology.  They will call make an appointment.  I instructed them to follow-up either with her PCP or gastroenterologist.  Return precautions were given.  Final Clinical Impression(s) / ED Diagnoses Final diagnoses:  Dysphagia, unspecified type  Non-intractable vomiting with nausea, unspecified vomiting type    Rx / DC Orders ED Discharge Orders    None       Malvin Johns, MD 01/19/20 1733

## 2020-01-19 NOTE — ED Notes (Signed)
Patient transported to X-ray 

## 2020-01-22 ENCOUNTER — Telehealth: Payer: Self-pay | Admitting: Neurology

## 2020-01-22 MED ORDER — DIAZEPAM 5 MG PO TABS: ORAL_TABLET | ORAL | 0 refills | Status: DC

## 2020-01-22 NOTE — Telephone Encounter (Signed)
Spoke to pt Daughter informed her tthat Dr Delice Lesch sent in an Rx for Valium take 30 mins prior to MRI, may take second dose if needed.

## 2020-01-22 NOTE — Telephone Encounter (Signed)
Pls let them know I sent in Rx for Valium take 30 mins prior to MRI, may take second dose if needed. Thanks

## 2020-01-22 NOTE — Telephone Encounter (Signed)
Patient's daughter called in stating the patient has an MRI on 01/27/20. She would like to see if some medication could be called in to help keep the patient calm during that MRI?

## 2020-01-26 ENCOUNTER — Inpatient Hospital Stay (HOSPITAL_COMMUNITY)
Admission: EM | Admit: 2020-01-26 | Discharge: 2020-02-04 | DRG: 392 | Disposition: A | Discharge status: Home Health | Payer: Medicare HMO | Attending: Internal Medicine | Admitting: Internal Medicine

## 2020-01-26 ENCOUNTER — Emergency Department (HOSPITAL_COMMUNITY): Payer: Medicare HMO

## 2020-01-26 ENCOUNTER — Other Ambulatory Visit: Payer: Self-pay

## 2020-01-26 DIAGNOSIS — J45909 Unspecified asthma, uncomplicated: Secondary | ICD-10-CM | POA: Diagnosis present

## 2020-01-26 DIAGNOSIS — E860 Dehydration: Secondary | ICD-10-CM | POA: Diagnosis not present

## 2020-01-26 DIAGNOSIS — Z9049 Acquired absence of other specified parts of digestive tract: Secondary | ICD-10-CM

## 2020-01-26 DIAGNOSIS — Z91048 Other nonmedicinal substance allergy status: Secondary | ICD-10-CM

## 2020-01-26 DIAGNOSIS — N1831 Chronic kidney disease, stage 3a: Secondary | ICD-10-CM | POA: Diagnosis present

## 2020-01-26 DIAGNOSIS — F411 Generalized anxiety disorder: Secondary | ICD-10-CM | POA: Diagnosis present

## 2020-01-26 DIAGNOSIS — M810 Age-related osteoporosis without current pathological fracture: Secondary | ICD-10-CM | POA: Diagnosis present

## 2020-01-26 DIAGNOSIS — N6019 Diffuse cystic mastopathy of unspecified breast: Secondary | ICD-10-CM | POA: Diagnosis present

## 2020-01-26 DIAGNOSIS — F0390 Unspecified dementia without behavioral disturbance: Secondary | ICD-10-CM | POA: Diagnosis present

## 2020-01-26 DIAGNOSIS — N179 Acute kidney failure, unspecified: Secondary | ICD-10-CM | POA: Diagnosis present

## 2020-01-26 DIAGNOSIS — I129 Hypertensive chronic kidney disease with stage 1 through stage 4 chronic kidney disease, or unspecified chronic kidney disease: Secondary | ICD-10-CM | POA: Diagnosis present

## 2020-01-26 DIAGNOSIS — Z79899 Other long term (current) drug therapy: Secondary | ICD-10-CM

## 2020-01-26 DIAGNOSIS — E876 Hypokalemia: Secondary | ICD-10-CM | POA: Diagnosis present

## 2020-01-26 DIAGNOSIS — K5909 Other constipation: Secondary | ICD-10-CM | POA: Diagnosis present

## 2020-01-26 DIAGNOSIS — B9681 Helicobacter pylori [H. pylori] as the cause of diseases classified elsewhere: Secondary | ICD-10-CM | POA: Diagnosis present

## 2020-01-26 DIAGNOSIS — Z886 Allergy status to analgesic agent status: Secondary | ICD-10-CM

## 2020-01-26 DIAGNOSIS — K219 Gastro-esophageal reflux disease without esophagitis: Secondary | ICD-10-CM | POA: Diagnosis present

## 2020-01-26 DIAGNOSIS — R1319 Other dysphagia: Secondary | ICD-10-CM | POA: Diagnosis present

## 2020-01-26 DIAGNOSIS — G8929 Other chronic pain: Secondary | ICD-10-CM | POA: Diagnosis present

## 2020-01-26 DIAGNOSIS — K222 Esophageal obstruction: Secondary | ICD-10-CM | POA: Diagnosis present

## 2020-01-26 DIAGNOSIS — I951 Orthostatic hypotension: Secondary | ICD-10-CM | POA: Diagnosis present

## 2020-01-26 DIAGNOSIS — K449 Diaphragmatic hernia without obstruction or gangrene: Secondary | ICD-10-CM | POA: Diagnosis present

## 2020-01-26 DIAGNOSIS — E871 Hypo-osmolality and hyponatremia: Secondary | ICD-10-CM | POA: Diagnosis present

## 2020-01-26 DIAGNOSIS — Z6829 Body mass index (BMI) 29.0-29.9, adult: Secondary | ICD-10-CM

## 2020-01-26 DIAGNOSIS — Z90710 Acquired absence of both cervix and uterus: Secondary | ICD-10-CM

## 2020-01-26 DIAGNOSIS — Z8541 Personal history of malignant neoplasm of cervix uteri: Secondary | ICD-10-CM

## 2020-01-26 DIAGNOSIS — R1310 Dysphagia, unspecified: Secondary | ICD-10-CM

## 2020-01-26 DIAGNOSIS — Z86010 Personal history of colonic polyps: Secondary | ICD-10-CM

## 2020-01-26 DIAGNOSIS — E785 Hyperlipidemia, unspecified: Secondary | ICD-10-CM | POA: Diagnosis present

## 2020-01-26 DIAGNOSIS — M1990 Unspecified osteoarthritis, unspecified site: Secondary | ICD-10-CM | POA: Diagnosis present

## 2020-01-26 DIAGNOSIS — R634 Abnormal weight loss: Secondary | ICD-10-CM

## 2020-01-26 DIAGNOSIS — G40109 Localization-related (focal) (partial) symptomatic epilepsy and epileptic syndromes with simple partial seizures, not intractable, without status epilepticus: Secondary | ICD-10-CM

## 2020-01-26 DIAGNOSIS — R296 Repeated falls: Secondary | ICD-10-CM | POA: Diagnosis present

## 2020-01-26 DIAGNOSIS — F419 Anxiety disorder, unspecified: Secondary | ICD-10-CM | POA: Diagnosis present

## 2020-01-26 DIAGNOSIS — E440 Moderate protein-calorie malnutrition: Secondary | ICD-10-CM | POA: Diagnosis present

## 2020-01-26 DIAGNOSIS — M797 Fibromyalgia: Secondary | ICD-10-CM | POA: Diagnosis present

## 2020-01-26 DIAGNOSIS — B182 Chronic viral hepatitis C: Secondary | ICD-10-CM | POA: Diagnosis present

## 2020-01-26 DIAGNOSIS — G40909 Epilepsy, unspecified, not intractable, without status epilepticus: Secondary | ICD-10-CM | POA: Diagnosis present

## 2020-01-26 DIAGNOSIS — K2970 Gastritis, unspecified, without bleeding: Secondary | ICD-10-CM

## 2020-01-26 DIAGNOSIS — I700 Atherosclerosis of aorta: Secondary | ICD-10-CM | POA: Diagnosis present

## 2020-01-26 DIAGNOSIS — R441 Visual hallucinations: Secondary | ICD-10-CM | POA: Diagnosis present

## 2020-01-26 DIAGNOSIS — D649 Anemia, unspecified: Secondary | ICD-10-CM | POA: Diagnosis present

## 2020-01-26 DIAGNOSIS — K2950 Unspecified chronic gastritis without bleeding: Secondary | ICD-10-CM | POA: Diagnosis present

## 2020-01-26 DIAGNOSIS — Z20822 Contact with and (suspected) exposure to covid-19: Secondary | ICD-10-CM | POA: Diagnosis present

## 2020-01-26 DIAGNOSIS — R1314 Dysphagia, pharyngoesophageal phase: Principal | ICD-10-CM | POA: Diagnosis present

## 2020-01-26 DIAGNOSIS — F17210 Nicotine dependence, cigarettes, uncomplicated: Secondary | ICD-10-CM | POA: Diagnosis present

## 2020-01-26 DIAGNOSIS — Z825 Family history of asthma and other chronic lower respiratory diseases: Secondary | ICD-10-CM

## 2020-01-26 DIAGNOSIS — Z8249 Family history of ischemic heart disease and other diseases of the circulatory system: Secondary | ICD-10-CM

## 2020-01-26 DIAGNOSIS — M5440 Lumbago with sciatica, unspecified side: Secondary | ICD-10-CM | POA: Diagnosis present

## 2020-01-26 LAB — COMPREHENSIVE METABOLIC PANEL
ALT: 18 U/L (ref 0–44)
AST: 27 U/L (ref 15–41)
Albumin: 3.2 g/dL — ABNORMAL LOW (ref 3.5–5.0)
Alkaline Phosphatase: 68 U/L (ref 38–126)
Anion gap: 14 (ref 5–15)
BUN: 22 mg/dL (ref 8–23)
CO2: 19 mmol/L — ABNORMAL LOW (ref 22–32)
Calcium: 9.2 mg/dL (ref 8.9–10.3)
Chloride: 100 mmol/L (ref 98–111)
Creatinine, Ser: 2.93 mg/dL — ABNORMAL HIGH (ref 0.44–1.00)
GFR, Estimated: 16 mL/min — ABNORMAL LOW (ref >60)
Glucose, Bld: 129 mg/dL — ABNORMAL HIGH (ref 70–99)
Potassium: 3.3 mmol/L — ABNORMAL LOW (ref 3.5–5.1)
Sodium: 133 mmol/L — ABNORMAL LOW (ref 135–145)
Total Bilirubin: 0.5 mg/dL (ref 0.3–1.2)
Total Protein: 7 g/dL (ref 6.5–8.1)

## 2020-01-26 LAB — CBC WITH DIFFERENTIAL/PLATELET
Abs Immature Granulocytes: 0.37 10*3/uL — ABNORMAL HIGH (ref 0.00–0.07)
Basophils Absolute: 0.1 10*3/uL (ref 0.0–0.1)
Basophils Relative: 1 %
Eosinophils Absolute: 0.1 10*3/uL (ref 0.0–0.5)
Eosinophils Relative: 1 %
HCT: 30.8 % — ABNORMAL LOW (ref 36.0–46.0)
Hemoglobin: 10.2 g/dL — ABNORMAL LOW (ref 12.0–15.0)
Immature Granulocytes: 3 %
Lymphocytes Relative: 19 %
Lymphs Abs: 2.1 10*3/uL (ref 0.7–4.0)
MCH: 31.8 pg (ref 26.0–34.0)
MCHC: 33.1 g/dL (ref 30.0–36.0)
MCV: 96 fL (ref 80.0–100.0)
Monocytes Absolute: 1 10*3/uL (ref 0.1–1.0)
Monocytes Relative: 9 %
Neutro Abs: 7.5 10*3/uL (ref 1.7–7.7)
Neutrophils Relative %: 67 %
Platelets: 271 10*3/uL (ref 150–400)
RBC: 3.21 MIL/uL — ABNORMAL LOW (ref 3.87–5.11)
RDW: 14.4 % (ref 11.5–15.5)
WBC: 11.1 10*3/uL — ABNORMAL HIGH (ref 4.0–10.5)
nRBC: 0.7 % — ABNORMAL HIGH (ref 0.0–0.2)

## 2020-01-26 LAB — URINALYSIS, ROUTINE W REFLEX MICROSCOPIC
Bilirubin Urine: NEGATIVE
Glucose, UA: NEGATIVE mg/dL
Hgb urine dipstick: NEGATIVE
Ketones, ur: 5 mg/dL — AB
Leukocytes,Ua: NEGATIVE
Nitrite: NEGATIVE
Protein, ur: NEGATIVE mg/dL
Specific Gravity, Urine: 1.013 (ref 1.005–1.030)
pH: 6 (ref 5.0–8.0)

## 2020-01-26 LAB — I-STAT VENOUS BLOOD GAS, ED
Acid-base deficit: 2 mmol/L (ref 0.0–2.0)
Bicarbonate: 23 mmol/L (ref 20.0–28.0)
Calcium, Ion: 1.12 mmol/L — ABNORMAL LOW (ref 1.15–1.40)
HCT: 35 % — ABNORMAL LOW (ref 36.0–46.0)
Hemoglobin: 11.9 g/dL — ABNORMAL LOW (ref 12.0–15.0)
O2 Saturation: 92 %
Potassium: 3.6 mmol/L (ref 3.5–5.1)
Sodium: 135 mmol/L (ref 135–145)
TCO2: 24 mmol/L (ref 22–32)
pCO2, Ven: 37.1 mmHg — ABNORMAL LOW (ref 44.0–60.0)
pH, Ven: 7.399 (ref 7.250–7.430)
pO2, Ven: 63 mmHg — ABNORMAL HIGH (ref 32.0–45.0)

## 2020-01-26 LAB — POC OCCULT BLOOD, ED: Fecal Occult Bld: NEGATIVE

## 2020-01-26 LAB — RESP PANEL BY RT-PCR (FLU A&B, COVID) ARPGX2
Influenza A by PCR: NEGATIVE
Influenza B by PCR: NEGATIVE
SARS Coronavirus 2 by RT PCR: NEGATIVE

## 2020-01-26 MED ORDER — LEVETIRACETAM 500 MG PO TABS
500.0000 mg | ORAL_TABLET | Freq: Two times a day (BID) | ORAL | Status: DC
  Administered 2020-01-26 – 2020-02-04 (×18): 500 mg via ORAL
  Filled 2020-01-26 (×19): qty 1

## 2020-01-26 MED ORDER — PANTOPRAZOLE SODIUM 40 MG PO TBEC
40.0000 mg | DELAYED_RELEASE_TABLET | Freq: Every day | ORAL | Status: DC
  Administered 2020-01-26 – 2020-01-27 (×2): 40 mg via ORAL
  Filled 2020-01-26 (×2): qty 1

## 2020-01-26 MED ORDER — ACETAMINOPHEN 650 MG RE SUPP: 650.0000 mg | Freq: Four times a day (QID) | RECTAL | Status: DC | PRN

## 2020-01-26 MED ORDER — SODIUM CHLORIDE 0.9 % IV BOLUS
500.0000 mL | Freq: Once | INTRAVENOUS | Status: AC
  Administered 2020-01-26: 500 mL via INTRAVENOUS

## 2020-01-26 MED ORDER — ACETAMINOPHEN 325 MG PO TABS
650.0000 mg | ORAL_TABLET | Freq: Four times a day (QID) | ORAL | Status: DC | PRN
  Administered 2020-01-27 – 2020-02-04 (×8): 650 mg via ORAL
  Filled 2020-01-26 (×9): qty 2

## 2020-01-26 MED ORDER — HEPARIN SODIUM (PORCINE) 5000 UNIT/ML IJ SOLN
5000.0000 [IU] | Freq: Three times a day (TID) | INTRAMUSCULAR | Status: DC
  Administered 2020-01-26 – 2020-01-29 (×6): 5000 [IU] via SUBCUTANEOUS
  Filled 2020-01-26 (×6): qty 1

## 2020-01-26 MED ORDER — MIRTAZAPINE 15 MG PO TABS
7.5000 mg | ORAL_TABLET | Freq: Every day | ORAL | Status: DC
  Administered 2020-01-26 – 2020-02-03 (×9): 7.5 mg via ORAL
  Filled 2020-01-26 (×9): qty 1

## 2020-01-26 MED ORDER — SODIUM CHLORIDE 0.9 % IV BOLUS
1000.0000 mL | Freq: Once | INTRAVENOUS | Status: AC
  Administered 2020-01-26: 1000 mL via INTRAVENOUS

## 2020-01-26 MED ORDER — QUETIAPINE FUMARATE 100 MG PO TABS
300.0000 mg | ORAL_TABLET | Freq: Every day | ORAL | Status: DC
  Administered 2020-01-26 – 2020-02-03 (×9): 300 mg via ORAL
  Filled 2020-01-26 (×8): qty 3
  Filled 2020-01-26: qty 1
  Filled 2020-01-26: qty 3

## 2020-01-26 NOTE — H&P (Signed)
Date: 01/26/2020               Patient Name:  Heather Horton MRN: [id number]  DOB: June 25, 1945 Age / Sex: 74 y.o., female   PCP: [facility] Service: Internal Medicine Teaching Service         Attending Physician: Dr. Rebeca Alert, Raynaldo Opitz, MD    First Contact: Dr. Foy Guadalajara, MD Pager: [phone]  Second Contact: Dr. Mitzi Hansen, MD Pager: [phone]       After Hours (After 5p/  First Contact Pager: [phone]  weekends / holidays): Second Contact Pager: [phone]   Chief Complaint: weakness, weight loss  History of Present Illness:   Heather Horton is a 74 year old female with history of acute encephalopathy, cervical cancer s/p hysterectomy, chronic constipation, chronic hepatitis C, GERD, seizure, and chronic back pain on chronic opiate therapy presenting for weakness and loss of appetite with associated weight loss. Patient's daughter was present and provided the majority of HPI.  Patient's daughter reports that the patient has not eaten well for the past few months, stating that her last full meal was on patient's birthday in [birth year]. States that the patient has been having significant weakness at home. Patient reports that whenever she tries to stand up, her legs begin to wobble, preventing her from being able to move around despite using a walker at home. Patient's daughter reports that her and her sister were trying to help the patient showed earlier today when patient's legs gave out, but her and her sister were able to hold her up so the patient did not fall. However, she reports that the patient has had several recent falls, including one where she hit her head and had a small bump on her head. Daughter states that they went to go see patient's PCP who scheduled for the patient to have an MRI tomorrow.  Daughter also reports that patient has been having significant weight loss over the past few months because she has no appetite at home. States that  she has to force the patient to have some food and even then it is mainly a few spoons of yogurt, jello, or ensure. States that when the patient is eating, she is able to chew fine but she takes a long time to chew and needs to be reminded to swallow her food. Patient reports that she just does not want to eat and that she just is not hungry. She also reports that she sometimes chokes on her food and has vomited on occasion (happened once yesterday). As per daughter, the patient does not drink much water, maybe drinking 2 glasses of water per day. Patient has not been able to pass bowel movements regularly, but her last one was yesterday and was loose but no blood or dark stools noted. Patient urinates a few times per day, but daughter mentions that she does not seem to go that often.  Daughter states that patient has been having a lot of difficulty with memory and that it is gradually worsening. On chart review, patient saw neurology on 11/8 and had a MOCHA score of 13/30.   Also on chart review, patient had a TSH level of 1.9 about a month ago.  Patient lives alone, but her daughter stays with her after work until patient goes to sleep.  ED Course: CBC showing WBC 11.1, Hgb 10.2. CMP showing Na 133, K 3.3, Glucose 129, Cr 2.93,  BUN 22. Urinalysis negative. VBG showing pH 7.39, pCO2 37.1. COVID-19 negative. FOBT negative.   Meds:  Current Meds  Medication Sig  . albuterol (PROVENTIL HFA;VENTOLIN HFA) 108 (90 Base) MCG/ACT inhaler Inhale 2 puffs into the lungs every 6 (six) hours as needed for wheezing.  Marland Kitchen atorvastatin (LIPITOR) 10 MG tablet Take 1 tablet (10 mg total) by mouth daily.  . ergocalciferol (VITAMIN D2) 1.25 MG (50000 UT) capsule Take 50,000 Units by mouth every Wednesday.   . hydrochlorothiazide (HYDRODIURIL) 25 MG tablet Take 25 mg by mouth daily.   Marland Kitchen HYDROcodone-acetaminophen (NORCO) 10-325 MG tablet Take 0.5-1 tablets by mouth in the morning and at bedtime.   . hydrOXYzine  (ATARAX/VISTARIL) 10 MG tablet Take 10 mg by mouth daily as needed for anxiety.   Marland Kitchen ketoconazole (NIZORAL) 2 % cream Apply 1 application topically as needed for irritation.   . levETIRAcetam (KEPPRA) 500 MG tablet TAKE 1 TABLET BY MOUTH TWICE A DAY (Patient taking differently: Take 500 mg by mouth 2 (two) times daily. )  . loratadine (CLARITIN) 10 MG tablet Take 10 mg by mouth daily as needed for allergies or rhinitis.  Marland Kitchen losartan (COZAAR) 100 MG tablet Take 100 mg by mouth daily.   . mirtazapine (REMERON) 7.5 MG tablet Take 7.5 mg by mouth at bedtime.   . pantoprazole (PROTONIX) 40 MG tablet TAKE 1 TABLET BY MOUTH DAILY AT 10 PM. (Patient taking differently: Take 40 mg by mouth at bedtime. )  . QUEtiapine (SEROQUEL) 300 MG tablet Take 1 tablet (300 mg total) by mouth at bedtime.  Marland Kitchen tetrahydrozoline-zinc (VISINE-AC) 0.05-0.25 % ophthalmic solution Place 1-2 drops into both eyes 3 (three) times daily as needed (for irritation).     Allergies: Allergies as of 01/26/2020 - Review Complete 01/26/2020  Allergen Reaction Noted  . Molds & smuts Other (See Comments) 04/17/2015  . Pollen extract Other (See Comments) 04/17/2015  . Aspirin Nausea And Vomiting and Rash 11/19/2011   Past Medical History:  Diagnosis Date  . Acute encephalopathy   . Allergy   . Anemia   . Anxiety   . Arthritis   . Asthma   . Blood transfusion without reported diagnosis   . Bronchitis   . Bursitis   . Cervical cancer ([place])   . Chronic constipation   . Chronic hepatitis C without hepatic coma ([place]) 10/17/2014   11/10/2015 visit with Dr. Linus Salmons. Repeat labs revealed resolution of infection following treatment.  [place], [place], FNP-C; last seen 01/2014 Genotype 1b Diagnosed 01/2013  . Colon polyps   . Fibrocystic breast disease   . Galactorrhea on right side   . GERD (gastroesophageal reflux disease)   . Hepatitis C   . Hyperlipidemia   . Hypertension   . Insomnia     . Osteoporosis   . Respiratory failure ([place]) 03/20/2017  . Seizures ([place])     Family History:  Family History  Problem Relation Age of Onset  . Asthma Mother   . Colon cancer Sister 66  . Stroke Brother 33  . Stroke Sister 22  . Cancer Sister 12       lung cancer, +TOBACCO  . Arthritis Daughter   . Pulmonary embolism Daughter 25       on chronic warfarin  . Hypertension Son 53  . Esophageal cancer Neg Hx   . Rectal cancer Neg Hx   . Stomach cancer Neg Hx     Social History:  Social History  Socioeconomic History  . Marital status: Single    Spouse name: n/a  . Number of children: 3  . Years of education: 14+  . Highest education level: Not on file  Occupational History  . Occupation: retired    Comment: phlebotomy, EKG tech; taught both  Tobacco Use  . Smoking status: Current Every Day Smoker    Packs/day: 1.00    Years: 40.00    Pack years: 40.00    Types: Cigarettes  . Smokeless tobacco: Never Used  . Tobacco comment: I'm scared of e-cigarettes, interested in patches, tobacco info given 01/10/15  Vaping Use  . Vaping Use: Never used  Substance and Sexual Activity  . Alcohol use: Yes    Alcohol/week: 14.0 - 18.0 standard drinks    Types: 14 - 18 Standard drinks or equivalent per week    Comment: I love my brandy, I'm gonna drink a couple of drinks each evening, it helps me sleep  . Drug use: No  . Sexual activity: Not Currently  Other Topics Concern  . Not on file  Social History Narrative   Raised in [place], [place].   Moved to [place], [place] and then came to [place] in 2013.   Lives alone.   Daughter lives around the corner.    Raised her niece as her own daughter, and she lives nearby.   Son lives in [place].   Right handed   Social Determinants of Health   Financial Resource Strain:   . Difficulty of Paying Living Expenses: Not on file  Food Insecurity:   . Worried About Charity fundraiser in the Last Year: Not on file  . Ran Out of Food in  the Last Year: Not on file  Transportation Needs:   . Lack of Transportation (Medical): Not on file  . Lack of Transportation (Non-Medical): Not on file  Physical Activity:   . Days of Exercise per Week: Not on file  . Minutes of Exercise per Session: Not on file  Stress:   . Feeling of Stress : Not on file  Social Connections:   . Frequency of Communication with Friends and Family: Not on file  . Frequency of Social Gatherings with Friends and Family: Not on file  . Attends Religious Services: Not on file  . Active Member of Clubs or Organizations: Not on file  . Attends Archivist Meetings: Not on file  . Marital Status: Not on file  Intimate Partner Violence:   . Fear of Current or Ex-Partner: Not on file  . Emotionally Abused: Not on file  . Physically Abused: Not on file  . Sexually Abused: Not on file    Review of Systems: A complete ROS was negative except as per HPI.   + for weakness, loss of appetite, weight loss  - fevers, chills, nausea, dizziness, dysphagia, odynophagia, hematochezia, melena, dysuria  Physical Exam: Blood pressure 125/74, pulse 96, temperature 97.8 F (36.6 C), temperature source Oral, resp. rate 18, height 5\' 5"  (1.651 m), weight 82.6 kg, SpO2 100 %. Physical Exam Constitutional:      Appearance: Normal appearance.     Comments: Frail-appearing, elderly female lying in bed, NAD.  HENT:     Head: Normocephalic.     Comments: Small contusion on right temporal region. Not actively bleeding, but is painful to touch.    Mouth/Throat:     Mouth: Mucous membranes are dry.     Pharynx: Oropharynx is clear.  Eyes:  General: No scleral icterus.    Extraocular Movements: Extraocular movements intact.     Conjunctiva/sclera: Conjunctivae normal.     Pupils: Pupils are equal, round, and reactive to light.  Cardiovascular:     Rate and Rhythm: Normal rate and regular rhythm.     Pulses: Normal pulses.     Heart sounds: Normal heart  sounds. No murmur heard.  No friction rub. No gallop.   Pulmonary:     Effort: Pulmonary effort is normal.     Breath sounds: Normal breath sounds. No stridor. No wheezing, rhonchi or rales.  Abdominal:     Palpations: Abdomen is soft. There is no mass.     Tenderness: There is no guarding or rebound.     Comments: Soft, nondistended abdomen. Hypoactive bowel sounds. TTP in RMQ, RLQ, and hypogastric regions.  Musculoskeletal:        General: No swelling. Normal range of motion.     Cervical back: Normal range of motion.  Skin:    General: Skin is warm and dry.  Neurological:     General: No focal deficit present.     Mental Status: She is alert.     Motor: Weakness present.     Comments: Oriented to person and place, but not to time.  Psychiatric:        Mood and Affect: Mood normal.        Behavior: Behavior normal.        Thought Content: Thought content normal.      EKG EKG Interpretation  Date/Time:                  Saturday January 26 2020 17:00:30 EST Ventricular Rate:         96 PR Interval:                   QRS Duration: 61 QT Interval:                 391 QTC Calculation:        495 R Axis:                         85 Text Interpretation:      Sinus rhythm Borderline right axis deviation Low voltage, precordial leads Borderline T abnormalities, anterior leads ST elevation, consider inferior injury Borderline prolonged QT interval Since last tracing QT has lengthened Otherwise no significant change Confirmed by Daleen Bo [phone]) on 01/26/2020 5:22:53 PM  CT Head Wo Contrast  Result Date: 01/26/2020 CLINICAL DATA:  Weakness and brief periods of unresponsiveness. Chronic neck pain. EXAM: CT HEAD WITHOUT CONTRAST CT CERVICAL SPINE WITHOUT CONTRAST TECHNIQUE: Multidetector CT imaging of the head and cervical spine was performed following the standard protocol without intravenous contrast. Multiplanar CT image reconstructions of the cervical spine were also  generated. COMPARISON:  03/20/2017, 01/19/2020 FINDINGS: CT HEAD FINDINGS Brain: No evidence of acute infarction, hemorrhage, hydrocephalus, extra-axial collection or mass lesion/mass effect. Vascular: Atherosclerotic calcifications involving the large vessels of the skull base. No unexpected hyperdense vessel. Skull: Normal. Negative for fracture or focal lesion. Sinuses/Orbits: No acute finding. Other: None. CT CERVICAL SPINE FINDINGS Alignment: Facet joints are aligned without dislocation or traumatic listhesis. Dens and lateral masses are aligned. Skull base and vertebrae: No acute fracture. No primary bone lesion or focal pathologic process. Soft tissues and spinal canal: No prevertebral fluid or swelling. No visible canal hematoma. Disc levels: Degenerative disc disease most pronounced at  C5-6 and C6-7. Disc protrusions are evident at multiple levels. Mild multilevel bilateral facet arthropathy most pronounced at C7-T1. Prominent right-sided uncovertebral spurring C5-6. Overall, no appreciable interval change from prior. Upper chest: Mild paraseptal emphysema within the visualized right lung apex. Other: Bilateral carotid atherosclerosis. IMPRESSION: 1. No acute intracranial findings. 2. No acute fracture or traumatic listhesis of the cervical spine. 3. Degenerative disc disease and facet arthropathy of the cervical spine, similar to prior. Electronically Signed   By: Davina Poke D.O.   On: 01/26/2020 18:51   CT Cervical Spine Wo Contrast  Result Date: 01/26/2020 CLINICAL DATA:  Weakness and brief periods of unresponsiveness. Chronic neck pain. EXAM: CT HEAD WITHOUT CONTRAST CT CERVICAL SPINE WITHOUT CONTRAST TECHNIQUE: Multidetector CT imaging of the head and cervical spine was performed following the standard protocol without intravenous contrast. Multiplanar CT image reconstructions of the cervical spine were also generated. COMPARISON:  03/20/2017, 01/19/2020 FINDINGS: CT HEAD FINDINGS Brain: No  evidence of acute infarction, hemorrhage, hydrocephalus, extra-axial collection or mass lesion/mass effect. Vascular: Atherosclerotic calcifications involving the large vessels of the skull base. No unexpected hyperdense vessel. Skull: Normal. Negative for fracture or focal lesion. Sinuses/Orbits: No acute finding. Other: None. CT CERVICAL SPINE FINDINGS Alignment: Facet joints are aligned without dislocation or traumatic listhesis. Dens and lateral masses are aligned. Skull base and vertebrae: No acute fracture. No primary bone lesion or focal pathologic process. Soft tissues and spinal canal: No prevertebral fluid or swelling. No visible canal hematoma. Disc levels: Degenerative disc disease most pronounced at C5-6 and C6-7. Disc protrusions are evident at multiple levels. Mild multilevel bilateral facet arthropathy most pronounced at C7-T1. Prominent right-sided uncovertebral spurring C5-6. Overall, no appreciable interval change from prior. Upper chest: Mild paraseptal emphysema within the visualized right lung apex. Other: Bilateral carotid atherosclerosis. IMPRESSION: 1. No acute intracranial findings. 2. No acute fracture or traumatic listhesis of the cervical spine. 3. Degenerative disc disease and facet arthropathy of the cervical spine, similar to prior. Electronically Signed   By: Davina Poke D.O.   On: 01/26/2020 18:51    Assessment & Plan by Problem: Active Problems:   AKI (acute kidney injury) ([place])   Dispo: Admit patient to Inpatient with expected length of stay greater than 2 midnights.  Heather Valtierra is 74 year old female with history of acute encephalopathy, cervical cancer s/p hysterectomy, chronic constipation, GERD, seizure, and chronic back pain on chronic opiate therapy presenting for weakness and loss of appetite with associated weight loss, found to have an AKI and electrolyte abnormalities.  Acute Kidney Injury Unclear baseline creatinine, appeared to be around 0.7-0.8 in  2019. Creatinine one week ago was 1.43. On admission today, Cr 2.93. AKI is likely pre-renal secondary to dehydration as patient does not seem to be drinking enough fluids at home. She is also taking hydrochlorothiazide, which could be leading to further dehydration. Orthostatics showed BP 125/78 while lying and BP 106/70 while sitting, which is consistent with dehydration. -given 565mL NS bolus in ED -will give additional 1L NS bolus -trend renal function, UOP, I/O's -avoid nephrotoxic medications  Loss of appetite Weight Loss Hx of cervical cancer s/p abdominal hysterectomy Upon chart review, patient's weight was 90.4kg in 04/2019 and decreased to 80.6 as of 01/07/2020. This is likely closely associated with patient's loss of appetite, which has also been a chronic issue. Although patient has history of GERD, I do not suspect an esophageal cause (dysmotility or mass) as patient does not report any dysphagia or odynophagia and  the patient does not have a problem with swallowing. Daughter reports that patient was placed on mirtazipine for appetite stimulation, but it did not help. She does have a past history of cervical cancer s/p abdominal hysterectomy in her 30's. There are no records in her chart in regards to follow up for this, but patient's daughter reports that patient required multiple major surgeries for this as patient's cervical cancer was recurred. Daughter, however, does state that it has been in remission for a while now. As per daughter, patient was referred to see a gastroenterologist for further evaluation, but she has not gone yet. Daughter also reports prior colonoscopy showed multiple colon polyps and that patient has a family history of colon cancer in patient's sister (diagnosed at 62). Upon chart review, patient saw Dr. Ardis Hughs ([facility]) for a colonoscopy in 2020, which only showed several diverticuli. Patient does have a history of Hepatitis C, but it has been cured and there is  no indication for follow up screening. Patient has a TSH level checked a month ago with TSH 1.9. She also had a CT abd/pel a week ago that was unremarkable as well. FOBT today negative. -SLP evaluation -PT/OT evaluation for deconditioning -consider GI evaluation for EGD due to alarm symptoms of older age and significant weight loss -will hold off on getting a repeat CT abd/pelvis as patient just had one a week ago that was unremarkable  Mild Hyponatremia Mild Hypokalemia Na 133, K 3.3 on admission today. Electrolyte abnormalities could be secondary to dehydration or from home hydrochlorothiazide. -will hold HCTZ -trend BMP -replete as needed  Moderate cognitive impairment Last MOCHA score of 13/30. She is AAOx2 on exam today, not oriented to time. Patient and daughter report that patient's memory is gradually deteriorating and is significantly impacting patient's life. Patient is taking multiple centrally acting medications including hydroxyzine prn, keppra, mirtazipine, seroquel, and norco. -resume mirtazipine, seroquel, and keppra -holding norco and hydroxyzine at this time  HTN On HCTZ and losartan at this time. -holding HCTZ due to hypokalemia -holding losartan due to mild orthostatic hypotension  HLD -on lipitor at home, will resume.  Chronic Back Pain On norco at home, dose recently increased by PCP to 10-325mg  TID but patient is taking it BID. -holding norco at this time -can use acetaminophen for pain as needed   Signed: Virl Axe, MD 01/26/2020, 10:29 PM  Pager: [phone] After 5pm on weekdays and 1pm on weekends: On Call pager: [phone]

## 2020-01-26 NOTE — ED Notes (Signed)
°   01/26/20 1813  Orthostatic Lying   BP- Lying 125/78  Pulse- Lying 103  Orthostatic Sitting  BP- Sitting 106/70  Pulse- Sitting 110  Pt did not tolerate standing BP

## 2020-01-26 NOTE — ED Provider Notes (Signed)
[facility] EMERGENCY DEPARTMENT Provider Note   CSN: [id number] Arrival date & time: 01/26/20  1612     History Chief Complaint  Patient presents with  . Weakness    Heather Horton is a 74 y.o. female.  HPI Patient presents with her daughter who gives history.  Her daughter and the patient's other daughter, were trying to get the patient up for shower around 2 PM today and her legs gave way causing her to not be able to stand.  They assisted her back to a seated position without falling.  Earlier today around 9:00 the patient was able to ambulate to the bathroom to urinate, with help from the patient's daughter.  The patient has had a gradual decline over the last 2 months with generalized weakness, and significant weight loss.  This is associated with decreased ability to eat solid foods, and occasional vomiting of the foods that she eats.  She is stooling irregularly and tried a laxative, couple of days ago and only got some "fluid out."  She has not had ongoing vomiting, fever or chills.  The patient is using Ensure, as a nutritional supplement and able to tolerate it.  The patient has fallen and injured her head, and the last day or 2.  She has had multiple other falls and apparently injured her right leg as well.  The daughter is worried about the patient's decline, and the patient has also been seen by her PCP.  Her PCP ordered an MRI of her brain, which is scheduled for tomorrow.  There have been no recent documented fevers.  The patient occasionally coughs up "white sputum."  She has not complained of chest pain.  She has ongoing chronic low back pain for which she takes pain medication.  There are no other known modifying factors.    Past Medical History:  Diagnosis Date  . Acute encephalopathy   . Allergy   . Anemia   . Anxiety   . Arthritis   . Asthma   . Blood transfusion without reported diagnosis   . Bronchitis   . Bursitis   . Cervical cancer ([place])     . Chronic constipation   . Chronic hepatitis C without hepatic coma ([place]) 10/17/2014   11/10/2015 visit with Dr. Linus Salmons. Repeat labs revealed resolution of infection following treatment.  Lemont, Curtiss, FNP-C; last seen 01/2014 Genotype 1b Diagnosed 01/2013  . Colon polyps   . Fibrocystic breast disease   . Galactorrhea on right side   . GERD (gastroesophageal reflux disease)   . Hepatitis C   . Hyperlipidemia   . Hypertension   . Insomnia   . Osteoporosis   . Respiratory failure ([place]) 03/20/2017  . Seizures [facility])     Patient Active Problem List   Diagnosis Date Noted  . History of cervical cancer 04/29/2017  . Generalized anxiety disorder 07/22/2015  . Liver fibrosis 04/17/2015  . Family history of colon cancer 12/17/2014  . History of colonic polyps 12/17/2014  . Fibromyalgia 11/28/2014  . Osteoarthritis of multiple joints 10/17/2014  . Esophageal reflux 10/17/2014  . Benign essential HTN 10/17/2014  . Smoker 10/17/2014  . Chronic constipation   . Fibrocystic breast disease     Past Surgical History:  Procedure Laterality Date  . ABDOMINAL HYSTERECTOMY  30's   due to cervical cancer  . APPENDECTOMY       OB History   No obstetric history on file.  Family History  Problem Relation Age of Onset  . Asthma Mother   . Colon cancer Sister 104  . Stroke Brother 2  . Stroke Sister 36  . Cancer Sister 13       lung cancer, +TOBACCO  . Arthritis Daughter   . Pulmonary embolism Daughter 57       on chronic warfarin  . Hypertension Son 44  . Esophageal cancer Neg Hx   . Rectal cancer Neg Hx   . Stomach cancer Neg Hx     Social History   Tobacco Use  . Smoking status: Current Every Day Smoker    Packs/day: 1.00    Years: 40.00    Pack years: 40.00    Types: Cigarettes  . Smokeless tobacco: Never Used  . Tobacco comment: I'm scared of e-cigarettes, interested in patches, tobacco info given 01/10/15  Vaping  Use  . Vaping Use: Never used  Substance Use Topics  . Alcohol use: Yes    Alcohol/week: 14.0 - 18.0 standard drinks    Types: 14 - 18 Standard drinks or equivalent per week    Comment: I love my brandy, I'm gonna drink a couple of drinks each evening, it helps me sleep  . Drug use: No    Home Medications Prior to Admission medications   Medication Sig Start Date End Date Taking? Authorizing Provider  albuterol (PROVENTIL HFA;VENTOLIN HFA) 108 (90 Base) MCG/ACT inhaler Inhale 2 puffs into the lungs every 6 (six) hours as needed for wheezing. 04/08/17  Yes Jeffery, Chelle, PA  atorvastatin (LIPITOR) 10 MG tablet Take 1 tablet (10 mg total) by mouth daily. 04/30/17  Yes [name], PA  ergocalciferol (VITAMIN D2) 1.25 MG (50000 UT) capsule Take 50,000 Units by mouth every Wednesday.  12/28/19  Yes [provider]  hydrochlorothiazide (HYDRODIURIL) 25 MG tablet Take 25 mg by mouth daily.  01/13/18  Yes [provider]  HYDROcodone-acetaminophen (NORCO) 10-325 MG tablet Take 0.5-1 tablets by mouth in the morning and at bedtime.  12/28/19 12/27/20 Yes [provider]  hydrOXYzine (ATARAX/VISTARIL) 10 MG tablet Take 10 mg by mouth daily as needed for anxiety.  01/13/18  Yes [provider]  ketoconazole (NIZORAL) 2 % cream Apply 1 application topically as needed for irritation.  12/09/17  Yes [provider]  levETIRAcetam (KEPPRA) 500 MG tablet TAKE 1 TABLET BY MOUTH TWICE A DAY Patient taking differently: Take 500 mg by mouth 2 (two) times daily.  04/09/19  Yes Cameron Sprang, MD  loratadine (CLARITIN) 10 MG tablet Take 10 mg by mouth daily as needed for allergies or rhinitis.   Yes [provider]  losartan (COZAAR) 100 MG tablet Take 100 mg by mouth daily.  01/13/18  Yes [provider]  mirtazapine (REMERON) 7.5 MG tablet Take 7.5 mg by mouth at bedtime.    Yes [provider]  pantoprazole (PROTONIX) 40 MG tablet TAKE 1  TABLET BY MOUTH DAILY AT 10 PM. Patient taking differently: Take 40 mg by mouth at bedtime.  09/22/17  Yes Rutherford Guys, MD  QUEtiapine (SEROQUEL) 300 MG tablet Take 1 tablet (300 mg total) by mouth at bedtime. 07/29/17  Yes Jeffery, Domingo Mend, PA  tetrahydrozoline-zinc (VISINE-AC) 0.05-0.25 % ophthalmic solution Place 1-2 drops into both eyes 3 (three) times daily as needed (for irritation).    Yes [provider]  diazepam (VALIUM) 5 MG tablet Take 1 tablet 30 minutes prior to MRI. May take second dose if needed. Patient taking differently:  Take 5 mg by mouth See admin instructions. Take 5 mg by mouth 30 minutes prior to MRI and may take second dose if needed. 01/22/20   Cameron Sprang, MD  folic acid (FOLVITE) 701 MCG tablet Take 800 mcg by mouth in the morning.    [provider]    Allergies    Molds & smuts, Pollen extract, and Aspirin  Review of Systems   Review of Systems  All other systems reviewed and are negative.   Physical Exam Updated Vital Signs BP 116/66   Pulse (!) 108   Temp 98.4 F (36.9 C) (Axillary)   Resp 18   Ht 5\' 5"  (1.651 m)   SpO2 96%   BMI 29.55 kg/m   Physical Exam Vitals and nursing note reviewed.  Constitutional:      General: She is not in acute distress.    Appearance: She is well-developed. She is not ill-appearing, toxic-appearing or diaphoretic.     Comments: Frail appearance  HENT:     Head: Normocephalic.     Comments: Small contusion right parietal region without crepitation or deformity.  No laceration or abrasion of the scalp.    Right Ear: External ear normal.     Left Ear: External ear normal.     Mouth/Throat:     Mouth: Mucous membranes are moist.     Pharynx: No oropharyngeal exudate or posterior oropharyngeal erythema.  Eyes:     Conjunctiva/sclera: Conjunctivae normal.     Pupils: Pupils are equal, round, and reactive to light.  Neck:     Trachea: Phonation normal.  Cardiovascular:     Rate and Rhythm:  Normal rate and regular rhythm.     Heart sounds: Normal heart sounds.  Pulmonary:     Effort: Pulmonary effort is normal.     Breath sounds: Normal breath sounds.  Abdominal:     General: There is no distension.     Palpations: Abdomen is soft.     Tenderness: There is no abdominal tenderness.  Genitourinary:    Comments: Normal anus with normal perianal sensation.  No stool in rectal vault.  Mucus sent for Hemoccult testing.  No palpable rectal mass.  No fecal impaction. Musculoskeletal:        General: Normal range of motion.     Cervical back: Normal range of motion and neck supple.  Skin:    General: Skin is warm and dry.  Neurological:     Mental Status: She is alert and oriented to person, place, and time.     Cranial Nerves: No cranial nerve deficit.     Sensory: No sensory deficit.     Motor: No abnormal muscle tone.     Coordination: Coordination normal.     Comments: No dysarthria or aphasia  Psychiatric:        Mood and Affect: Mood normal.        Behavior: Behavior normal.     ED Results / Procedures / Treatments   Labs (all labs ordered are listed, but only abnormal results are displayed) Labs Reviewed  COMPREHENSIVE METABOLIC PANEL - Abnormal; Notable for the following components:      Result Value   Sodium 133 (*)    Potassium 3.3 (*)    CO2 19 (*)    Glucose, Bld 129 (*)    Creatinine, Ser 2.93 (*)    Albumin 3.2 (*)    GFR, Estimated 16 (*)    All other components within normal limits  CBC WITH DIFFERENTIAL/PLATELET - Abnormal; Notable for the following components:   WBC 11.1 (*)    RBC 3.21 (*)    Hemoglobin 10.2 (*)    HCT 30.8 (*)    nRBC 0.7 (*)    Abs Immature Granulocytes 0.37 (*)    All other components within normal limits  URINALYSIS, ROUTINE W REFLEX MICROSCOPIC - Abnormal; Notable for the following components:   APPearance HAZY (*)    Ketones, ur 5 (*)    All other components within normal limits  I-STAT VENOUS BLOOD GAS, ED -  Abnormal; Notable for the following components:   pCO2, Ven 37.1 (*)    pO2, Ven 63.0 (*)    Calcium, Ion 1.12 (*)    HCT 35.0 (*)    Hemoglobin 11.9 (*)    All other components within normal limits  RESP PANEL BY RT-PCR (FLU A&B, COVID) [id number]  POC OCCULT BLOOD, ED    EKG EKG Interpretation  Date/Time:  Saturday January 26 2020 17:00:30 EST Ventricular Rate:  96 PR Interval:    QRS Duration: 61 QT Interval:  391 QTC Calculation: 495 R Axis:   85 Text Interpretation: Sinus rhythm Borderline right axis deviation Low voltage, precordial leads Borderline T abnormalities, anterior leads ST elevation, consider inferior injury Borderline prolonged QT interval Since last tracing QT has lengthened Otherwise no significant change Confirmed by Daleen Bo [id number]) on 01/26/2020 5:22:53 PM   Radiology CT Head Wo Contrast  Result Date: 01/26/2020 CLINICAL DATA:  Weakness and brief periods of unresponsiveness. Chronic neck pain. EXAM: CT HEAD WITHOUT CONTRAST CT CERVICAL SPINE WITHOUT CONTRAST TECHNIQUE: Multidetector CT imaging of the head and cervical spine was performed following the standard protocol without intravenous contrast. Multiplanar CT image reconstructions of the cervical spine were also generated. COMPARISON:  03/20/2017, 01/19/2020 FINDINGS: CT HEAD FINDINGS Brain: No evidence of acute infarction, hemorrhage, hydrocephalus, extra-axial collection or mass lesion/mass effect. Vascular: Atherosclerotic calcifications involving the large vessels of the skull base. No unexpected hyperdense vessel. Skull: Normal. Negative for fracture or focal lesion. Sinuses/Orbits: No acute finding. Other: None. CT CERVICAL SPINE FINDINGS Alignment: Facet joints are aligned without dislocation or traumatic listhesis. Dens and lateral masses are aligned. Skull base and vertebrae: No acute fracture. No primary bone lesion or focal pathologic process. Soft tissues and spinal canal: No prevertebral fluid or  swelling. No visible canal hematoma. Disc levels: Degenerative disc disease most pronounced at C5-6 and C6-7. Disc protrusions are evident at multiple levels. Mild multilevel bilateral facet arthropathy most pronounced at C7-T1. Prominent right-sided uncovertebral spurring C5-6. Overall, no appreciable interval change from prior. Upper chest: Mild paraseptal emphysema within the visualized right lung apex. Other: Bilateral carotid atherosclerosis. IMPRESSION: 1. No acute intracranial findings. 2. No acute fracture or traumatic listhesis of the cervical spine. 3. Degenerative disc disease and facet arthropathy of the cervical spine, similar to prior. Electronically Signed   By: Davina Poke D.O.   On: 01/26/2020 18:51   CT Cervical Spine Wo Contrast  Result Date: 01/26/2020 CLINICAL DATA:  Weakness and brief periods of unresponsiveness. Chronic neck pain. EXAM: CT HEAD WITHOUT CONTRAST CT CERVICAL SPINE WITHOUT CONTRAST TECHNIQUE: Multidetector CT imaging of the head and cervical spine was performed following the standard protocol without intravenous contrast. Multiplanar CT image reconstructions of the cervical spine were also generated. COMPARISON:  03/20/2017, 01/19/2020 FINDINGS: CT HEAD FINDINGS Brain: No evidence of acute infarction, hemorrhage, hydrocephalus, extra-axial collection or mass lesion/mass effect. Vascular: Atherosclerotic calcifications involving the large vessels of the  skull base. No unexpected hyperdense vessel. Skull: Normal. Negative for fracture or focal lesion. Sinuses/Orbits: No acute finding. Other: None. CT CERVICAL SPINE FINDINGS Alignment: Facet joints are aligned without dislocation or traumatic listhesis. Dens and lateral masses are aligned. Skull base and vertebrae: No acute fracture. No primary bone lesion or focal pathologic process. Soft tissues and spinal canal: No prevertebral fluid or swelling. No visible canal hematoma. Disc levels: Degenerative disc disease most  pronounced at C5-6 and C6-7. Disc protrusions are evident at multiple levels. Mild multilevel bilateral facet arthropathy most pronounced at C7-T1. Prominent right-sided uncovertebral spurring C5-6. Overall, no appreciable interval change from prior. Upper chest: Mild paraseptal emphysema within the visualized right lung apex. Other: Bilateral carotid atherosclerosis. IMPRESSION: 1. No acute intracranial findings. 2. No acute fracture or traumatic listhesis of the cervical spine. 3. Degenerative disc disease and facet arthropathy of the cervical spine, similar to prior. Electronically Signed   By: Davina Poke D.O.   On: 01/26/2020 18:51    Procedures Procedures (including critical care time)  Medications Ordered in ED Medications  sodium chloride 0.9 % bolus 500 mL (500 mLs Intravenous New Bag/Given 01/26/20 1901)    ED Course  I have reviewed the triage vital signs and the nursing notes.  Pertinent labs & imaging results that were available during my care of the patient were reviewed by me and considered in my medical decision making (see chart for details).    MDM Rules/Calculators/A&P                           Patient Vitals for the past 24 hrs:  BP Temp Temp src Pulse Resp SpO2 Height  01/26/20 1945 -- -- -- 99 17 98 % --  01/26/20 1930 137/82 -- -- -- -- -- --  01/26/20 1900 116/66 -- -- (!) 108 18 96 % --  01/26/20 1813 123/71 -- -- 92 17 100 % --  01/26/20 1800 123/71 -- -- -- 15 -- --  01/26/20 1730 126/75 -- -- -- 16 -- --  01/26/20 1700 103/68 -- -- -- 19 -- --  01/26/20 1630 113/62 -- -- -- 18 -- --  01/26/20 1621 -- 98.4 F (36.9 C) Axillary -- -- -- --  01/26/20 1620 103/68 -- -- -- 13 -- 5\' 5"  (1.651 m)    8:10 PM Reevaluation with update and discussion. After initial assessment and treatment, an updated evaluation reveals no change in clinical status, patient and daughter updated on findings and plan. Daleen Bo   Medical Decision Making:  This patient is  presenting for evaluation of weakness and difficulty swallowing with weight loss, which does require a range of treatment options, and is a complaint that involves a high risk of morbidity and mortality. The differential diagnoses include acute infection, swallowing disorder, malaise, depression. I decided to review old records, and in summary elderly female with ongoing symptoms and weight loss of a significant nature..  I obtained additional historical information from daughter at the bedside.  Clinical Laboratory Tests Ordered, included CBC, Metabolic panel, Urinalysis and Venous gas. Review indicates abnormal findings including low sodium, low potassium, low CO2, elevated glucose, elevated creatinine, low albumin, low hemoglobin. Radiologic Tests Ordered, included CT head and cervical spine.  I independently Visualized: CT images, which show no acute abnormalities  Cardiac Monitor Tracing which shows normal sinus rhythm    Critical Interventions-clinical evaluation, laboratory testing, IV fluids, orthostatic vital signs, observation reassessment  After These  Interventions, the Patient was reevaluated and was found to require hospitalization for orthostasis, volume depletion, AKI, malaise, difficulty swallowing and large weight loss  CRITICAL CARE-no Performed by: Daleen Bo  Nursing Notes Reviewed/ Care Coordinated Applicable Imaging Reviewed Interpretation of Laboratory Data incorporated into ED treatment  8:10 PM-Consult complete with admitting resident. Patient case explained and discussed.  She agrees to admit patient for further evaluation and treatment. Call ended at 8:10 PM  Plan: Admit    Final Clinical Impression(s) / ED Diagnoses Final diagnoses:  Dehydration  Weight loss  Dysphagia, unspecified type  AKI (acute kidney injury) [facility])    Rx / DC Orders ED Discharge Orders    None       Daleen Bo, MD 01/26/20 2015

## 2020-01-26 NOTE — Hospital Course (Addendum)
Patient states that she is not doing very well today. States that she is coughing up a lot of sputum with pink tint. She still feels like she is not sleeping well.  --Outpatient GI follow-up scheduled for 03/28/2020 --Advance diet as tolerated --Chloraseptic spray for sore throat daily prn --RD consulted, appreciate recommendations --Ensure Enlive po TID; MVI; Magic cup TID with meals --LR 100cc/hr --Daily CBC

## 2020-01-26 NOTE — ED Notes (Signed)
Report given to Donna, RN.

## 2020-01-26 NOTE — ED Triage Notes (Addendum)
Pt to ED today for weakness and brief periods of unresponsiveness. EMS arrived pt SBP in 60s. PIV was started and EMS administered fluid with improvement ot BP. Pt A&Ox4. Complains of chronic arthritic pain in neck and back.   Pt reports taking home BP meds today, but unsure of med names.

## 2020-01-26 NOTE — ED Notes (Signed)
Please contact Daughter, [name], number in chart for any updates/questions

## 2020-01-27 ENCOUNTER — Inpatient Hospital Stay (HOSPITAL_COMMUNITY): Payer: Medicare HMO

## 2020-01-27 ENCOUNTER — Encounter (HOSPITAL_COMMUNITY): Payer: Self-pay | Admitting: Internal Medicine

## 2020-01-27 ENCOUNTER — Observation Stay (HOSPITAL_COMMUNITY): Payer: Medicare HMO

## 2020-01-27 ENCOUNTER — Other Ambulatory Visit: Payer: Medicare HMO

## 2020-01-27 DIAGNOSIS — I700 Atherosclerosis of aorta: Secondary | ICD-10-CM | POA: Diagnosis present

## 2020-01-27 DIAGNOSIS — N183 Chronic kidney disease, stage 3 unspecified: Secondary | ICD-10-CM | POA: Diagnosis not present

## 2020-01-27 DIAGNOSIS — G40109 Localization-related (focal) (partial) symptomatic epilepsy and epileptic syndromes with simple partial seizures, not intractable, without status epilepticus: Secondary | ICD-10-CM

## 2020-01-27 DIAGNOSIS — E876 Hypokalemia: Secondary | ICD-10-CM

## 2020-01-27 DIAGNOSIS — M797 Fibromyalgia: Secondary | ICD-10-CM | POA: Diagnosis present

## 2020-01-27 DIAGNOSIS — E860 Dehydration: Secondary | ICD-10-CM | POA: Diagnosis present

## 2020-01-27 DIAGNOSIS — M5430 Sciatica, unspecified side: Secondary | ICD-10-CM | POA: Diagnosis not present

## 2020-01-27 DIAGNOSIS — R296 Repeated falls: Secondary | ICD-10-CM | POA: Diagnosis present

## 2020-01-27 DIAGNOSIS — E785 Hyperlipidemia, unspecified: Secondary | ICD-10-CM | POA: Diagnosis present

## 2020-01-27 DIAGNOSIS — E871 Hypo-osmolality and hyponatremia: Secondary | ICD-10-CM | POA: Diagnosis present

## 2020-01-27 DIAGNOSIS — K5909 Other constipation: Secondary | ICD-10-CM | POA: Diagnosis present

## 2020-01-27 DIAGNOSIS — B182 Chronic viral hepatitis C: Secondary | ICD-10-CM | POA: Diagnosis present

## 2020-01-27 DIAGNOSIS — F0390 Unspecified dementia without behavioral disturbance: Secondary | ICD-10-CM | POA: Diagnosis present

## 2020-01-27 DIAGNOSIS — E440 Moderate protein-calorie malnutrition: Secondary | ICD-10-CM | POA: Diagnosis present

## 2020-01-27 DIAGNOSIS — K222 Esophageal obstruction: Secondary | ICD-10-CM | POA: Diagnosis present

## 2020-01-27 DIAGNOSIS — B9681 Helicobacter pylori [H. pylori] as the cause of diseases classified elsewhere: Secondary | ICD-10-CM | POA: Diagnosis not present

## 2020-01-27 DIAGNOSIS — R634 Abnormal weight loss: Secondary | ICD-10-CM | POA: Diagnosis present

## 2020-01-27 DIAGNOSIS — K2970 Gastritis, unspecified, without bleeding: Secondary | ICD-10-CM | POA: Diagnosis not present

## 2020-01-27 DIAGNOSIS — I129 Hypertensive chronic kidney disease with stage 1 through stage 4 chronic kidney disease, or unspecified chronic kidney disease: Secondary | ICD-10-CM | POA: Diagnosis present

## 2020-01-27 DIAGNOSIS — I10 Essential (primary) hypertension: Secondary | ICD-10-CM | POA: Diagnosis not present

## 2020-01-27 DIAGNOSIS — M5440 Lumbago with sciatica, unspecified side: Secondary | ICD-10-CM | POA: Diagnosis present

## 2020-01-27 DIAGNOSIS — R441 Visual hallucinations: Secondary | ICD-10-CM | POA: Diagnosis present

## 2020-01-27 DIAGNOSIS — I951 Orthostatic hypotension: Secondary | ICD-10-CM | POA: Diagnosis present

## 2020-01-27 DIAGNOSIS — D649 Anemia, unspecified: Secondary | ICD-10-CM | POA: Diagnosis not present

## 2020-01-27 DIAGNOSIS — F17210 Nicotine dependence, cigarettes, uncomplicated: Secondary | ICD-10-CM | POA: Diagnosis present

## 2020-01-27 DIAGNOSIS — K219 Gastro-esophageal reflux disease without esophagitis: Secondary | ICD-10-CM | POA: Diagnosis present

## 2020-01-27 DIAGNOSIS — N1831 Chronic kidney disease, stage 3a: Secondary | ICD-10-CM | POA: Diagnosis present

## 2020-01-27 DIAGNOSIS — R1314 Dysphagia, pharyngoesophageal phase: Secondary | ICD-10-CM | POA: Diagnosis present

## 2020-01-27 DIAGNOSIS — R1319 Other dysphagia: Secondary | ICD-10-CM | POA: Diagnosis present

## 2020-01-27 DIAGNOSIS — M810 Age-related osteoporosis without current pathological fracture: Secondary | ICD-10-CM | POA: Diagnosis present

## 2020-01-27 DIAGNOSIS — J45909 Unspecified asthma, uncomplicated: Secondary | ICD-10-CM | POA: Diagnosis present

## 2020-01-27 DIAGNOSIS — F0280 Dementia in other diseases classified elsewhere without behavioral disturbance: Secondary | ICD-10-CM | POA: Diagnosis not present

## 2020-01-27 DIAGNOSIS — F419 Anxiety disorder, unspecified: Secondary | ICD-10-CM | POA: Diagnosis present

## 2020-01-27 DIAGNOSIS — Z20822 Contact with and (suspected) exposure to covid-19: Secondary | ICD-10-CM | POA: Diagnosis present

## 2020-01-27 DIAGNOSIS — N179 Acute kidney failure, unspecified: Secondary | ICD-10-CM | POA: Diagnosis present

## 2020-01-27 DIAGNOSIS — R1310 Dysphagia, unspecified: Secondary | ICD-10-CM | POA: Diagnosis not present

## 2020-01-27 LAB — COMPREHENSIVE METABOLIC PANEL
ALT: 17 U/L (ref 0–44)
AST: 19 U/L (ref 15–41)
Albumin: 2.8 g/dL — ABNORMAL LOW (ref 3.5–5.0)
Alkaline Phosphatase: 54 U/L (ref 38–126)
Anion gap: 13 (ref 5–15)
BUN: 16 mg/dL (ref 8–23)
CO2: 19 mmol/L — ABNORMAL LOW (ref 22–32)
Calcium: 8.8 mg/dL — ABNORMAL LOW (ref 8.9–10.3)
Chloride: 103 mmol/L (ref 98–111)
Creatinine, Ser: 1.34 mg/dL — ABNORMAL HIGH (ref 0.44–1.00)
GFR, Estimated: 42 mL/min — ABNORMAL LOW (ref >60)
Glucose, Bld: 109 mg/dL — ABNORMAL HIGH (ref 70–99)
Potassium: 2.8 mmol/L — ABNORMAL LOW (ref 3.5–5.1)
Sodium: 135 mmol/L (ref 135–145)
Total Bilirubin: 0.6 mg/dL (ref 0.3–1.2)
Total Protein: 6.1 g/dL — ABNORMAL LOW (ref 6.5–8.1)

## 2020-01-27 LAB — CBC
HCT: 28 % — ABNORMAL LOW (ref 36.0–46.0)
Hemoglobin: 9.6 g/dL — ABNORMAL LOW (ref 12.0–15.0)
MCH: 31.7 pg (ref 26.0–34.0)
MCHC: 34.3 g/dL (ref 30.0–36.0)
MCV: 92.4 fL (ref 80.0–100.0)
Platelets: 258 10*3/uL (ref 150–400)
RBC: 3.03 MIL/uL — ABNORMAL LOW (ref 3.87–5.11)
RDW: 14 % (ref 11.5–15.5)
WBC: 9.8 10*3/uL (ref 4.0–10.5)
nRBC: 0.5 % — ABNORMAL HIGH (ref 0.0–0.2)

## 2020-01-27 LAB — C-REACTIVE PROTEIN: CRP: 4.7 mg/dL — ABNORMAL HIGH (ref <1.0)

## 2020-01-27 LAB — IRON AND TIBC
Iron: 60 ug/dL (ref 28–170)
Saturation Ratios: 27 % (ref 10.4–31.8)
TIBC: 225 ug/dL — ABNORMAL LOW (ref 250–450)
UIBC: 165 ug/dL

## 2020-01-27 LAB — FERRITIN: Ferritin: 384 ng/mL — ABNORMAL HIGH (ref 11–307)

## 2020-01-27 MED ORDER — POTASSIUM CHLORIDE 10 MEQ/100ML IV SOLN
10.0000 meq | INTRAVENOUS | Status: AC
  Administered 2020-01-27 (×6): 10 meq via INTRAVENOUS
  Filled 2020-01-27 (×5): qty 100

## 2020-01-27 MED ORDER — HYDROCODONE-ACETAMINOPHEN 10-325 MG PO TABS
0.5000 | ORAL_TABLET | Freq: Two times a day (BID) | ORAL | Status: DC | PRN
  Administered 2020-01-27 – 2020-01-30 (×6): 1 via ORAL
  Filled 2020-01-27 (×6): qty 1

## 2020-01-27 MED ORDER — LORAZEPAM 2 MG/ML IJ SOLN
1.0000 mg | Freq: Once | INTRAMUSCULAR | Status: AC
  Administered 2020-01-27: 1 mg via INTRAVENOUS
  Filled 2020-01-27: qty 1

## 2020-01-27 MED ORDER — LACTATED RINGERS IV SOLN: INTRAVENOUS | Status: DC

## 2020-01-27 NOTE — TOC Initial Note (Signed)
Transition of Care [facility]) - Initial/Assessment Note    Patient Details  Name: Heather Horton MRN: [id number] Date of Birth: [birth year]  [facility] CM/SW Contact:    Coralee Pesa, [place] Phone Number: 01/27/2020, 5:17 PM  Clinical Narrative:                 CSW called into pt's room, but spoke with dtr due to disorientation. Dtr noted that she is agreeable to SNF and faxout she requested that the facility be "half way decent and close by". She noted that there was a facility close to the hospital that she did not want, but could not remember the name.CSW will complete faxout, but will hold off on insurance until closer to DC. Pt has had both Covid shots.   Expected Discharge Plan: Skilled Nursing Facility Barriers to Discharge: Continued Medical Work up, Ship broker, SNF Pending bed offer   Patient Goals and CMS Choice Patient states their goals for this hospitalization and ongoing recovery are:: Pt unable to participate in DC planning due to disorientation, dtr agreeable to SNF placement   Choice offered to / list presented to : Adult Children  Expected Discharge Plan and Services Expected Discharge Plan: [place] Acute Care Choice: [place] Living arrangements for the past 2 months: Single Family Home                                      Prior Living Arrangements/Services Living arrangements for the past 2 months: Single Family Home Lives with:: Self Patient language and need for interpreter reviewed:: Yes Do you feel safe going back to the place where you live?: Yes      Need for Family Participation in Patient Care: Yes (Comment) Care giver support system in place?: Yes (comment)   Criminal Activity/Legal Involvement Pertinent to Current Situation/Hospitalization: No - Comment as needed  Activities of Daily Living Home Assistive Devices/Equipment: Wheelchair, Environmental consultant (specify type) ADL Screening  (condition at time of admission) Patient's cognitive ability adequate to safely complete daily activities?: No Is the patient deaf or have difficulty hearing?: Yes Does the patient have difficulty seeing, even when wearing glasses/contacts?: No Does the patient have difficulty concentrating, remembering, or making decisions?: Yes Patient able to express need for assistance with ADLs?: Yes Does the patient have difficulty dressing or bathing?: Yes Independently performs ADLs?: No Communication: Independent Dressing (OT): Needs assistance Is this a change from baseline?: Pre-admission baseline Grooming: Needs assistance Is this a change from baseline?: Pre-admission baseline Feeding: Independent Bathing: Needs assistance Is this a change from baseline?: Pre-admission baseline Toileting: Needs assistance Is this a change from baseline?: Pre-admission baseline In/Out Bed: Needs assistance Is this a change from baseline?: Pre-admission baseline Walks in Home: Dependent Is this a change from baseline?: Pre-admission baseline Does the patient have difficulty walking or climbing stairs?: Yes Weakness of Legs: Both Weakness of Arms/Hands: None  Permission Sought/Granted Permission sought to share information with : Family Supports Permission granted to share information with : Yes, Verbal Permission Granted  Share Information with NAME: Luan Pulling     Permission granted to share info w Relationship: Daughter  Permission granted to share info w Contact Information: [phone]  Emotional Assessment Appearance:: Other (Comment Required (Unable to assess) Attitude/Demeanor/Rapport: Unable to Assess Affect (typically observed): Unable to Assess Orientation: : Oriented to Self Alcohol / Substance Use:  Not Applicable Psych Involvement: No (comment)  Admission diagnosis:  Dehydration [E86.0] Weight loss [R63.4] AKI (acute kidney injury) ([place]) [N17.9] Dysphagia, unspecified type  [R13.10] Esophageal dysphagia [R13.19] Patient Active Problem List   Diagnosis Date Noted  . Esophageal dysphagia 01/27/2020  . Unintentional weight loss of more than 10 pounds 01/27/2020  . Temporal lobe epilepsy ([place]) 01/27/2020  . Aortic atherosclerosis ([place]) 01/27/2020  . AKI (acute kidney injury) ([place]) 01/26/2020  . History of cervical cancer 04/29/2017  . Generalized anxiety disorder 07/22/2015  . Liver fibrosis 04/17/2015  . Family history of colon cancer 12/17/2014  . History of colonic polyps 12/17/2014  . Fibromyalgia 11/28/2014  . Osteoarthritis of multiple joints 10/17/2014  . Esophageal reflux 10/17/2014  . Benign essential HTN 10/17/2014  . Smoker 10/17/2014  . Chronic constipation   . Fibrocystic breast disease    PCP:  [place]:   [id number] - [place], [place] [address] Phone: [phone] Fax: [phone]     Social Determinants of Health (SDOH) Interventions    Readmission Risk Interventions No flowsheet data found.

## 2020-01-27 NOTE — Progress Notes (Addendum)
NAME:  Heather Horton, MRN:  [id number], DOB:  [birth year], LOS: 0 ADMISSION DATE:  01/26/2020  Interm history/ Subjective   New admission overnight.  On entering the room this morning, pt notes that she just does not feel well. When I asked her to expand on this, she relates it to her chronic lower extremity pain.   She also endorses a long standing history of trouble sleeping.  She notes persistent pain over her left eyebrow.   While attempting to obtain history from pt, daughter continued to attempt to talk about pt's weight loss.  Objective   Blood pressure 125/74, pulse 96, temperature 97.8 F (36.6 C), temperature source Oral, resp. rate 18, height 5\' 5"  (1.651 m), weight 82.6 kg, SpO2 100 %.     Intake/Output Summary (Last 24 hours) at 01/27/2020 0516 Last data filed at 01/27/2020 0349 Gross per 24 hour  Intake 1064.67 ml  Output --  Net 1064.67 ml   Filed Weights   01/26/20 2214  Weight: 82.6 kg    Examination: General: well appearing in NAD Cardiac: RRR. No LE edema Pulm: lungs clear. Breathing on room air Abd: mild discomfort on palpation of the LLQ. bs active.  Labs    CBC Latest Ref Rng & Units 01/27/2020 01/26/2020 01/26/2020  WBC 4.0 - 10.5 K/uL 9.8 - 11.1(H)  Hemoglobin 12.0 - 15.0 g/dL 9.6(L) 11.9(L) 10.2(L)  Hematocrit 36 - 46 % 28.0(L) 35.0(L) 30.8(L)  Platelets 150 - 400 K/uL 258 - 271   BMP Latest Ref Rng & Units 01/26/2020 01/26/2020 01/19/2020  Glucose 70 - 99 mg/dL - 129(H) 104(H)  BUN 8 - 23 mg/dL - 22 15  Creatinine 0.44 - 1.00 mg/dL - 2.93(H) 1.43(H)  BUN/Creat Ratio 12 - 28 - - -  Sodium 135 - 145 mmol/L 135 133(L) 133(L)  Potassium 3.5 - 5.1 mmol/L 3.6 3.3(L) 3.4(L)  Chloride 98 - 111 mmol/L - 100 102  CO2 22 - 32 mmol/L - 19(L) 15(L)  Calcium 8.9 - 10.3 mg/dL - 9.2 9.7    Summary  74 year old female admitted to IMTS on 11/27 for further evaluation of poor appetite.  Assessment & Plan:  Principal Problem:   AKI (acute kidney  injury) ([place]) Active Problems:   Esophageal dysphagia   Unintentional weight loss of more than 10 pounds   Temporal lobe epilepsy ([place])  Constellation of symptoms: poor appetite, memory concerns, frequent falls Broad ddx at this time with depression and failure to thrive being most likely. Of note, she does have a history of fibromyalgia. Also considered malignancy, thyroid disorder, medication interactions.   Primary concern on admission was weight loss and poor appetite. Daughter reported 20lb weight loss over the past few weeks. Admission weight 85kg. I reviewed the history of her weights since 2017. It seems that she had gained significant weight from November 2019 to August 2020 after which time it trended back down. In comparing her weight to 2019, she has actually gained a little weight. 2017-82.6kg 2019- 81, 83kg 2020-90, 91kg 2021- 90kg (March), 83kg (October), 81kg (November 8), 85 (Nov 28)  On chart review, it is fairly clear that she has suffered from severe anxiety for several years. This, along with her constellation of symptoms including poor sleep, fatigue, memory, poor appetite, and psychomotor slowing supports a diagnosis of depression. Despite all of this, I am unable to find documentation of her being trialed on an SSRI in the past.  Interestingly, on chart review, there is a note from  [facility] hospital from 2013 at which time she presented with neuropathic pain, gait ataxia, word finding difficulties, speech slurring, and memory concerns. She underwent imaging at that time to evaluate for a central demyelinating process which was unremarkable.   TSH noted to be normal from 12/28/19  In regards to malignancy evaluation, her last colonoscopy, in 2020, was unremarkable. No prior EGD based on chart review. Last mammogram was 12/2018 which was BIRADS 1. She has also undergone extensive imaging in the last month including 1 CXR, two head CTs, 1 abdomen/pelvis CT, 1 C-Spine CT,  and xrays of her cervical, thoracic and lumbar spine. These have all been essentially unremarkable aside from chronic findings and no evidence of malignancy has been present.  Plan: Despite lack of findings to support to support an organic cause, I do think it is reasonable for her to undergo age appropriate cancer screening. I will also consult GI to consider EGD. Last colonoscopy was unremarkable in 2020.  -Brain MRI (previously ordered by neurology) to rule out structural causes of her frequent falls. Chest CT to r/o malignancy? If negative, I would not recommend further imaging given the amount of radiation she has undergone in the past few weeks. -GI consult for consideration of EGD -continue remeron and seroquel -once organic causes have been ruled out, I do think it would be worth considering the initiation of an SNRI which could be helpful for both her depression as well as her chronic pain -nutrition consult  Normocytic anemia. Interval decrease from 12 to 9.6 since admission is likely hemodilution from IVF. No sign of active bleeding.  -iron panel -repeat labs in AM  Acute kidney injury (pre-renal) on CKD stage 3a  Mild hypokalemia and hyponatremia--suspect this is related to her poor oral intake. -hold home losartan, hctz -continue IVF with LR -recheck labs in AM  Headache. No concerning findings for ICH appreciable on head CT although pt has had several falls recently according to her daughter. No red flag sx at this time.  Hx of hypertension and hyperlipidemia. Normotensive at this time. Hold losartan, hctz  Chronic back pain. Resume norco   Best practice:  CODE STATUS: Full Diet: regular DVT for prophylaxis: lovenox Social considerations/Family communication: daughter updated at bedside Dispo: pending further workup   Mitzi Hansen, MD Internal Medicine Resident PGY-2 [facility] Pager: [phone] Please use the IMTS after hours  pager at [phone] after 5pm M-F and on weekends 01/27/20  5:16 AM

## 2020-01-27 NOTE — Evaluation (Signed)
Physical Therapy Evaluation Patient Details Name: Heather Horton MRN: [id number] DOB: 09-11-1945 Today's Date: 01/27/2020   History of Present Illness  Pt is a 74 y/o F presenting to ED on 11/27 for weakness that caused her legs to give out on her and decreased responsiveness. Pt has had increased falls over the past 2 months with loss of appetite/weight loss. Pt with AKI. PMH includes hepatitis C, GERD, HTN, hyperlipidemia, prior cervical cancer, chronic cervical and LBP, and seizures (on Keppra).  Clinical Impression  Pt lives alone with daughter assist at night. Per pt she has been sitting and sleeping in recliner, unable to walk for 1 week has been having instability and weakness at home and daughter works without ability to provide 24hr care. Pt with significant weakness, decreased cognition, inability to transfer without assist and inability to walk who will benefit from acute therapy to maximize mobility, safety and function.SNF recommended as pt states family not available. Encouraged daily mobility with nursing staff.     Follow Up Recommendations SNF;Supervision/Assistance - 24 hour    Equipment Recommendations  3in1 (PT);Wheelchair (measurements PT)    Recommendations for Other Services OT consult     Precautions / Restrictions Precautions Precautions: Fall      Mobility  Bed Mobility Overal bed mobility: Needs Assistance Bed Mobility: Supine to Sit     Supine to sit: HOB elevated;Mod assist     General bed mobility comments: HOB 25 degrees, assist to lift trunk, increased time and struggle to pivot to left side of bed    Transfers Overall transfer level: Needs assistance   Transfers: Sit to/from Stand;Stand Pivot Transfers Sit to Stand: Mod assist Stand pivot transfers: Mod assist       General transfer comment: cues for hand placement, assist to rise. Increased time to achieve standing with partial hip and knee flexion maintained. Pivot from bed to chair with  RW with pt "bouncing" throughout with partial knee flexion stating she feels as if she will fall. Cues for posture, safety and proximity to Rw with pt able to take short steps to pivot from bed to chair  Ambulation/Gait             General Gait Details: unable  Stairs            Wheelchair Mobility    Modified Rankin (Stroke Patients Only)       Balance Overall balance assessment: Needs assistance Sitting-balance support: Feet supported Sitting balance-Leahy Scale: Good     Standing balance support: Bilateral upper extremity supported Standing balance-Leahy Scale: Poor Standing balance comment: pt with reliance on bil UE support on RW and physical assistance                             Pertinent Vitals/Pain Pain Assessment: 0-10 Pain Score: 10-Worst pain ever Pain Location: left arm at IV site Pain Descriptors / Indicators: Burning;Constant Pain Intervention(s): Limited activity within patient's tolerance;Monitored during session;Repositioned    Home Living Family/patient expects to be discharged to:: Private residence Living Arrangements: Alone Available Help at Discharge: Family;Available PRN/intermittently Type of Home: House Home Access: Stairs to enter   [employer] of Steps: threshold Home Layout: One level Home Equipment: Bedside commode;Walker - 4 wheels Additional Comments: daughter stays with pt at night    Prior Function Level of Independence: Needs assistance   Gait / Transfers Assistance Needed: pt reports staying in recliner all day and sleeping in recliner with intermittent  and infrequent mobility to bathroom during day alone. Pt reports she hasn't walked in a week  ADL's / Homemaking Assistance Needed: daughter does the cooking, driving, daughter giving her a bath at night        Hand Dominance        Extremity/Trunk Assessment   Upper Extremity Assessment Upper Extremity Assessment: Generalized weakness     Lower Extremity Assessment Lower Extremity Assessment: Generalized weakness    Cervical / Trunk Assessment Cervical / Trunk Assessment: Kyphotic  Communication      Cognition Arousal/Alertness: Awake/alert Behavior During Therapy: WFL for tasks assessed/performed Overall Cognitive Status: Impaired/Different from baseline Area of Impairment: Safety/judgement;Problem solving;Orientation                 Orientation Level: Disoriented to;Time;Situation       Safety/Judgement: Decreased awareness of safety;Decreased awareness of deficits   Problem Solving: Slow processing        General Comments      Exercises     Assessment/Plan    PT Assessment Patient needs continued PT services  PT Problem List Decreased strength;Decreased mobility;Decreased activity tolerance;Decreased balance;Decreased knowledge of use of DME;Pain;Obesity       PT Treatment Interventions DME instruction;Gait training;Stair training;Functional mobility training;Therapeutic activities;Patient/family education    PT Goals (Current goals can be found in the Care Plan section)  Acute Rehab PT Goals Patient Stated Goal: return home PT Goal Formulation: With patient Time For Goal Achievement: 02/10/20 Potential to Achieve Goals: Fair    Frequency Min 3X/week   Barriers to discharge Decreased caregiver support      Co-evaluation               AM-PAC PT "6 Clicks" Mobility  Outcome Measure Help needed turning from your back to your side while in a flat bed without using bedrails?: A Little Help needed moving from lying on your back to sitting on the side of a flat bed without using bedrails?: A Little Help needed moving to and from a bed to a chair (including a wheelchair)?: A Lot Help needed standing up from a chair using your arms (e.g., wheelchair or bedside chair)?: A Little Help needed to walk in hospital room?: A Lot Help needed climbing 3-5 steps with a railing? : Total 6  Click Score: 14    End of Session Equipment Utilized During Treatment: Gait belt Activity Tolerance: Patient tolerated treatment well Patient left: in chair;with call bell/phone within reach;with chair alarm set Nurse Communication: Mobility status PT Visit Diagnosis: Other abnormalities of gait and mobility (R26.89);Difficulty in walking, not elsewhere classified (R26.2);Muscle weakness (generalized) (M62.81);Unsteadiness on feet (R26.81)    Time: 0076-2263 PT Time Calculation (min) (ACUTE ONLY): 32 min   Charges:   PT Evaluation $PT Eval Moderate Complexity: 1 Mod PT Treatments $Therapeutic Activity: 8-22 mins        Deira Shimer P, PT Acute Rehabilitation Services Pager: [phone] Office: [place] Ahli[place] Glatt 01/27/2020, 10:44 AM

## 2020-01-27 NOTE — Evaluation (Signed)
Occupational Therapy Evaluation Patient Details Name: Heather Horton MRN: [id number] DOB: 05-01-1945 Today's Date: 01/27/2020    History of Present Illness Pt is a 74 y/o F presenting to ED on 11/27 for weakness that caused her legs to give out on her and decreased responsiveness. Pt has had increased falls over the past 2 months with loss of appetite/weight loss. Pt with AKI. PMH includes hepatitis C, GERD, HTN, hyperlipidemia, prior cervical cancer, chronic cervical and LBP, and seizures (on Keppra).   Clinical Impression   Pt admitted with above. She demonstrates the below listed deficits and will benefit from continued OT to maximize safety and independence with BADLs.  Pt currently presents with generalized weakness, decreased activity tolerance, increased confusion with hallucinations (seeing people on her Rt), Rt gaze preference.  She currently requires mod A to self feed, max A for grooming and total A for remainder of ADLs.  PTA, she lived alone with daughter checking on her in the am and pm to assist with meals, IADLs, medication management, etc.  Pt ambulated with RW.  Anticipate pt will require SNF level rehab to maximize independence with ADLs prior to return home.       Follow Up Recommendations  SNF;Supervision/Assistance - 24 hour    Equipment Recommendations  None recommended by OT    Recommendations for Other Services       Precautions / Restrictions Precautions Precautions: Fall      Mobility Bed Mobility               General bed mobility comments: deferred due to pt fatigue     Transfers                 General transfer comment: deferred due to pt fatige     Balance                                           ADL either performed or assessed with clinical judgement   ADL Overall ADL's : Needs assistance/impaired Eating/Feeding: Moderate assistance;Bed level Eating/Feeding Details (indicate cue type and reason): requires  mod A, and max multi modal cues to feed self 3-4 bites of hamburger.  She is able to drink from cup with occasional min A and max cues  Grooming: Wash/dry hands;Wash/dry face;Oral care;Maximal assistance;Bed level   Upper Body Bathing: Total assistance;Bed level   Lower Body Bathing: Total assistance;Bed level   Upper Body Dressing : Total assistance;Bed level   Lower Body Dressing: Total assistance;Bed level   Toilet Transfer: Total assistance             General ADL Comments: limited by cognition and lethargy      Vision   Additional Comments: pt demonstrates Rt gaze preference.  She will look to OT on the Lt, but not consistently and requires cues to locate food items on her Lt      Perception Perception Perception Tested?: Yes Perception Deficits: Inattention/neglect Inattention/Neglect: Does not attend to left visual field   Praxis      Pertinent Vitals/Pain Pain Assessment: No/denies pain     Hand Dominance     Extremity/Trunk Assessment Upper Extremity Assessment Upper Extremity Assessment: Generalized weakness   Lower Extremity Assessment Lower Extremity Assessment: Defer to PT evaluation       Communication Communication Communication: No difficulties   Cognition Arousal/Alertness: Awake/alert;Lethargic Behavior During Therapy: [facility] for tasks  assessed/performed Overall Cognitive Status: Impaired/Different from baseline Area of Impairment: Attention;Orientation;Memory;Following commands;Awareness;Safety/judgement;Problem solving                 Orientation Level: Disoriented to;Place;Time;Situation Current Attention Level: Focused;Sustained Memory: Decreased short-term memory Following Commands: Follows one step commands inconsistently;Follows one step commands with increased time Safety/Judgement: Decreased awareness of safety;Decreased awareness of deficits   Problem Solving: Slow processing;Difficulty sequencing;Decreased  initiation;Requires verbal cues;Requires tactile cues General Comments: daughter reports pt received meds for MRI earlier.  pt initially lethargic, but this improved throughout session.  She initially thought she was at home.  with mod cues, she was able to identify that she is in the hospital.  She demonstrates focused attention with brief periods of sustained attention.  Max cues for problem solving during familiar ADL task.  Pt reports seeing people who aren't there on her Rt    General Comments  daughter present     Exercises     Shoulder Instructions      Home Living Family/patient expects to be discharged to:: Private residence Living Arrangements: Alone Available Help at Discharge: Family;Available PRN/intermittently Type of Home: House Home Access: Stairs to enter [employer] of Steps: threshold   Home Layout: One level     Bathroom Shower/Tub: Teacher, early years/pre: Standard     Home Equipment: Bedside commode;Walker - 4 wheels   Additional Comments: Pt's daughter visits in the am, and again in the pm after work.  She provides meals and manages medications       Prior Functioning/Environment Level of Independence: Needs assistance  Gait / Transfers Assistance Needed: Pt sleeps in recliner.  She is mod I ambulating to BR and kitchen to retrieve pre prepared meals.  Activity level has decreased over the past week  with a generalized decline over the past 2 mos  ADL's / Homemaking Assistance Needed: daughter assists with bathing and IADLs             OT Problem List: Decreased strength;Decreased activity tolerance;Impaired balance (sitting and/or standing);Decreased cognition;Impaired vision/perception;Decreased safety awareness;Decreased knowledge of use of DME or AE      OT Treatment/Interventions: Self-care/ADL training;Neuromuscular education;Therapeutic exercise;DME and/or AE instruction;Cognitive remediation/compensation;Therapeutic  activities;Visual/perceptual remediation/compensation;Patient/family education;Balance training    OT Goals(Current goals can be found in the care plan section) Acute Rehab OT Goals Patient Stated Goal: pt unable to state  OT Goal Formulation: With patient/family Time For Goal Achievement: 02/10/20 Potential to Achieve Goals: Good ADL Goals Pt Will Perform Eating: with set-up;with supervision;sitting Pt Will Perform Grooming: with min assist;sitting Pt Will Perform Upper Body Bathing: with mod assist;sitting Pt Will Perform Lower Body Bathing: with mod assist;sit to/from stand Pt Will Transfer to Toilet: with min assist;stand pivot transfer;bedside commode Pt Will Perform Toileting - Clothing Manipulation and hygiene: with min assist;sit to/from stand Additional ADL Goal #1: Pt will demonstrate selective attention during familiar ADL tasks with min cues  OT Frequency: Min 2X/week   Barriers to D/C:    unsure if family able to provide 24 hour assist        Co-evaluation              AM-PAC OT "6 Clicks" Daily Activity     Outcome Measure Help from another person eating meals?: A Lot Help from another person taking care of personal grooming?: A Lot Help from another person toileting, which includes using toliet, bedpan, or urinal?: Total Help from another person bathing (including washing, rinsing, drying)?: Total Help from  another person to put on and taking off regular upper body clothing?: Total Help from another person to put on and taking off regular lower body clothing?: Total 6 Click Score: 8   End of Session Nurse Communication: Mobility status  Activity Tolerance: Patient limited by lethargy Patient left: in bed;with call bell/phone within reach;with bed alarm set;with family/visitor present;with nursing/sitter in room  OT Visit Diagnosis: Cognitive communication deficit (R41.841)                Time: 2890-2284 OT Time Calculation (min): 41 min Charges:  OT  Evaluation $OT Eval Moderate Complexity: 1 Mod OT Treatments $Self Care/Home Management : 23-37 mins  Nilsa Nutting., OTR/L Acute Rehabilitation Services Pager [phone] Office [phone]   Lucille Passy M 01/27/2020, 4:00 PM

## 2020-01-27 NOTE — Progress Notes (Signed)
Coordinating with CT. Attempted to contact RN 2x for MRI clearance - neither attempt successful.

## 2020-01-27 NOTE — Evaluation (Addendum)
Clinical/Bedside Swallow Evaluation Patient Details  Name: Heather Horton MRN: [id number] Date of Birth: Mar 08, 1945  Today's Date: 01/27/2020 Time: SLP Start Time (ACUTE ONLY): 1205 SLP Stop Time (ACUTE ONLY): 1220 SLP Time Calculation (min) (ACUTE ONLY): 15 min  Past Medical History:  Past Medical History:  Diagnosis Date  . Acute encephalopathy   . Allergy   . Anemia   . Anxiety   . Arthritis   . Asthma   . Blood transfusion without reported diagnosis   . Bronchitis   . Bursitis   . Cervical cancer ([place])   . Chronic constipation   . Chronic hepatitis C without hepatic coma ([place]) 10/17/2014   11/10/2015 visit with Dr. Linus Salmons. Repeat labs revealed resolution of infection following treatment.  [place], [place], FNP-C; last seen 01/2014 Genotype 1b Diagnosed 01/2013  . Colon polyps   . Fibrocystic breast disease   . Galactorrhea on right side   . GERD (gastroesophageal reflux disease)   . Hepatitis C   . Hyperlipidemia   . Hypertension   . Insomnia   . Osteoporosis   . Respiratory failure ([place]) 03/20/2017  . Seizures ([place])    Past Surgical History:  Past Surgical History:  Procedure Laterality Date  . ABDOMINAL HYSTERECTOMY  30's   due to cervical cancer  . APPENDECTOMY     HPI:  Pt is a 74 year old female with history of acute encephalopathy, cervical cancer s/p hysterectomy, chronic constipation, chronic hepatitis C, GERD, seizure, and chronic back pain on chronic opiate therapy who presented for weakness and loss of appetite with associated weight loss. CT head was negative. Per Dr. Nino Glow note on 11/27: "patient does not report any dysphagia or odynophagia and the patient does not have a problem with swallowing."  MRI pending.   Assessment / Plan / Recommendation Clinical Impression  Pt was seen for bedside swallow evaluation with her daughter present. Pt reported that liquids and solids "get stuck and won't go down". She  identified her mid chest as the site of the symptom and stated that she typically rubs her chest to try and help move the solids or liquids down. Per the pt, she was similarly symptomatic >8 years prior and her symptoms resolved with esophageal dilation. Oral mechanism exam was [facility] and dentition was adequate. She tolerated all solids and liquids without signs or symptoms of oropharyngeal dysphagia, but stated that the liquids were "sticking". Pt's symptoms appear to be esophageal in nature and SLP recommends esophageal assessment (e.g., esophagram) and/or GI consult. Pt's referring MD, Dr. Sherry Ruffing, and Dr. Rebeca Alert were advised of results and recommendations and Dr. Sherry Ruffing indicated that GI has been consulted. Pt may have regular texture solids and thin liquids from an oropharyngeal standpoint. However, following discussion with Dr. Sherry Ruffing, it was agreed that her NPO status will be maintained for possible GI workup. Further skilled SLP services are not clinically indicated for swallowing.  SLP Visit Diagnosis: Dysphagia, unspecified (R13.10)    Aspiration Risk       Diet Recommendation Regular;Thin liquid   Liquid Administration via: Cup;Straw Medication Administration: Whole meds with liquid Supervision: Patient able to self feed Postural Changes: Seated upright at 90 degrees    Other  Recommendations Recommended Consults: Consider GI evaluation;Consider esophageal assessment Oral Care Recommendations: Oral care BID   Follow up Recommendations None      Frequency and Duration            Prognosis  Swallow Study   General Date of Onset: 01/26/20 HPI: Pt is a 74 year old female with history of acute encephalopathy, cervical cancer s/p hysterectomy, chronic constipation, chronic hepatitis C, GERD, seizure, and chronic back pain on chronic opiate therapy who presented for weakness and loss of appetite with associated weight loss. CT head was negative. Per Dr. Nino Glow note on 11/27:  "patient does not report any dysphagia or odynophagia and the patient does not have a problem with swallowing."  Type of Study: Bedside Swallow Evaluation Previous Swallow Assessment: None Diet Prior to this Study: NPO Temperature Spikes Noted: No Respiratory Status: Room air History of Recent Intubation: No Behavior/Cognition: Alert;Cooperative;Pleasant mood Oral Cavity Assessment: Within Functional Limits Oral Care Completed by SLP: No Vision: Functional for self-feeding Self-Feeding Abilities: Able to feed self Patient Positioning: Upright in chair;Postural control adequate for testing Baseline Vocal Quality: Normal Volitional Swallow: Able to elicit    Oral/Motor/Sensory Function Overall Oral Motor/Sensory Function: Within functional limits   Ice Chips Ice chips: Within functional limits Presentation: Spoon   Thin Liquid      Nectar Thick Nectar Thick Liquid: Not tested   Honey Thick Honey Thick Liquid: Not tested   Puree Puree: Within functional limits Presentation: Spoon   Solid    Josanna Hefel I. Hardin Negus, [place], [place] Office number [phone] Pager [phone]  Solid: Within functional limits Presentation: Spoon      Horton Marshall 01/27/2020,1:00 PM

## 2020-01-27 NOTE — NC FL2 (Signed)
[place] LEVEL OF CARE SCREENING TOOL     IDENTIFICATION  Patient Name: Heather Horton Birthdate: Jul 29, 1945 Sex: female Admission Date (Current Location): 01/26/2020  [facility] and [place] Number:  Herbalist and Address:  [facility] [place] [address], [place],  [address]      Provider Number: [id number]  Attending Physician Name and Address:  Oda Kilts, MD  Relative Name and Phone Number:  Luan Pulling, daughter, [phone]    Current Level of Care: Hospital Recommended Level of Care: [place] Prior Approval Number:    Date Approved/Denied:   PASRR Number: [id number]  Discharge Plan: SNF    Current Diagnoses: Patient Active Problem List   Diagnosis Date Noted  . Esophageal dysphagia 01/27/2020  . Unintentional weight loss of more than 10 pounds 01/27/2020  . Temporal lobe epilepsy ([place]) 01/27/2020  . Aortic atherosclerosis ([place]) 01/27/2020  . AKI (acute kidney injury) ([place]) 01/26/2020  . History of cervical cancer 04/29/2017  . Generalized anxiety disorder 07/22/2015  . Liver fibrosis 04/17/2015  . Family history of colon cancer 12/17/2014  . History of colonic polyps 12/17/2014  . Fibromyalgia 11/28/2014  . Osteoarthritis of multiple joints 10/17/2014  . Esophageal reflux 10/17/2014  . Benign essential HTN 10/17/2014  . Smoker 10/17/2014  . Chronic constipation   . Fibrocystic breast disease     Orientation RESPIRATION BLADDER Height & Weight     Self  Normal Incontinent Weight: 188 lb 0.8 oz (85.3 kg) Height:  5\' 5"  (165.1 cm)  BEHAVIORAL SYMPTOMS/MOOD NEUROLOGICAL BOWEL NUTRITION STATUS      Incontinent Diet (See discharge summary)  AMBULATORY STATUS COMMUNICATION OF NEEDS Skin   Extensive Assist Verbally Skin abrasions (R & L knee, barrier foam)                       Personal Care Assistance Level of Assistance  Bathing, Feeding, Dressing Bathing Assistance:  Maximum assistance Feeding assistance: Limited assistance Dressing Assistance: Maximum assistance     Functional Limitations Info  Sight, Hearing, Speech Sight Info: Impaired Hearing Info: Adequate Speech Info: Adequate    SPECIAL CARE FACTORS FREQUENCY  PT (By licensed PT), OT (By licensed OT)     PT Frequency: 5x week OT Frequency: 5x week            Contractures Contractures Info: Not present    Additional Factors Info  Code Status, Allergies, Psychotropic Code Status Info: Full Allergies Info: Molds and smuts, pollen extract, aspirin Psychotropic Info: QUEtiapine (SEROQUEL)         Current Medications (01/27/2020):  This is the current hospital active medication list Current Facility-Administered Medications  Medication Dose Route Frequency Provider Last Rate Last Admin  . acetaminophen (TYLENOL) tablet 650 mg  650 mg Oral Q6H PRN Asencion Noble, MD   650 mg at 01/27/20 0026   Or  . acetaminophen (TYLENOL) suppository 650 mg  650 mg Rectal Q6H PRN Asencion Noble, MD      . heparin injection 5,000 Units  5,000 Units Subcutaneous Q8H Asencion Noble, MD   5,000 Units at 01/27/20 [phone]  . HYDROcodone-acetaminophen (NORCO) 10-325 MG per tablet 0.5-1 tablet  0.5-1 tablet Oral Q12H PRN Mitzi Hansen, MD   1 tablet at 01/27/20 1106  . lactated ringers infusion   Intravenous Continuous Mitzi Hansen, MD 100 mL/hr at 01/27/20 1244 New Bag at 01/27/20 1244  . levETIRAcetam (KEPPRA) tablet 500 mg  500  mg Oral BID Asencion Noble, MD   500 mg at 01/27/20 1000  . mirtazapine (REMERON) tablet 7.5 mg  7.5 mg Oral QHS Lonia Skinner M, MD   7.5 mg at 01/26/20 2320  . pantoprazole (PROTONIX) EC tablet 40 mg  40 mg Oral QHS Asencion Noble, MD   40 mg at 01/26/20 2319  . QUEtiapine (SEROQUEL) tablet 300 mg  300 mg Oral QHS Asencion Noble, MD   300 mg at 01/26/20 2320     Discharge Medications: Please see discharge summary for a list of discharge  medications.  Relevant Imaging Results:  Relevant Lab Results:   Additional Information SS# [id number]  Coralee Pesa, LCSWA

## 2020-01-28 ENCOUNTER — Inpatient Hospital Stay (HOSPITAL_COMMUNITY): Payer: Medicare HMO

## 2020-01-28 DIAGNOSIS — N179 Acute kidney failure, unspecified: Secondary | ICD-10-CM

## 2020-01-28 DIAGNOSIS — E440 Moderate protein-calorie malnutrition: Secondary | ICD-10-CM

## 2020-01-28 DIAGNOSIS — E860 Dehydration: Secondary | ICD-10-CM | POA: Diagnosis not present

## 2020-01-28 DIAGNOSIS — G40109 Localization-related (focal) (partial) symptomatic epilepsy and epileptic syndromes with simple partial seizures, not intractable, without status epilepticus: Secondary | ICD-10-CM

## 2020-01-28 DIAGNOSIS — R634 Abnormal weight loss: Secondary | ICD-10-CM

## 2020-01-28 DIAGNOSIS — K219 Gastro-esophageal reflux disease without esophagitis: Secondary | ICD-10-CM | POA: Diagnosis not present

## 2020-01-28 DIAGNOSIS — R1310 Dysphagia, unspecified: Secondary | ICD-10-CM | POA: Diagnosis not present

## 2020-01-28 DIAGNOSIS — M5430 Sciatica, unspecified side: Secondary | ICD-10-CM

## 2020-01-28 DIAGNOSIS — F0280 Dementia in other diseases classified elsewhere without behavioral disturbance: Secondary | ICD-10-CM

## 2020-01-28 LAB — BASIC METABOLIC PANEL
Anion gap: 11 (ref 5–15)
BUN: 11 mg/dL (ref 8–23)
CO2: 18 mmol/L — ABNORMAL LOW (ref 22–32)
Calcium: 9 mg/dL (ref 8.9–10.3)
Chloride: 107 mmol/L (ref 98–111)
Creatinine, Ser: 0.74 mg/dL (ref 0.44–1.00)
GFR, Estimated: >60 mL/min (ref >60)
Glucose, Bld: 102 mg/dL — ABNORMAL HIGH (ref 70–99)
Potassium: 3.2 mmol/L — ABNORMAL LOW (ref 3.5–5.1)
Sodium: 136 mmol/L (ref 135–145)

## 2020-01-28 LAB — CBC
HCT: 25.7 % — ABNORMAL LOW (ref 36.0–46.0)
Hemoglobin: 8.8 g/dL — ABNORMAL LOW (ref 12.0–15.0)
MCH: 31.5 pg (ref 26.0–34.0)
MCHC: 34.2 g/dL (ref 30.0–36.0)
MCV: 92.1 fL (ref 80.0–100.0)
Platelets: 246 10*3/uL (ref 150–400)
RBC: 2.79 MIL/uL — ABNORMAL LOW (ref 3.87–5.11)
RDW: 14 % (ref 11.5–15.5)
WBC: 8 10*3/uL (ref 4.0–10.5)
nRBC: 0.4 % — ABNORMAL HIGH (ref 0.0–0.2)

## 2020-01-28 LAB — LACTATE DEHYDROGENASE: LDH: 162 U/L (ref 98–192)

## 2020-01-28 LAB — RPR: RPR Ser Ql: NONREACTIVE

## 2020-01-28 LAB — SEDIMENTATION RATE: Sed Rate: 60 mm/hr — ABNORMAL HIGH (ref 0–22)

## 2020-01-28 LAB — VITAMIN B12: Vitamin B-12: 533 pg/mL (ref 180–914)

## 2020-01-28 LAB — HIV ANTIBODY (ROUTINE TESTING W REFLEX): HIV Screen 4th Generation wRfx: NONREACTIVE

## 2020-01-28 LAB — MAGNESIUM: Magnesium: 2.1 mg/dL (ref 1.7–2.4)

## 2020-01-28 MED ORDER — ENSURE ENLIVE PO LIQD
237.0000 mL | Freq: Three times a day (TID) | ORAL | Status: DC
  Administered 2020-01-28 – 2020-02-01 (×10): 237 mL via ORAL

## 2020-01-28 MED ORDER — PANTOPRAZOLE SODIUM 40 MG PO TBEC
40.0000 mg | DELAYED_RELEASE_TABLET | Freq: Two times a day (BID) | ORAL | Status: DC
  Administered 2020-01-28 – 2020-02-04 (×13): 40 mg via ORAL
  Filled 2020-01-28 (×13): qty 1

## 2020-01-28 MED ORDER — POTASSIUM CHLORIDE CRYS ER 20 MEQ PO TBCR
40.0000 meq | EXTENDED_RELEASE_TABLET | Freq: Two times a day (BID) | ORAL | Status: DC
  Administered 2020-01-28 – 2020-01-29 (×4): 40 meq via ORAL
  Filled 2020-01-28 (×4): qty 2

## 2020-01-28 MED ORDER — ADULT MULTIVITAMIN W/MINERALS CH
1.0000 | ORAL_TABLET | Freq: Every day | ORAL | Status: DC
  Administered 2020-01-28 – 2020-02-04 (×8): 1 via ORAL
  Filled 2020-01-28 (×8): qty 1

## 2020-01-28 NOTE — Progress Notes (Signed)
EEG complete - results pending 

## 2020-01-28 NOTE — Progress Notes (Signed)
Subjective:   [name] is a 74 year old woman with a remote history of cervical cancer as well as smoking, HTN, CKD 3a, temporal lobe seizure, OA, fibromyalgia, and sciatica who presented to [facility] on 01/26/2020 for evaluation of progressive weakness and poor appetite.  Overnight, no acute events.  Patient reports that she feels "so-so" this morning. She is eating breakfast, but reports that she has begun to feel full after only a few bites of her breakfast. She is having the sensation that food is getting stuck in her chest, but reports that she has trouble finding the words to describe the sensation. She states that she would like to go home, however she understands and agrees with the recommendation from PT and OT to discharge to a skilled nursing facility versus 24 hour supervision/assistance. Daughter additionally reports that patient has been increasingly confused and having visual hallucinations since receiving ativan prior to yesterday's imaging studies.  Objective:  Vital signs in last 24 hours: Vitals:   01/27/20 1423 01/27/20 2116 01/28/20 0500 01/28/20 0606  BP: (!) 151/85 140/76  124/72  Pulse: 94 94  100  Resp: $Remo'16 16  16  'cfFky$ Temp:  98.2 F (36.8 C)  98.9 F (37.2 C)  TempSrc:  Oral  Oral  SpO2: 100% 100%  100%  Weight:   85 kg   Height:      SpO2: 100 %  Intake/Output Summary (Last 24 hours) at 01/28/2020 1111 Last data filed at 01/28/2020 0334 Gross per 24 hour  Intake 0 ml  Output --  Net 0 ml   Filed Weights   01/26/20 2214 01/27/20 0500 01/28/20 0500  Weight: 82.6 kg 85.3 kg 85 kg  Physical Exam Vitals and nursing note reviewed. Exam conducted with a chaperone present.  Cardiovascular:     Rate and Rhythm: Normal rate and regular rhythm.     Pulses: Normal pulses.     Heart sounds: Normal heart sounds.  Pulmonary:     Effort: Pulmonary effort is normal. No respiratory distress.     Breath sounds: Normal breath sounds.  Abdominal:     General:  Abdomen is flat. Bowel sounds are normal. There is no distension.     Palpations: Abdomen is soft.     Tenderness: There is no abdominal tenderness.  Psychiatric:     Comments: Looking at the floor having brief conversation with her nurse, "Coralyn Mark," who is not in the room.    CBC Latest Ref Rng & Units 01/28/2020 01/27/2020 01/26/2020  WBC 4.0 - 10.5 K/uL 8.0 9.8 -  Hemoglobin 12.0 - 15.0 g/dL 8.8(L) 9.6(L) 11.9(L)  Hematocrit 36 - 46 % 25.7(L) 28.0(L) 35.0(L)  Platelets 150 - 400 K/uL 246 258 -   BMP Latest Ref Rng & Units 01/28/2020 01/27/2020 01/26/2020  Glucose 70 - 99 mg/dL 102(H) 109(H) -  BUN 8 - 23 mg/dL 11 16 -  Creatinine 0.44 - 1.00 mg/dL 0.74 1.34(H) -  BUN/Creat Ratio 12 - 28 - - -  Sodium 135 - 145 mmol/L 136 135 135  Potassium 3.5 - 5.1 mmol/L 3.2(L) 2.8(L) 3.6  Chloride 98 - 111 mmol/L 107 103 -  CO2 22 - 32 mmol/L 18(L) 19(L) -  Calcium 8.9 - 10.3 mg/dL 9.0 8.8(L) -   Magnesium - 2.1 ESR - 60 Vitamin B12 - 533 RPR - non reactive HIV - non reactive  IMAGING: CT CHEST WO CONTRAST IMPRESSION: 1. Indistinct density lateral to the left nipple, directly beneath the skin likely represents normal  fibroglandular tissue given that fibroglandular tissue was seen in this location on prior mammograms. However, given that it has been greater than 1 year since the patient's most recent screening mammogram, recommend bilateral diagnostic mammogram to evaluate this area. 2. No suspicious findings in the lung. Aortic Atherosclerosis (ICD10-I70.0).  MR BRAIN WO CONTRAST IMPRESSION: Unremarkable appearance of the brain for age on this incomplete, motion degraded examination.  Assessment/Plan:  Principal Problem:   AKI (acute kidney injury) ([place]) Active Problems:   Esophageal reflux   Fibrocystic breast disease   Generalized anxiety disorder   Esophageal dysphagia   Unintentional weight loss of more than 10 pounds   Temporal lobe epilepsy ([place])   Aortic atherosclerosis  ([place])  [name] is a 74 year old woman with a remote history of cervical cancer as well as smoking, HTN, CKD 3a, temporal lobe seizure, OA, fibromyalgia, and sciatica who presented to [facility] on 01/26/2020 with progressive weakness and poor appetite.  #Dysphagia, active Patient tolerating her morning breakfast without complication however she reports food and liquids getting stuck in her throat and early satiety. SLP reports patient to have normal oropharyngeal function. Patient had esophageal dilation in past which reportedly improved her symptoms of dysphagia. Etiology for patient's dysphagia unclear at this time and she would benefit from endoscopic evaluation. -GI consulted, appreciate recommendations  -EGD with dilation tomorrow, NPO at midnight  -Pantoprazole $RemoveBefore'40mg'epHchAEeJVXpM$  twice daily -RD consulted, appreciate recommendations -LR 100cc/hr -Patient may benefit from outpatient gastric emptying study  #Advanced dementia ([place] of 13/30), chronic #Visual hallucinations, active #History of temporal lobe epilepsy, chronic Patient reportedly very confused overnight after receiving ativan prior to imaging studies yesterday. Her confusion has persisted into this morning and accompanied by visual hallucinations of rain outside and people in the room who were not present. CT head and MRI of brain unremarkable for an acute or chronic process. HIV, RPR and Vitamin B12 within normal limits/negative. TSH prior to admission within normal limits. -EEG to rule out seizures in setting of her history of temporal lobe seizure. -Continue mirtazapine daily at bedtime -Continue seroquel -Continue Keppra -Consider neuropsychological testing on an outpatient basis -Continue follow-up with neurology in outpatient setting  #AKI on CKD3a, resolved Creatinine 0.74 down from 1.34 -Continue holding losartan, may restart at later point -Continue holding HCTZ -Continue LR 100cc/hr  #HTN, chronic Normotensive while  holding home losartan and HCTZ. If patient to become hypertensive while admitted, will restart losartan and continue holding HCTZ. -Continue holding HCTZ -Continue holding losartan  #Hypokalemia, active Potassium of 3.2 on this morning's labs. -Potassium chloride 53mEq twice daily  #Sciatica, chronic #Fibromyalgia, chronic #Osteoarthritis, chronic -Continue home Norco 0.5-1 tablet Q12H -Acetaminophen Q6H PRN  #Code status: Full code #Diet: Regular, NPO at midnight #VTE ppx: Heparin 5,000 units Q8H #IVF: LR 100cc/hr #PT/OT recs: SNF, supervision/assistance 24 hours. Equipment: wheelchair, 3in1  Cato Mulligan, MD 01/28/2020, 11:11 AM Pager: [phone] After 5pm on weekdays and 1pm on weekends: On Call pager [phone]

## 2020-01-28 NOTE — Progress Notes (Signed)
Initial Nutrition Assessment  DOCUMENTATION CODES:   Obesity unspecified, Non-severe (moderate) malnutrition in context of chronic illness  INTERVENTION:   -Ensure Enlive po TID, each supplement provides 350 kcal and 20 grams of protein -MVI with minerals daily -Magic cup TID with meals, each supplement provides 290 kcal and 9 grams of protein  NUTRITION DIAGNOSIS:   Moderate Malnutrition related to chronic illness (esophageal dysmotility) as evidenced by energy intake < or equal to 75% for > or equal to 1 month, mild fat depletion, moderate fat depletion, mild muscle depletion, moderate muscle depletion  GOAL:   Patient will meet greater than or equal to 90% of their needs  MONITOR:   PO intake, Supplement acceptance, Labs, Weight trends, Skin, I & O's  REASON FOR ASSESSMENT:   Consult Assessment of nutrition requirement/status, Poor PO  ASSESSMENT:   Ms Mondry is 74 year old female with history of acute encephalopathy, cervical cancer s/p hysterectomy, chronic constipation, GERD, seizure, and chronic back pain on chronic opiate therapy presenting for weakness and loss of appetite with associated weight loss, found to have an AKI and electrolyte abnormalities.  Pt admitted with AKI, loss of appetite, and weight loss.   11/28- s/p BSE- regular diet with thin liquids  Reviewed I/O's: + 0 ml x 24 hours and +1.1 L since admission  Spoke with pt and daughter Rolan Lipa) at bedside. Pt reports feeling weak after breakfast and deferred most of the interview to daughter. Per Rolan Lipa, pt has experienced a general decline in health over the past two months. Pt with increasing difficulty swallowing, poor oral intake, and decreased functional mobility. At baseline, pt is active, eats well, and does not use any assistive devices. Per daughter, pt now ambulates with a walker and has to take frequent breakfast secondary to fatigue. Additionally, pt has been consuming mostly bites and sips at  meals over the past 2-3 weeks. Pt complains of poor appetite and fatigue when eating, as it takes large amounts of energy to chew and swallow. Observed breakfast tray- pt consumed a few bites of pancake and sausage; per daughter this is typical intake over the past 2-3 weeks.   Reviewed wt hx; wt has been stable over the past month. Noted no distant history of weight loss. Per daughter, she endorses at least a 20 pound weight loss over the past 2 months. Pt UBW is 199# and pt weighed 177# after MD appointment in October. Daughter suspects further weight loss secondary to nutritional decline.   Per daughter, plan for EGD with dilation tomorrow and is hopeful that this will improve pt's intake and swallow function. She reports extreme concern over pt's decline in health. She shared pictures of pt with this RD, which were taken over the summer; pt with significant change in appearance, including noticeable fat loss when comparing pictures to pt's current appearance.  RD discussed ways to help pt increase calories and protein in diet. Pt amenable to Ensure and Magic Cups.  Medications reviewed and include remeron.  Labs reviewed: K: 3.2.   NUTRITION - FOCUSED PHYSICAL EXAM:    Most Recent Value  Orbital Region Moderate depletion  Upper Arm Region Mild depletion  Thoracic and Lumbar Region No depletion  Buccal Region Mild depletion  Temple Region Moderate depletion  Clavicle Bone Region No depletion  Clavicle and Acromion Bone Region No depletion  Scapular Bone Region No depletion  Dorsal Hand Moderate depletion  Patellar Region Moderate depletion  Anterior Thigh Region Moderate depletion  Posterior Calf Region  Moderate depletion  Edema (RD Assessment) None  Hair Reviewed  Eyes Reviewed  Mouth Reviewed  Skin Reviewed  Nails Reviewed       Diet Order:   Diet Order            Diet NPO time specified  Diet effective midnight           Diet regular Room service appropriate? Yes;  Fluid consistency: Thin  Diet effective now                 EDUCATION NEEDS:   Education needs have been addressed  Skin:  Skin Assessment: Reviewed RN Assessment  Last BM:  01/25/20  Height:   Ht Readings from Last 1 Encounters:  01/26/20 5\' 5"  (1.651 m)    Weight:   Wt Readings from Last 1 Encounters:  01/28/20 85 kg    Ideal Body Weight:  56.8 kg  BMI:  Body mass index is 31.18 kg/m.  Estimated Nutritional Needs:   Kcal:  1800-2000  Protein:  110-125 grams  Fluid:  > 1.8 L    Loistine Chance, RD, LDN, [place] Registered Dietitian II Certified Diabetes Care and Education Specialist Please refer to [facility] for RD and/or RD on-call/weekend/after hours pager

## 2020-01-28 NOTE — Consult Note (Signed)
Consultation  Referring Provider: Dr. Rebeca Alert    Primary Care Physician:  [facility] Primary Gastroenterologist:   Dr. Ardis Hughs      Reason for Consultation: Dysphagia             HPI:   Heather Horton is a 74 y.o. female with a past medical history as listed below including acute encephalopathy, cervical cancer status post hysterectomy, chronic constipation, chronic hepatitis C, GERD, seizure and chronic back pain on chronic opiate therapy who presented to the ER initially on 01/26/2020 for a loss of appetite, weakness and weight loss.  We are consulted in regards to dysphagia.    At time of admission daughter reported the Heather Horton not eating well for the past few months and developed significant weakness when trying to get up and walk.  In turn related to significant weight loss.  Apparently also having difficulty with memory which was gradually worsening.    01/27/2020 Heather Horton evaluated by speech pathology after reporting that liquids and solids "get stuck and will not go down".  At that time all liquids and solids went down with no symptoms of oropharyngeal dysphagia but Heather Horton reported that liquids were "sticking".  They report the Heather Horton's symptoms appeared to be esophageal in nature and they recommended esophagram and/or GI consult.    Today, Heather Horton found with her daughter by her bedside, who does assist with history.  She helps to explain the Heather Horton has really had no appetite and been unable to eat more than a bite or 2 of pretty much anything for the past 2 months, due to this has had increasing weakness.  Heather Horton tells me she just does not have an appetite.  Most recently over the past few weeks to a month Heather Horton notes that she has also had trouble swallowing even liquids which seem to get stuck in her throat on the way down.  They explained the Heather Horton did have a previous EGD in [place] "years ago", where she had a stricture dilated for the same problem.  Heather Horton reports  chronic reflux for which she is on Pantoprazole at home, does report some breakthrough symptoms.    Denies fever, chills, abdominal pain or blood in her stool.  ER course: CBC with a WBC 11.1, hemoglobin 10.2, COVID-19 negative, FOBT negative  GI history: 05/15/2018 colonoscopy Dr. Ardis Hughs: With diverticulosis and otherwise normal but a history of multiple adenomas and repeat was recommended in 5 years  Past Medical History:  Diagnosis Date  . Acute encephalopathy   . Allergy   . Anemia   . Anxiety   . Arthritis   . Asthma   . Blood transfusion without reported diagnosis   . Bronchitis   . Bursitis   . Cervical cancer ([place])   . Chronic constipation   . Chronic hepatitis C without hepatic coma ([place]) 10/17/2014   11/10/2015 visit with Dr. Linus Salmons. Repeat labs revealed resolution of infection following treatment.  [place], Stacy, FNP-C; last seen 01/2014 Genotype 1b Diagnosed 01/2013  . Colon polyps   . Fibrocystic breast disease   . Galactorrhea on right side   . GERD (gastroesophageal reflux disease)   . Hepatitis C   . Hyperlipidemia   . Hypertension   . Insomnia   . Osteoporosis   . Respiratory failure ([place]) 03/20/2017  . Seizures ([place])     Past Surgical History:  Procedure Laterality Date  . ABDOMINAL HYSTERECTOMY  30's   due  to cervical cancer  . APPENDECTOMY      Family History  Problem Relation Age of Onset  . Asthma Mother   . Colon cancer Sister 92  . Stroke Brother 34  . Stroke Sister 58  . Cancer Sister 52       lung cancer, +TOBACCO  . Arthritis Daughter   . Pulmonary embolism Daughter 67       on chronic warfarin  . Hypertension Son 71  . Esophageal cancer Neg Hx   . Rectal cancer Neg Hx   . Stomach cancer Neg Hx     Social History   Tobacco Use  . Smoking status: Current Every Day Smoker    Packs/day: 1.00    Years: 40.00    Pack years: 40.00    Types: Cigarettes  . Smokeless tobacco: Never Used   . Tobacco comment: I'm scared of e-cigarettes, interested in patches, tobacco info given 01/10/15  Vaping Use  . Vaping Use: Never used  Substance Use Topics  . Alcohol use: Yes    Alcohol/week: 14.0 - 18.0 standard drinks    Types: 14 - 18 Standard drinks or equivalent per week    Comment: I love my brandy, I'm gonna drink a couple of drinks each evening, it helps me sleep  . Drug use: No    Prior to Admission medications   Medication Sig Start Date End Date Taking? Authorizing Provider  albuterol (PROVENTIL HFA;VENTOLIN HFA) 108 (90 Base) MCG/ACT inhaler Inhale 2 puffs into the lungs every 6 (six) hours as needed for wheezing. 04/08/17  Yes Jeffery, Chelle, PA  atorvastatin (LIPITOR) 10 MG tablet Take 1 tablet (10 mg total) by mouth daily. 04/30/17  Yes [name], PA  ergocalciferol (VITAMIN D2) 1.25 MG (50000 UT) capsule Take 50,000 Units by mouth every Wednesday.  12/28/19  Yes [provider]  hydrochlorothiazide (HYDRODIURIL) 25 MG tablet Take 25 mg by mouth daily.  01/13/18  Yes [provider]  HYDROcodone-acetaminophen (NORCO) 10-325 MG tablet Take 0.5-1 tablets by mouth in the morning and at bedtime.  12/28/19 12/27/20 Yes [provider]  hydrOXYzine (ATARAX/VISTARIL) 10 MG tablet Take 10 mg by mouth daily as needed for anxiety.  01/13/18  Yes [provider]  ketoconazole (NIZORAL) 2 % cream Apply 1 application topically as needed for irritation.  12/09/17  Yes [provider]  levETIRAcetam (KEPPRA) 500 MG tablet TAKE 1 TABLET BY MOUTH TWICE A DAY Heather Horton taking differently: Take 500 mg by mouth 2 (two) times daily.  04/09/19  Yes Cameron Sprang, MD  loratadine (CLARITIN) 10 MG tablet Take 10 mg by mouth daily as needed for allergies or rhinitis.   Yes [provider]  losartan (COZAAR) 100 MG tablet Take 100 mg by mouth daily.  01/13/18  Yes [provider]  mirtazapine (REMERON) 7.5 MG tablet Take 7.5 mg by mouth  at bedtime.    Yes [provider]  pantoprazole (PROTONIX) 40 MG tablet TAKE 1 TABLET BY MOUTH DAILY AT 10 PM. Heather Horton taking differently: Take 40 mg by mouth at bedtime.  09/22/17  Yes Rutherford Guys, MD  QUEtiapine (SEROQUEL) 300 MG tablet Take 1 tablet (300 mg total) by mouth at bedtime. 07/29/17  Yes Jeffery, Domingo Mend, PA  tetrahydrozoline-zinc (VISINE-AC) 0.05-0.25 % ophthalmic solution Place 1-2 drops into both eyes 3 (three) times daily as needed (for irritation).    Yes [provider]  diazepam (VALIUM) 5 MG tablet Take 1 tablet 30 minutes prior to MRI.  May take second dose if needed. Heather Horton taking differently: Take 5 mg by mouth See admin instructions. Take 5 mg by mouth 30 minutes prior to MRI and may take second dose if needed. 01/22/20   Cameron Sprang, MD  folic acid (FOLVITE) 338 MCG tablet Take 800 mcg by mouth in the morning.    [provider]    Current Facility-Administered Medications  Medication Dose Route Frequency Provider Last Rate Last Admin  . acetaminophen (TYLENOL) tablet 650 mg  650 mg Oral Q6H PRN Asencion Noble, MD   650 mg at 01/27/20 0026   Or  . acetaminophen (TYLENOL) suppository 650 mg  650 mg Rectal Q6H PRN Asencion Noble, MD      . heparin injection 5,000 Units  5,000 Units Subcutaneous Q8H Asencion Noble, MD   5,000 Units at 01/28/20 [phone]  . HYDROcodone-acetaminophen (NORCO) 10-325 MG per tablet 0.5-1 tablet  0.5-1 tablet Oral Q12H PRN Mitzi Hansen, MD   1 tablet at 01/28/20 3976  . lactated ringers infusion   Intravenous Continuous Mitzi Hansen, MD 100 mL/hr at 01/27/20 1244 New Bag at 01/27/20 1244  . levETIRAcetam (KEPPRA) tablet 500 mg  500 mg Oral BID Lonia Skinner M, MD   500 mg at 01/27/20 2108  . mirtazapine (REMERON) tablet 7.5 mg  7.5 mg Oral QHS Asencion Noble, MD   7.5 mg at 01/27/20 2108  . pantoprazole (PROTONIX) EC tablet 40 mg  40 mg Oral QHS Asencion Noble, MD   40 mg at 01/27/20  2108  . potassium chloride SA (KLOR-CON) CR tablet 40 mEq  40 mEq Oral BID Christian, Rylee, MD      . QUEtiapine (SEROQUEL) tablet 300 mg  300 mg Oral QHS Lonia Skinner M, MD   300 mg at 01/27/20 2108    Allergies as of 01/26/2020 - Review Complete 01/26/2020  Allergen Reaction Noted  . Molds & smuts Other (See Comments) 04/17/2015  . Pollen extract Other (See Comments) 04/17/2015  . Aspirin Nausea And Vomiting and Rash 11/19/2011     Review of Systems:    Constitutional: No weight loss, fever or chills Skin: No rash  Cardiovascular: No chest pain, chest pressure or palpitations   Respiratory: No SOB  Gastrointestinal: See HPI and otherwise negative Genitourinary: No dysuria Neurological: No headache, dizziness or syncope Musculoskeletal: No new muscle or joint pain Hematologic: No bleeding or bruising Psychiatric: No history of depression or anxiety    Physical Exam:  Vital signs in last 24 hours: Temp:  [98.2 F (36.8 C)-98.9 F (37.2 C)] 98.9 F (37.2 C) (11/29 0606) Pulse Rate:  [94-100] 100 (11/29 0606) Resp:  [16] 16 (11/29 0606) BP: (124-151)/(72-85) 124/72 (11/29 0606) SpO2:  [100 %] 100 % (11/29 0606) Weight:  [85 kg] 85 kg (11/29 0500) Last BM Date: 01/25/20 General:   Pleasant AA female appears to be in NAD, Well developed, Well nourished, alert and cooperative Head:  Normocephalic and atraumatic. Eyes:   PEERL, EOMI. No icterus. Conjunctiva pink. Ears:  Normal auditory acuity. Neck:  Supple Throat: Oral cavity and pharynx without inflammation, swelling or lesion.  Lungs: Respirations even and unlabored. Lungs clear to auscultation bilaterally.   No wheezes, crackles, or rhonchi.  Heart: Normal S1, S2. No MRG. Regular rate and rhythm. No peripheral edema, cyanosis or pallor.  Abdomen:  Soft, nondistended, mild epigastric ttp. No rebound or guarding. Normal bowel sounds. No appreciable masses or hepatomegaly. Rectal:  Not performed.  Msk:  Symmetrical  without gross deformities. Peripheral pulses intact.  Extremities:  Without edema, no deformity or joint abnormality.  Neurologic:  Alert and  oriented x4;  grossly normal neurologically.  Skin:   Dry and intact without significant lesions or rashes. Psychiatric: Demonstrates good judgement and reason without abnormal affect or behaviors.  LAB RESULTS: Recent Labs    01/26/20 1808 01/26/20 1808 01/26/20 1914 01/27/20 0458 01/28/20 0131  WBC 11.1*  --   --  9.8 8.0  HGB 10.2*   < > 11.9* 9.6* 8.8*  HCT 30.8*   < > 35.0* 28.0* 25.7*  PLT 271  --   --  258 246   < > = values in this interval not displayed.   BMET Recent Labs    01/26/20 1808 01/26/20 1808 01/26/20 1914 01/27/20 0458 01/28/20 0131  NA 133*   < > 135 135 136  K 3.3*   < > 3.6 2.8* 3.2*  CL 100  --   --  103 107  CO2 19*  --   --  19* 18*  GLUCOSE 129*  --   --  109* 102*  BUN 22  --   --  16 11  CREATININE 2.93*  --   --  1.34* 0.74  CALCIUM 9.2  --   --  8.8* 9.0   < > = values in this interval not displayed.   LFT Recent Labs    01/27/20 0458  PROT 6.1*  ALBUMIN 2.8*  AST 19  ALT 17  ALKPHOS 54  BILITOT 0.6    STUDIES: CT Head Wo Contrast  Result Date: 01/26/2020 CLINICAL DATA:  Weakness and brief periods of unresponsiveness. Chronic neck pain. EXAM: CT HEAD WITHOUT CONTRAST CT CERVICAL SPINE WITHOUT CONTRAST TECHNIQUE: Multidetector CT imaging of the head and cervical spine was performed following the standard protocol without intravenous contrast. Multiplanar CT image reconstructions of the cervical spine were also generated. COMPARISON:  03/20/2017, 01/19/2020 FINDINGS: CT HEAD FINDINGS Brain: No evidence of acute infarction, hemorrhage, hydrocephalus, extra-axial collection or mass lesion/mass effect. Vascular: Atherosclerotic calcifications involving the large vessels of the skull base. No unexpected hyperdense vessel. Skull: Normal. Negative for fracture or focal lesion. Sinuses/Orbits: No  acute finding. Other: None. CT CERVICAL SPINE FINDINGS Alignment: Facet joints are aligned without dislocation or traumatic listhesis. Dens and lateral masses are aligned. Skull base and vertebrae: No acute fracture. No primary bone lesion or focal pathologic process. Soft tissues and spinal canal: No prevertebral fluid or swelling. No visible canal hematoma. Disc levels: Degenerative disc disease most pronounced at C5-6 and C6-7. Disc protrusions are evident at multiple levels. Mild multilevel bilateral facet arthropathy most pronounced at C7-T1. Prominent right-sided uncovertebral spurring C5-6. Overall, no appreciable interval change from prior. Upper chest: Mild paraseptal emphysema within the visualized right lung apex. Other: Bilateral carotid atherosclerosis. IMPRESSION: 1. No acute intracranial findings. 2. No acute fracture or traumatic listhesis of the cervical spine. 3. Degenerative disc disease and facet arthropathy of the cervical spine, similar to prior. Electronically Signed   By: Davina Poke D.O.   On: 01/26/2020 18:51   CT CHEST WO CONTRAST  Result Date: 01/27/2020 CLINICAL DATA:  Increasing generalized weakness. Assessing for malignancy. EXAM: CT CHEST WITHOUT CONTRAST TECHNIQUE: Multidetector CT imaging of the chest was performed following the standard protocol without IV contrast. COMPARISON:  Chest radiograph dated 03/21/2017. FINDINGS: Cardiovascular: Vascular calcifications are seen in the coronary arteries and aortic arch. Normal heart size. No pericardial effusion. Mediastinum/Nodes: No enlarged mediastinal or axillary  lymph nodes. Thyroid gland, trachea, and esophagus demonstrate no significant findings. An indistinct density is seen lateral to the left nipple, directly beneath the skin. Lungs/Pleura: Mild bibasilar dependent atelectasis. No suspicious pulmonary nodule. No pleural effusion or pneumothorax. Upper Abdomen: No acute abnormality. Musculoskeletal: No chest wall mass or  suspicious bone lesions identified. IMPRESSION: 1. Indistinct density lateral to the left nipple, directly beneath the skin likely represents normal fibroglandular tissue given that fibroglandular tissue was seen in this location on prior mammograms. However, given that it has been greater than 1 year since the Heather Horton's most recent screening mammogram, recommend bilateral diagnostic mammogram to evaluate this area. 2. No suspicious findings in the lung. Aortic Atherosclerosis (ICD10-I70.0). Electronically Signed   By: Zerita Boers M.D.   On: 01/27/2020 14:06   CT Cervical Spine Wo Contrast  Result Date: 01/26/2020 CLINICAL DATA:  Weakness and brief periods of unresponsiveness. Chronic neck pain. EXAM: CT HEAD WITHOUT CONTRAST CT CERVICAL SPINE WITHOUT CONTRAST TECHNIQUE: Multidetector CT imaging of the head and cervical spine was performed following the standard protocol without intravenous contrast. Multiplanar CT image reconstructions of the cervical spine were also generated. COMPARISON:  03/20/2017, 01/19/2020 FINDINGS: CT HEAD FINDINGS Brain: No evidence of acute infarction, hemorrhage, hydrocephalus, extra-axial collection or mass lesion/mass effect. Vascular: Atherosclerotic calcifications involving the large vessels of the skull base. No unexpected hyperdense vessel. Skull: Normal. Negative for fracture or focal lesion. Sinuses/Orbits: No acute finding. Other: None. CT CERVICAL SPINE FINDINGS Alignment: Facet joints are aligned without dislocation or traumatic listhesis. Dens and lateral masses are aligned. Skull base and vertebrae: No acute fracture. No primary bone lesion or focal pathologic process. Soft tissues and spinal canal: No prevertebral fluid or swelling. No visible canal hematoma. Disc levels: Degenerative disc disease most pronounced at C5-6 and C6-7. Disc protrusions are evident at multiple levels. Mild multilevel bilateral facet arthropathy most pronounced at C7-T1. Prominent  right-sided uncovertebral spurring C5-6. Overall, no appreciable interval change from prior. Upper chest: Mild paraseptal emphysema within the visualized right lung apex. Other: Bilateral carotid atherosclerosis. IMPRESSION: 1. No acute intracranial findings. 2. No acute fracture or traumatic listhesis of the cervical spine. 3. Degenerative disc disease and facet arthropathy of the cervical spine, similar to prior. Electronically Signed   By: Davina Poke D.O.   On: 01/26/2020 18:51   MR BRAIN WO CONTRAST  Result Date: 01/27/2020 CLINICAL DATA:  Memory loss. EXAM: MRI HEAD WITHOUT CONTRAST TECHNIQUE: Multiplanar, multiecho pulse sequences of the brain and surrounding structures were obtained without intravenous contrast. COMPARISON:  Head CT 01/26/2020 and MRI 03/21/2017 FINDINGS: The Heather Horton was unable to tolerate the examination. A complete noncontrast examination was obtained with the exception of axial T1 and coronal T2 sequences. No contrast was administered, and there is intermittent mild to moderate motion artifact. Brain: There is no evidence of an acute infarct, intracranial hemorrhage, mass, midline shift, or extra-axial fluid collection. Mild cerebral atrophy is within normal limits for age. No significant white matter disease is seen for age. Vascular: Major intracranial vascular flow voids are preserved. Skull and upper cervical spine: Unremarkable bone marrow signal. Sinuses/Orbits: Unremarkable orbits. Paranasal sinuses and mastoid air cells are clear. Other: None. IMPRESSION: Unremarkable appearance of the brain for age on this incomplete, motion degraded examination. Electronically Signed   By: Logan Bores M.D.   On: 01/27/2020 14:20     Impression / Plan:   Impression: 1.  Dysphagia: For the past month or so per Heather Horton, evaluated by speech pathology yesterday with  recommendations for GI eval for possible oropharyngeal source, history of a stricture dilated "years ago"; consider  stricture versus other 2.  GERD: Chronic for the Heather Horton, some breakthrough even with her Pantoprazole 40 mg nightly 3.  Decrease in appetite: Heather Horton reports a weight loss of around 20 pounds over the past couple of months 4.  Weakness: Likely related to above 5.  Dementia 6.  AKI on CKD 3  Plan: 1.  Plan for EGD with dilation tomorrow with Dr. Rush Landmark.  Did discuss risks, benefits, limitations and alternatives and Heather Horton agrees to proceed. 2.  Will increase Pantoprazole to 40 mg IV twice daily for now 3.  We will make Heather Horton n.p.o. after midnight 4.  Please await any further recommendations from Dr. Rush Landmark later today.  Thank you for your kind consultation, we will continue to follow.  Lavone Nian Arick Mareno  01/28/2020, 9:09 AM

## 2020-01-28 NOTE — H&P (View-Only) (Signed)
Consultation  Referring Provider: Dr. Rebeca Alert    Primary Care Physician:  [facility] Primary Gastroenterologist:   Dr. Ardis Hughs      Reason for Consultation: Dysphagia             HPI:   Heather Horton is a 74 y.o. female with a past medical history as listed below including acute encephalopathy, cervical cancer status post hysterectomy, chronic constipation, chronic hepatitis C, GERD, seizure and chronic back pain on chronic opiate therapy who presented to the ER initially on 01/26/2020 for a loss of appetite, weakness and weight loss.  We are consulted in regards to dysphagia.    At time of admission daughter reported the patient not eating well for the past few months and developed significant weakness when trying to get up and walk.  In turn related to significant weight loss.  Apparently also having difficulty with memory which was gradually worsening.    01/27/2020 patient evaluated by speech pathology after reporting that liquids and solids "get stuck and will not go down".  At that time all liquids and solids went down with no symptoms of oropharyngeal dysphagia but patient reported that liquids were "sticking".  They report the patient's symptoms appeared to be esophageal in nature and they recommended esophagram and/or GI consult.    Today, patient found with her daughter by her bedside, who does assist with history.  She helps to explain the patient has really had no appetite and been unable to eat more than a bite or 2 of pretty much anything for the past 2 months, due to this has had increasing weakness.  Patient tells me she just does not have an appetite.  Most recently over the past few weeks to a month patient notes that she has also had trouble swallowing even liquids which seem to get stuck in her throat on the way down.  They explained the patient did have a previous EGD in [place] "years ago", where she had a stricture dilated for the same problem.  Patient reports  chronic reflux for which she is on Pantoprazole at home, does report some breakthrough symptoms.    Denies fever, chills, abdominal pain or blood in her stool.  ER course: CBC with a WBC 11.1, hemoglobin 10.2, COVID-19 negative, FOBT negative  GI history: 05/15/2018 colonoscopy Dr. Ardis Hughs: With diverticulosis and otherwise normal but a history of multiple adenomas and repeat was recommended in 5 years  Past Medical History:  Diagnosis Date  . Acute encephalopathy   . Allergy   . Anemia   . Anxiety   . Arthritis   . Asthma   . Blood transfusion without reported diagnosis   . Bronchitis   . Bursitis   . Cervical cancer ([place])   . Chronic constipation   . Chronic hepatitis C without hepatic coma ([place]) 10/17/2014   11/10/2015 visit with Dr. Linus Salmons. Repeat labs revealed resolution of infection following treatment.  [place], FNP-C; last seen 01/2014 Genotype 1b Diagnosed 01/2013  . Colon polyps   . Fibrocystic breast disease   . Galactorrhea on right side   . GERD (gastroesophageal reflux disease)   . Hepatitis C   . Hyperlipidemia   . Hypertension   . Insomnia   . Osteoporosis   . Respiratory failure ([place]) 03/20/2017  . Seizures ([place])     Past Surgical History:  Procedure Laterality Date  . ABDOMINAL HYSTERECTOMY  30's   due  to cervical cancer  . APPENDECTOMY      Family History  Problem Relation Age of Onset  . Asthma Mother   . Colon cancer Sister 90  . Stroke Brother 48  . Stroke Sister 80  . Cancer Sister 16       lung cancer, +TOBACCO  . Arthritis Daughter   . Pulmonary embolism Daughter 54       on chronic warfarin  . Hypertension Son 70  . Esophageal cancer Neg Hx   . Rectal cancer Neg Hx   . Stomach cancer Neg Hx     Social History   Tobacco Use  . Smoking status: Current Every Day Smoker    Packs/day: 1.00    Years: 40.00    Pack years: 40.00    Types: Cigarettes  . Smokeless tobacco: Never Used   . Tobacco comment: I'm scared of e-cigarettes, interested in patches, tobacco info given 01/10/15  Vaping Use  . Vaping Use: Never used  Substance Use Topics  . Alcohol use: Yes    Alcohol/week: 14.0 - 18.0 standard drinks    Types: 14 - 18 Standard drinks or equivalent per week    Comment: I love my brandy, I'm gonna drink a couple of drinks each evening, it helps me sleep  . Drug use: No    Prior to Admission medications   Medication Sig Start Date End Date Taking? Authorizing Provider  albuterol (PROVENTIL HFA;VENTOLIN HFA) 108 (90 Base) MCG/ACT inhaler Inhale 2 puffs into the lungs every 6 (six) hours as needed for wheezing. 04/08/17  Yes Jeffery, Chelle, PA  atorvastatin (LIPITOR) 10 MG tablet Take 1 tablet (10 mg total) by mouth daily. 04/30/17  Yes [name], PA  ergocalciferol (VITAMIN D2) 1.25 MG (50000 UT) capsule Take 50,000 Units by mouth every Wednesday.  12/28/19  Yes [provider]  hydrochlorothiazide (HYDRODIURIL) 25 MG tablet Take 25 mg by mouth daily.  01/13/18  Yes [provider]  HYDROcodone-acetaminophen (NORCO) 10-325 MG tablet Take 0.5-1 tablets by mouth in the morning and at bedtime.  12/28/19 12/27/20 Yes [provider]  hydrOXYzine (ATARAX/VISTARIL) 10 MG tablet Take 10 mg by mouth daily as needed for anxiety.  01/13/18  Yes [provider]  ketoconazole (NIZORAL) 2 % cream Apply 1 application topically as needed for irritation.  12/09/17  Yes [provider]  levETIRAcetam (KEPPRA) 500 MG tablet TAKE 1 TABLET BY MOUTH TWICE A DAY Patient taking differently: Take 500 mg by mouth 2 (two) times daily.  04/09/19  Yes Cameron Sprang, MD  loratadine (CLARITIN) 10 MG tablet Take 10 mg by mouth daily as needed for allergies or rhinitis.   Yes [provider]  losartan (COZAAR) 100 MG tablet Take 100 mg by mouth daily.  01/13/18  Yes [provider]  mirtazapine (REMERON) 7.5 MG tablet Take 7.5 mg by mouth  at bedtime.    Yes [provider]  pantoprazole (PROTONIX) 40 MG tablet TAKE 1 TABLET BY MOUTH DAILY AT 10 PM. Patient taking differently: Take 40 mg by mouth at bedtime.  09/22/17  Yes Rutherford Guys, MD  QUEtiapine (SEROQUEL) 300 MG tablet Take 1 tablet (300 mg total) by mouth at bedtime. 07/29/17  Yes Jeffery, Domingo Mend, PA  tetrahydrozoline-zinc (VISINE-AC) 0.05-0.25 % ophthalmic solution Place 1-2 drops into both eyes 3 (three) times daily as needed (for irritation).    Yes [provider]  diazepam (VALIUM) 5 MG tablet Take 1 tablet 30 minutes prior to MRI.  May take second dose if needed. Patient taking differently: Take 5 mg by mouth See admin instructions. Take 5 mg by mouth 30 minutes prior to MRI and may take second dose if needed. 01/22/20   Cameron Sprang, MD  folic acid (FOLVITE) 093 MCG tablet Take 800 mcg by mouth in the morning.    [provider]    Current Facility-Administered Medications  Medication Dose Route Frequency Provider Last Rate Last Admin  . acetaminophen (TYLENOL) tablet 650 mg  650 mg Oral Q6H PRN Asencion Noble, MD   650 mg at 01/27/20 0026   Or  . acetaminophen (TYLENOL) suppository 650 mg  650 mg Rectal Q6H PRN Asencion Noble, MD      . heparin injection 5,000 Units  5,000 Units Subcutaneous Q8H Asencion Noble, MD   5,000 Units at 01/28/20 [phone]  . HYDROcodone-acetaminophen (NORCO) 10-325 MG per tablet 0.5-1 tablet  0.5-1 tablet Oral Q12H PRN Mitzi Hansen, MD   1 tablet at 01/28/20 2458  . lactated ringers infusion   Intravenous Continuous Mitzi Hansen, MD 100 mL/hr at 01/27/20 1244 New Bag at 01/27/20 1244  . levETIRAcetam (KEPPRA) tablet 500 mg  500 mg Oral BID Lonia Skinner M, MD   500 mg at 01/27/20 2108  . mirtazapine (REMERON) tablet 7.5 mg  7.5 mg Oral QHS Asencion Noble, MD   7.5 mg at 01/27/20 2108  . pantoprazole (PROTONIX) EC tablet 40 mg  40 mg Oral QHS Asencion Noble, MD   40 mg at 01/27/20  2108  . potassium chloride SA (KLOR-CON) CR tablet 40 mEq  40 mEq Oral BID Christian, Rylee, MD      . QUEtiapine (SEROQUEL) tablet 300 mg  300 mg Oral QHS Lonia Skinner M, MD   300 mg at 01/27/20 2108    Allergies as of 01/26/2020 - Review Complete 01/26/2020  Allergen Reaction Noted  . Molds & smuts Other (See Comments) 04/17/2015  . Pollen extract Other (See Comments) 04/17/2015  . Aspirin Nausea And Vomiting and Rash 11/19/2011     Review of Systems:    Constitutional: No weight loss, fever or chills Skin: No rash  Cardiovascular: No chest pain, chest pressure or palpitations   Respiratory: No SOB  Gastrointestinal: See HPI and otherwise negative Genitourinary: No dysuria Neurological: No headache, dizziness or syncope Musculoskeletal: No new muscle or joint pain Hematologic: No bleeding or bruising Psychiatric: No history of depression or anxiety    Physical Exam:  Vital signs in last 24 hours: Temp:  [98.2 F (36.8 C)-98.9 F (37.2 C)] 98.9 F (37.2 C) (11/29 0606) Pulse Rate:  [94-100] 100 (11/29 0606) Resp:  [16] 16 (11/29 0606) BP: (124-151)/(72-85) 124/72 (11/29 0606) SpO2:  [100 %] 100 % (11/29 0606) Weight:  [85 kg] 85 kg (11/29 0500) Last BM Date: 01/25/20 General:   Pleasant AA female appears to be in NAD, Well developed, Well nourished, alert and cooperative Head:  Normocephalic and atraumatic. Eyes:   PEERL, EOMI. No icterus. Conjunctiva pink. Ears:  Normal auditory acuity. Neck:  Supple Throat: Oral cavity and pharynx without inflammation, swelling or lesion.  Lungs: Respirations even and unlabored. Lungs clear to auscultation bilaterally.   No wheezes, crackles, or rhonchi.  Heart: Normal S1, S2. No MRG. Regular rate and rhythm. No peripheral edema, cyanosis or pallor.  Abdomen:  Soft, nondistended, mild epigastric ttp. No rebound or guarding. Normal bowel sounds. No appreciable masses or hepatomegaly. Rectal:  Not performed.  Msk:  Symmetrical  without gross deformities. Peripheral pulses intact.  Extremities:  Without edema, no deformity or joint abnormality.  Neurologic:  Alert and  oriented x4;  grossly normal neurologically.  Skin:   Dry and intact without significant lesions or rashes. Psychiatric: Demonstrates good judgement and reason without abnormal affect or behaviors.  LAB RESULTS: Recent Labs    01/26/20 1808 01/26/20 1808 01/26/20 1914 01/27/20 0458 01/28/20 0131  WBC 11.1*  --   --  9.8 8.0  HGB 10.2*   < > 11.9* 9.6* 8.8*  HCT 30.8*   < > 35.0* 28.0* 25.7*  PLT 271  --   --  258 246   < > = values in this interval not displayed.   BMET Recent Labs    01/26/20 1808 01/26/20 1808 01/26/20 1914 01/27/20 0458 01/28/20 0131  NA 133*   < > 135 135 136  K 3.3*   < > 3.6 2.8* 3.2*  CL 100  --   --  103 107  CO2 19*  --   --  19* 18*  GLUCOSE 129*  --   --  109* 102*  BUN 22  --   --  16 11  CREATININE 2.93*  --   --  1.34* 0.74  CALCIUM 9.2  --   --  8.8* 9.0   < > = values in this interval not displayed.   LFT Recent Labs    01/27/20 0458  PROT 6.1*  ALBUMIN 2.8*  AST 19  ALT 17  ALKPHOS 54  BILITOT 0.6    STUDIES: CT Head Wo Contrast  Result Date: 01/26/2020 CLINICAL DATA:  Weakness and brief periods of unresponsiveness. Chronic neck pain. EXAM: CT HEAD WITHOUT CONTRAST CT CERVICAL SPINE WITHOUT CONTRAST TECHNIQUE: Multidetector CT imaging of the head and cervical spine was performed following the standard protocol without intravenous contrast. Multiplanar CT image reconstructions of the cervical spine were also generated. COMPARISON:  03/20/2017, 01/19/2020 FINDINGS: CT HEAD FINDINGS Brain: No evidence of acute infarction, hemorrhage, hydrocephalus, extra-axial collection or mass lesion/mass effect. Vascular: Atherosclerotic calcifications involving the large vessels of the skull base. No unexpected hyperdense vessel. Skull: Normal. Negative for fracture or focal lesion. Sinuses/Orbits: No  acute finding. Other: None. CT CERVICAL SPINE FINDINGS Alignment: Facet joints are aligned without dislocation or traumatic listhesis. Dens and lateral masses are aligned. Skull base and vertebrae: No acute fracture. No primary bone lesion or focal pathologic process. Soft tissues and spinal canal: No prevertebral fluid or swelling. No visible canal hematoma. Disc levels: Degenerative disc disease most pronounced at C5-6 and C6-7. Disc protrusions are evident at multiple levels. Mild multilevel bilateral facet arthropathy most pronounced at C7-T1. Prominent right-sided uncovertebral spurring C5-6. Overall, no appreciable interval change from prior. Upper chest: Mild paraseptal emphysema within the visualized right lung apex. Other: Bilateral carotid atherosclerosis. IMPRESSION: 1. No acute intracranial findings. 2. No acute fracture or traumatic listhesis of the cervical spine. 3. Degenerative disc disease and facet arthropathy of the cervical spine, similar to prior. Electronically Signed   By: Davina Poke D.O.   On: 01/26/2020 18:51   CT CHEST WO CONTRAST  Result Date: 01/27/2020 CLINICAL DATA:  Increasing generalized weakness. Assessing for malignancy. EXAM: CT CHEST WITHOUT CONTRAST TECHNIQUE: Multidetector CT imaging of the chest was performed following the standard protocol without IV contrast. COMPARISON:  Chest radiograph dated 03/21/2017. FINDINGS: Cardiovascular: Vascular calcifications are seen in the coronary arteries and aortic arch. Normal heart size. No pericardial effusion. Mediastinum/Nodes: No enlarged mediastinal or axillary  lymph nodes. Thyroid gland, trachea, and esophagus demonstrate no significant findings. An indistinct density is seen lateral to the left nipple, directly beneath the skin. Lungs/Pleura: Mild bibasilar dependent atelectasis. No suspicious pulmonary nodule. No pleural effusion or pneumothorax. Upper Abdomen: No acute abnormality. Musculoskeletal: No chest wall mass or  suspicious bone lesions identified. IMPRESSION: 1. Indistinct density lateral to the left nipple, directly beneath the skin likely represents normal fibroglandular tissue given that fibroglandular tissue was seen in this location on prior mammograms. However, given that it has been greater than 1 year since the patient's most recent screening mammogram, recommend bilateral diagnostic mammogram to evaluate this area. 2. No suspicious findings in the lung. Aortic Atherosclerosis (ICD10-I70.0). Electronically Signed   By: Zerita Boers M.D.   On: 01/27/2020 14:06   CT Cervical Spine Wo Contrast  Result Date: 01/26/2020 CLINICAL DATA:  Weakness and brief periods of unresponsiveness. Chronic neck pain. EXAM: CT HEAD WITHOUT CONTRAST CT CERVICAL SPINE WITHOUT CONTRAST TECHNIQUE: Multidetector CT imaging of the head and cervical spine was performed following the standard protocol without intravenous contrast. Multiplanar CT image reconstructions of the cervical spine were also generated. COMPARISON:  03/20/2017, 01/19/2020 FINDINGS: CT HEAD FINDINGS Brain: No evidence of acute infarction, hemorrhage, hydrocephalus, extra-axial collection or mass lesion/mass effect. Vascular: Atherosclerotic calcifications involving the large vessels of the skull base. No unexpected hyperdense vessel. Skull: Normal. Negative for fracture or focal lesion. Sinuses/Orbits: No acute finding. Other: None. CT CERVICAL SPINE FINDINGS Alignment: Facet joints are aligned without dislocation or traumatic listhesis. Dens and lateral masses are aligned. Skull base and vertebrae: No acute fracture. No primary bone lesion or focal pathologic process. Soft tissues and spinal canal: No prevertebral fluid or swelling. No visible canal hematoma. Disc levels: Degenerative disc disease most pronounced at C5-6 and C6-7. Disc protrusions are evident at multiple levels. Mild multilevel bilateral facet arthropathy most pronounced at C7-T1. Prominent  right-sided uncovertebral spurring C5-6. Overall, no appreciable interval change from prior. Upper chest: Mild paraseptal emphysema within the visualized right lung apex. Other: Bilateral carotid atherosclerosis. IMPRESSION: 1. No acute intracranial findings. 2. No acute fracture or traumatic listhesis of the cervical spine. 3. Degenerative disc disease and facet arthropathy of the cervical spine, similar to prior. Electronically Signed   By: Davina Poke D.O.   On: 01/26/2020 18:51   MR BRAIN WO CONTRAST  Result Date: 01/27/2020 CLINICAL DATA:  Memory loss. EXAM: MRI HEAD WITHOUT CONTRAST TECHNIQUE: Multiplanar, multiecho pulse sequences of the brain and surrounding structures were obtained without intravenous contrast. COMPARISON:  Head CT 01/26/2020 and MRI 03/21/2017 FINDINGS: The patient was unable to tolerate the examination. A complete noncontrast examination was obtained with the exception of axial T1 and coronal T2 sequences. No contrast was administered, and there is intermittent mild to moderate motion artifact. Brain: There is no evidence of an acute infarct, intracranial hemorrhage, mass, midline shift, or extra-axial fluid collection. Mild cerebral atrophy is within normal limits for age. No significant white matter disease is seen for age. Vascular: Major intracranial vascular flow voids are preserved. Skull and upper cervical spine: Unremarkable bone marrow signal. Sinuses/Orbits: Unremarkable orbits. Paranasal sinuses and mastoid air cells are clear. Other: None. IMPRESSION: Unremarkable appearance of the brain for age on this incomplete, motion degraded examination. Electronically Signed   By: Logan Bores M.D.   On: 01/27/2020 14:20     Impression / Plan:   Impression: 1.  Dysphagia: For the past month or so per patient, evaluated by speech pathology yesterday with  recommendations for GI eval for possible oropharyngeal source, history of a stricture dilated "years ago"; consider  stricture versus other 2.  GERD: Chronic for the patient, some breakthrough even with her Pantoprazole 40 mg nightly 3.  Decrease in appetite: Patient reports a weight loss of around 20 pounds over the past couple of months 4.  Weakness: Likely related to above 5.  Dementia 6.  AKI on CKD 3  Plan: 1.  Plan for EGD with dilation tomorrow with Dr. Rush Landmark.  Did discuss risks, benefits, limitations and alternatives and patient agrees to proceed. 2.  Will increase Pantoprazole to 40 mg IV twice daily for now 3.  We will make patient n.p.o. after midnight 4.  Please await any further recommendations from Dr. Rush Landmark later today.  Thank you for your kind consultation, we will continue to follow.  Lavone Nian Heather Horton  01/28/2020, 9:09 AM

## 2020-01-28 NOTE — Procedures (Signed)
Patient Name: Heather Horton  MRN: [id number]  Epilepsy Attending: Lora Havens  Referring Physician/Provider: Dr. Mitzi Hansen Date: 01/28/2020 Duration: 23.30 minutes  Patient history: 74 year old female with history of epilepsy, dementia now with visual hallucinations. EEG to evaluate for seizures.  Level of alertness: Awake  AEDs during EEG study: Keppra  Technical aspects: This EEG study was done with scalp electrodes positioned according to the 10-20 International system of electrode placement. Electrical activity was acquired at a sampling rate of 500Hz  and reviewed with a high frequency filter of 70Hz  and a low frequency filter of 1Hz . EEG data were recorded continuously and digitally stored.   Description: The posterior dominant rhythm consists of 7 Hz activity of moderate voltage (25-35 uV) seen predominantly in posterior head regions, symmetric and reactive to eye opening and eye closing. EEG showed continuous generalized predominantly 6 to 7 Hz theta as well as intermittent generalized 2 to 3 Hz delta slowing. Physiologic photic driving was not seen during photic stimulation.  Hyperventilation was not performed.  ABNORMALITY -Continuous slow, generalized  IMPRESSION: This study is suggestive of mild to moderate diffuse encephalopathy, nonspecific etiology. No seizures or epileptiform discharges were seen throughout the recording.  Saysha Menta Barbra Sarks

## 2020-01-29 ENCOUNTER — Encounter (HOSPITAL_COMMUNITY): Payer: Self-pay | Admitting: Internal Medicine

## 2020-01-29 ENCOUNTER — Inpatient Hospital Stay (HOSPITAL_COMMUNITY): Payer: Medicare HMO | Admitting: Certified Registered Nurse Anesthetist

## 2020-01-29 ENCOUNTER — Encounter (HOSPITAL_COMMUNITY)
Admission: EM | Disposition: A | Discharge status: Home Health | Payer: Self-pay | Source: Home / Self Care | Attending: Internal Medicine

## 2020-01-29 HISTORY — PX: SAVORY DILATION: SHX5439

## 2020-01-29 HISTORY — PX: BIOPSY: SHX5522

## 2020-01-29 HISTORY — PX: ESOPHAGOGASTRODUODENOSCOPY (EGD) WITH PROPOFOL: SHX5813

## 2020-01-29 LAB — CBC
HCT: 26.4 % — ABNORMAL LOW (ref 36.0–46.0)
Hemoglobin: 9.1 g/dL — ABNORMAL LOW (ref 12.0–15.0)
MCH: 31.7 pg (ref 26.0–34.0)
MCHC: 34.5 g/dL (ref 30.0–36.0)
MCV: 92 fL (ref 80.0–100.0)
Platelets: 283 10*3/uL (ref 150–400)
RBC: 2.87 MIL/uL — ABNORMAL LOW (ref 3.87–5.11)
RDW: 14.5 % (ref 11.5–15.5)
WBC: 8 10*3/uL (ref 4.0–10.5)
nRBC: 0.3 % — ABNORMAL HIGH (ref 0.0–0.2)

## 2020-01-29 LAB — BASIC METABOLIC PANEL
Anion gap: 12 (ref 5–15)
BUN: 7 mg/dL — ABNORMAL LOW (ref 8–23)
CO2: 18 mmol/L — ABNORMAL LOW (ref 22–32)
Calcium: 9.4 mg/dL (ref 8.9–10.3)
Chloride: 108 mmol/L (ref 98–111)
Creatinine, Ser: 0.76 mg/dL (ref 0.44–1.00)
GFR, Estimated: >60 mL/min (ref >60)
Glucose, Bld: 101 mg/dL — ABNORMAL HIGH (ref 70–99)
Potassium: 4.2 mmol/L (ref 3.5–5.1)
Sodium: 138 mmol/L (ref 135–145)

## 2020-01-29 SURGERY — ESOPHAGOGASTRODUODENOSCOPY (EGD) WITH PROPOFOL
Anesthesia: General

## 2020-01-29 MED ORDER — LIDOCAINE 2% (20 MG/ML) 5 ML SYRINGE
INTRAMUSCULAR | Status: DC | PRN
  Administered 2020-01-29: 40 mg via INTRAVENOUS

## 2020-01-29 MED ORDER — ORAL CARE MOUTH RINSE
15.0000 mL | Freq: Two times a day (BID) | OROMUCOSAL | Status: DC
  Administered 2020-01-29 – 2020-02-04 (×9): 15 mL via OROMUCOSAL

## 2020-01-29 MED ORDER — LOSARTAN POTASSIUM 50 MG PO TABS
100.0000 mg | ORAL_TABLET | Freq: Every day | ORAL | Status: DC
  Administered 2020-01-29 – 2020-02-03 (×6): 100 mg via ORAL
  Filled 2020-01-29 (×6): qty 2

## 2020-01-29 MED ORDER — PROPOFOL 500 MG/50ML IV EMUL
INTRAVENOUS | Status: DC | PRN
  Administered 2020-01-29: 100 ug/kg/min via INTRAVENOUS

## 2020-01-29 SURGICAL SUPPLY — 15 items

## 2020-01-29 NOTE — Anesthesia Preprocedure Evaluation (Signed)
Anesthesia Evaluation  Patient identified by MRN, date of birth, ID band Patient awake    Reviewed: Allergy & Precautions, NPO status , Patient's Chart, lab work & pertinent test results  Airway Mallampati: II  TM Distance: >3 FB Neck ROM: Full    Dental  (+) Missing, Dental Advisory Given,    Pulmonary Current Smoker,    breath sounds clear to auscultation       Cardiovascular hypertension,  Rhythm:Regular Rate:Normal     Neuro/Psych    GI/Hepatic   Endo/Other    Renal/GU      Musculoskeletal   Abdominal   Peds  Hematology   Anesthesia Other Findings   Reproductive/Obstetrics                             Anesthesia Physical Anesthesia Plan  ASA: III  Anesthesia Plan: General   Post-op Pain Management:    Induction: Intravenous  PONV Risk Score and Plan:   Airway Management Planned: Nasal Cannula and Natural Airway  Additional Equipment:   Intra-op Plan:   Post-operative Plan:   Informed Consent: I have reviewed the patients History and Physical, chart, labs and discussed the procedure including the risks, benefits and alternatives for the proposed anesthesia with the patient or authorized representative who has indicated his/her understanding and acceptance.     Dental advisory given  Plan Discussed with: CRNA and Anesthesiologist  Anesthesia Plan Comments:         Anesthesia Quick Evaluation

## 2020-01-29 NOTE — Progress Notes (Signed)
Occupational Therapy Treatment Patient Details Name: Heather Horton MRN: [id number] DOB: 08/03/1945 Today's Date: 01/29/2020    History of present illness Pt is a 74 y/o F presenting to ED on 11/27 for weakness that caused her legs to give out on her and decreased responsiveness. Pt has had increased falls over the past 2 months with loss of appetite/weight loss. Pt found to now have AKI, mild diffuse encepalopathy, and dementia with visual hallucinations. PMH includes hepatitis C, GERD, HTN, hyperlipidemia, prior cervical cancer, chronic cervical and LBP, and seizures (on Keppra).   OT comments  Pt making steady progress towards OT goals this session. Pt continues to present with increased pain ( mostly in BLEs),decreased strength and generalized weakness (BLE>BUE) impacting pts ability to complete BADLs. Pt able to transition from supine <>sitting with MIN- MOD A. Pt completed x2 sit<>stands with RW from EOB with MIN A however pt reports weakness in BLEs noted to experience buckling in knees needing to return to sitting. Pt completed light therex as indicated below to facilitate increased strength and AROM for higher level functional mobility tasks. Pt would continue to benefit from skilled occupational therapy while admitted and after d/c to address the below listed limitations in order to improve overall functional mobility and facilitate independence with BADL participation. DC plan remains appropriate, will follow acutely per POC.     Follow Up Recommendations  SNF;Supervision/Assistance - 24 hour    Equipment Recommendations  None recommended by OT    Recommendations for Other Services      Precautions / Restrictions Precautions Precautions: Fall Restrictions Weight Bearing Restrictions: No       Mobility Bed Mobility Overal bed mobility: Needs Assistance Bed Mobility: Supine to Sit;Sit to Supine     Supine to sit: Min assist;HOB elevated Sit to supine: Mod assist;HOB  elevated   General bed mobility comments: pt required light MIN A to elevate trunk into sitting however pt ablel to maneuver BLES to EOB; MOD A to return BLEs back to bed  Transfers Overall transfer level: Needs assistance Equipment used: Rolling walker (2 wheeled) Transfers: Sit to/from Stand Sit to Stand: Min assist;From elevated surface         General transfer comment: pt completed x2 sit<>stands from EOB with MIN A, cues for hand placement needed each trial. pt noted to experience BLEs weakness with legs buckling after standing ~ 39min each trial needing to return to sitting    Balance Overall balance assessment: Needs assistance Sitting-balance support: Feet supported Sitting balance-Leahy Scale: Good     Standing balance support: Bilateral upper extremity supported Standing balance-Leahy Scale: Poor Standing balance comment: pt with reliance on bil UE support on RW                           ADL either performed or assessed with clinical judgement   ADL Overall ADL's : Needs assistance/impaired                         Toilet Transfer: Minimal assistance;RW (sit<>stand only secondary to BLEs buckling) Toilet Transfer Details (indicate cue type and reason): simulated via functional mobility         Functional mobility during ADLs: Minimal assistance;Rolling walker General ADL Comments: pt limited by weakness in BLES noted to experience buckling upon standing     Vision   Additional Comments: noted improvements in R gaze preference in comparision to previous session  Perception     Praxis      Cognition Arousal/Alertness: Awake/alert Behavior During Therapy: WFL for tasks assessed/performed Overall Cognitive Status: Within Functional Limits for tasks assessed                                 General Comments: pt noted to be easily distracted however overall WFL for simple mobility tasks        Exercises General Exercises  - Lower Extremity Long Arc Quad: AROM;Both;10 reps;Supine Hip ABduction/ADduction: AROM;Both;10 reps Straight Leg Raises: AROM;Both;10 reps;Supine Other Exercises Other Exercises: encouraged pt to use bed rails to pull trunk off of bed to increase strength in BUES and core   Shoulder Instructions       General Comments daughter present    Pertinent Vitals/ Pain       Pain Assessment: Faces Faces Pain Scale: Hurts a little bit Pain Location: BLEs Pain Descriptors / Indicators: Cramping Pain Intervention(s): Limited activity within patient's tolerance;Monitored during session;Repositioned  Home Living                                          Prior Functioning/Environment              Frequency  Min 2X/week        Progress Toward Goals  OT Goals(current goals can now be found in the care plan section)  Progress towards OT goals: Progressing toward goals  Acute Rehab OT Goals Patient Stated Goal: pt unable to state  OT Goal Formulation: With patient/family Time For Goal Achievement: 02/10/20 Potential to Achieve Goals: Good  Plan Discharge plan remains appropriate;Frequency remains appropriate    Co-evaluation                 AM-PAC OT "6 Clicks" Daily Activity     Outcome Measure   Help from another person eating meals?: A Little Help from another person taking care of personal grooming?: A Little Help from another person toileting, which includes using toliet, bedpan, or urinal?: A Lot Help from another person bathing (including washing, rinsing, drying)?: A Lot Help from another person to put on and taking off regular upper body clothing?: A Little Help from another person to put on and taking off regular lower body clothing?: A Lot 6 Click Score: 15    End of Session Equipment Utilized During Treatment: Rolling walker;Gait belt  OT Visit Diagnosis: Cognitive communication deficit (R41.841)   Activity Tolerance Patient  tolerated treatment well   Patient Left in bed;with call bell/phone within reach;with bed alarm set;with family/visitor present   Nurse Communication Mobility status        Time: 7616-0737 OT Time Calculation (min): 20 min  Charges: OT General Charges $OT Visit: 1 Visit OT Treatments $Therapeutic Activity: 8-22 mins  Lanier Clam., COTA/L Acute Rehabilitation Services [phone]    Ihor Gully 01/29/2020, 4:25 PM

## 2020-01-29 NOTE — Plan of Care (Signed)

## 2020-01-29 NOTE — Progress Notes (Signed)
Subjective:   Overnight, no acute events.  This morning, patient reports having sore throat following EGD. She reports pain in her bilateral lower extremities. She is very appreciative of the care that she has received thus far.   Objective:  Vital signs in last 24 hours: Vitals:   01/29/20 1132 01/29/20 1143 01/29/20 1145 01/29/20 1155  BP: 131/71 (!) 145/92 (!) 145/92 (!) 164/86  Pulse: 98 97 91 88  Resp: (!) 26 (!) 23 20 (!) 22  Temp: 97.8 F (36.6 C)     TempSrc: Oral     SpO2: 100% 100% 100% 100%  Weight:      Height:      SpO2: 100 %  Intake/Output Summary (Last 24 hours) at 01/29/2020 1353 Last data filed at 01/29/2020 1133 Gross per 24 hour  Intake 200 ml  Output --  Net 200 ml   Filed Weights   01/26/20 2214 01/27/20 0500 01/28/20 0500  Weight: 82.6 kg 85.3 kg 85 kg   Physical Exam Vitals and nursing note reviewed. Exam conducted with a chaperone present.  Constitutional:      General: She is not in acute distress. HENT:     Mouth/Throat:     Mouth: Mucous membranes are moist.     Pharynx: Oropharynx is clear. No posterior oropharyngeal erythema.  Cardiovascular:     Rate and Rhythm: Normal rate and regular rhythm.     Pulses: Normal pulses.     Heart sounds: Normal heart sounds.  Pulmonary:     Effort: Pulmonary effort is normal.     Breath sounds: Normal breath sounds.  Abdominal:     General: Abdomen is flat. Bowel sounds are normal.     Palpations: Abdomen is soft.     Tenderness: There is abdominal tenderness.     Comments: Mild epigastric tenderness to palpation  Musculoskeletal:        General: Normal range of motion.     Cervical back: Normal range of motion and neck supple. No tenderness.  Neurological:     General: No focal deficit present.     Mental Status: She is alert. Mental status is at baseline.  Psychiatric:        Mood and Affect: Mood normal.        Behavior: Behavior normal.        Thought Content: Thought content normal.         Judgment: Judgment normal.    CBC Latest Ref Rng & Units 01/29/2020 01/28/2020 01/27/2020  WBC 4.0 - 10.5 K/uL 8.0 8.0 9.8  Hemoglobin 12.0 - 15.0 g/dL 9.1(L) 8.8(L) 9.6(L)  Hematocrit 36 - 46 % 26.4(L) 25.7(L) 28.0(L)  Platelets 150 - 400 K/uL 283 246 258   BMP Latest Ref Rng & Units 01/29/2020 01/28/2020 01/27/2020  Glucose 70 - 99 mg/dL 101(H) 102(H) 109(H)  BUN 8 - 23 mg/dL 7(L) 11 16  Creatinine 0.44 - 1.00 mg/dL 0.76 0.74 1.34(H)  BUN/Creat Ratio 12 - 28 - - -  Sodium 135 - 145 mmol/L 138 136 135  Potassium 3.5 - 5.1 mmol/L 4.2 3.2(L) 2.8(L)  Chloride 98 - 111 mmol/L 108 107 103  CO2 22 - 32 mmol/L 18(L) 18(L) 19(L)  Calcium 8.9 - 10.3 mg/dL 9.4 9.0 8.8(L)   LDH - 162  IMAGING: EEG IMPRESSION: This study is suggestive of mild to moderate diffuse encephalopathy, nonspecific etiology. No seizures or epileptiform discharges were seen throughout the recording.  Assessment/Plan:  Principal Problem:   AKI (acute kidney  injury) [facility]) Active Problems:   Esophageal reflux   Fibrocystic breast disease   Generalized anxiety disorder   Esophageal dysphagia   Unintentional weight loss of more than 10 pounds   Temporal lobe epilepsy ([place])   Aortic atherosclerosis ([place])   Malnutrition of moderate degree  Ms. Eshaal Duby is a 74 year old woman with a remote history of cervical cancer as well as smoking, HTN, CKD 3a, temporal lobe seizure, OA, fibromyalgia, and sciatica who presented to [facility] on 01/26/2020 with progressive weakness and poor appetite.  #Dysphagia, active Patient underwent EGD without complication, however she does have some sore throat. Patient was noted to have no gross lesions proximally, however distally there was benign-appearing esophageal stenosis. Dilation was performed of the esophagus. Associated findings include 2cm hiatal hernia and gastritis. Multiple biopsies were sent to pathology including (proximal and distal esophagus, stomach, and first and  second portions of duodenum. Findings on EGD likely the cause of patient's symptoms. -GI consulted, appreciate recommendations  -Clear liquid diet today  -Continue pantoprazole 40mg  twice daily  -Cepacol or Halls Lozenges +/- Chloraseptic spray for 72-96h for sore throat  -Await pathology results  -Hold VTE ppx for at least 24 hours  -Will take at least a week to see full improvement post dilation  -If dysphagia occurs in future and pathology is unremarkable, then consider repeat EGD if patient has improvement that is longer lasting but if no significant improvement then consider manometry testing -RD consulted, appreciate recommendations  -Ensure Enlive po TID; MVI; Magic cup TID with meals -LR 100cc/hr -Daily CBC  #Advanced dementia ([place] of 13/30), chronic #History of temporal lobe epilepsy, chronic Patient reportedly not experiencing episodes of confusion or hallucinations over the last two days. CT head and MRI of brain unremarkable for an acute or chronic process. HIV, RPR and Vitamin B12 within normal limits/negative. TSH prior to admission within normal limits. EEG revealing mild to moderate diffuse encephalopathy. Patient has poor baseline cognitive function and likely a component of depression contributing to her presentation. -Continue mirtazapine daily at bedtime -Continue seroquel -Continue Keppra -Consider neuropsychological testing on an outpatient basis -Continue follow-up with neurology in outpatient setting  #AKI on CKD3a, resolved Creatinine stable at 0.7 -Restart losartan -Continue holding HCTZ -Continue LR 100cc/hr -Daily BMP  #HTN, chronic Patient mildly hypertensive following EGD. Will restart home losartan but continue to hold HCTZ. -Restart losartan -Continue holding HCTZ  #Hypokalemia, active Potassium of 4.2 on this morning's labs. -Potassium chloride 23mEq twice daily -Daily BMP  #Sciatica, chronic #Fibromyalgia, chronic #Osteoarthritis,  chronic -Continue home Norco 0.5-1 tablet Q12H -Acetaminophen Q6H PRN  #Code status: Full code #Diet: Clear liquid #VTE ppx: SCDs for 24 hours (restart anticoagulation tomorrow at noon) #IVF: LR 100cc/hr #PT/OT recs: SNF, supervision/assistance 24 hours. Equipment: wheelchair, 3in1  Cato Mulligan, MD 01/29/2020, 1:53 PM Pager: [phone] After 5pm on weekdays and 1pm on weekends: On Call pager [phone]

## 2020-01-29 NOTE — Transfer of Care (Signed)
Immediate Anesthesia Transfer of Care Note  Patient: Heather Horton  Procedure(s) Performed: ESOPHAGOGASTRODUODENOSCOPY (EGD) WITH PROPOFOL (N/A ) SAVARY DILATION (N/A ) BIOPSY  Patient Location: Endoscopy Unit  Anesthesia Type:MAC  Level of Consciousness: drowsy  Airway & Oxygen Therapy: Patient Spontanous Breathing and Patient connected to nasal cannula oxygen  Post-op Assessment: Report given to RN and Post -op Vital signs reviewed and stable  Post vital signs: Reviewed and stable  Last Vitals:  Vitals Value Taken Time  BP    Temp    Pulse    Resp    SpO2      Last Pain:  Vitals:   01/29/20 1001  TempSrc: Oral  PainSc: 10-Worst pain ever      Patients Stated Pain Goal: 3 (46/43/14 2767)  Complications: No complications documented.

## 2020-01-29 NOTE — Anesthesia Postprocedure Evaluation (Signed)
Anesthesia Post Note  Patient: Mylei Brackeen  Procedure(s) Performed: ESOPHAGOGASTRODUODENOSCOPY (EGD) WITH PROPOFOL (N/A ) SAVARY DILATION (N/A ) BIOPSY     Patient location during evaluation: PACU Anesthesia Type: General Level of consciousness: awake and alert Pain management: pain level controlled Vital Signs Assessment: post-procedure vital signs reviewed and stable Respiratory status: spontaneous breathing, nonlabored ventilation, respiratory function stable and patient connected to nasal cannula oxygen Cardiovascular status: stable and blood pressure returned to baseline Postop Assessment: no apparent nausea or vomiting Anesthetic complications: no   No complications documented.  Last Vitals:  Vitals:   01/29/20 1145 01/29/20 1155  BP: (!) 145/92 (!) 164/86  Pulse: 91 88  Resp: 20 (!) 22  Temp:    SpO2: 100% 100%    Last Pain:  Vitals:   01/29/20 1155  TempSrc:   PainSc: 10-Worst pain ever                 Shaunda Tipping COKER

## 2020-01-29 NOTE — Progress Notes (Signed)
Physical Therapy Treatment Patient Details Name: Heather Horton MRN: [id number] DOB: [birth year] Today's Date: 01/29/2020    History of Present Illness Pt is a 74 y/o F presenting to ED on 11/27 for weakness that caused her legs to give out on her and decreased responsiveness. Pt has had increased falls over the past 2 months with loss of appetite/weight loss. Pt found to now have AKI, mild diffuse encepalopathy, and dementia with visual hallucinations. PMH includes hepatitis C, GERD, HTN, hyperlipidemia, prior cervical cancer, chronic cervical and LBP, and seizures (on Keppra).    PT Comments    Pt shows a decreased need for assistance with bed mobility and transfers this session. Pt however has difficulty with sequencing steps needed for bed mobility and transfers. Pt also shows decreased static and dynamic standing balance and LE strength and endurance. Decreased safety awareness with RW use and transfers was also demonstrated by the pts continued attempts to transfer with both hands on the walker. Pts frequency has been decreased to 2x/week based on d/c recommendation to SNF.   Follow Up Recommendations  SNF;Supervision/Assistance - 24 hour     Equipment Recommendations  3in1 (PT);Wheelchair (measurements PT);Wheelchair cushion (measurements PT)    Recommendations for Other Services       Precautions / Restrictions Precautions Precautions: Fall    Mobility  Bed Mobility Overal bed mobility: Needs Assistance Bed Mobility: Supine to Sit;Sit to Supine     Supine to sit: HOB elevated;Min assist Sit to supine: Mod assist   General bed mobility comments: pt min assist for supine to sit with increased time for processing of sequencing and 1 person hand held assist for bringing trunk to sitting; pt mod assist +1 for sit to supine for verbal and tactile cueing of sequencing of trunk sequencing and physical assistance of legs  Transfers Overall transfer level: Needs  assistance Equipment used: Rolling walker (2 wheeled) Transfers: Sit to/from Stand Sit to Stand: Min assist;Min guard         General transfer comment: 3x sit to stand with side stepping to the L towards HOB, pt needed cueing every attempt for safe hand placement on bed instead of walker; pt needed cueing to bring walker with her while side stepping; legs were bouncing while standing, but less than last time; pt was min assist +1 for 2 attempts and then min guard for the last attempt of sit to stand  Ambulation/Gait                 Stairs             Wheelchair Mobility    Modified Rankin (Stroke Patients Only)       Balance Overall balance assessment: Needs assistance Sitting-balance support: Feet supported Sitting balance-Leahy Scale: Good Sitting balance - Comments: pt had posterior lean while performing hip flexion and LAQ in sitting Postural control: Posterior lean Standing balance support: Bilateral upper extremity supported Standing balance-Leahy Scale: Poor Standing balance comment: pt with reliance on bil UE support on RW                            Cognition Arousal/Alertness: Awake/alert Behavior During Therapy: [facility] for tasks assessed/performed Overall Cognitive Status: Impaired/Different from baseline Area of Impairment: Attention;Orientation;Memory;Following commands;Awareness;Safety/judgement;Problem solving                 Orientation Level: Disoriented to;Time Current Attention Level: Sustained Memory: Decreased short-term memory Following Commands: Follows one step commands  inconsistently;Follows one step commands with increased time Safety/Judgement: Decreased awareness of safety;Decreased awareness of deficits   Problem Solving: Slow processing;Difficulty sequencing;Decreased initiation;Requires verbal cues;Requires tactile cues        Exercises General Exercises - Lower Extremity Long Arc Quad: AROM;Both;Seated;10  reps Hip Flexion/Marching: AROM;Both;Seated;10 reps    General Comments        Pertinent Vitals/Pain Pain Assessment: 0-10 Pain Score: 10-Worst pain ever Pain Location: bil legs Pain Descriptors / Indicators: Aching;Constant Pain Intervention(s): Limited activity within patient's tolerance;Monitored during session;Repositioned    Home Living                      Prior Function            PT Goals (current goals can now be found in the care plan section) Acute Rehab PT Goals Patient Stated Goal: pt unable to state  Progress towards PT goals: Progressing toward goals    Frequency    Min 2X/week      PT Plan Frequency needs to be updated    Co-evaluation              AM-PAC PT "6 Clicks" Mobility   Outcome Measure  Help needed turning from your back to your side while in a flat bed without using bedrails?: A Little Help needed moving from lying on your back to sitting on the side of a flat bed without using bedrails?: A Little Help needed moving to and from a bed to a chair (including a wheelchair)?: A Lot Help needed standing up from a chair using your arms (e.g., wheelchair or bedside chair)?: A Little Help needed to walk in hospital room?: A Lot Help needed climbing 3-5 steps with a railing? : Total 6 Click Score: 14    End of Session Equipment Utilized During Treatment: Gait belt Activity Tolerance: Patient tolerated treatment well Patient left: in bed;with call bell/phone within reach;with bed alarm set   PT Visit Diagnosis: Other abnormalities of gait and mobility (R26.89);Difficulty in walking, not elsewhere classified (R26.2);Muscle weakness (generalized) (M62.81);Unsteadiness on feet (R26.81);Pain Pain - part of body: Leg (bilat legs)     Time: 6834-1962 PT Time Calculation (min) (ACUTE ONLY): 20 min  Charges:  $Therapeutic Activity: 8-22 mins                     Caleb Popp, SPT [id number]  Heather Horton 01/29/2020, 8:13 AM

## 2020-01-29 NOTE — Progress Notes (Signed)
Pt NPO after midnight for a procedure.

## 2020-01-29 NOTE — Op Note (Signed)
[facility] Patient Name: Heather Horton Procedure Date : 01/29/2020 MRN: [id number] Attending MD: Justice Britain , MD Date of Birth: 1946-01-21 CSN: [id number] Age: 74 Admit Type: Inpatient Procedure:                Upper GI endoscopy Indications:              Dysphagia, Anorexia, Regurgitation, Weight loss Providers:                Justice Britain, MD, Josie Dixon, RN, Cletis Athens, Technician, Lesia Sago, Technician,                            Pearline Cables, CRNA Referring MD:             Hospital Medicine Team Medicines:                Monitored Anesthesia Care Complications:            No immediate complications. Estimated Blood Loss:     Estimated blood loss was minimal. Procedure:                Pre-Anesthesia Assessment:                           - Prior to the procedure, a History and Physical                            was performed, and patient medications and                            allergies were reviewed. The patient's tolerance of                            previous anesthesia was also reviewed. The risks                            and benefits of the procedure and the sedation                            options and risks were discussed with the patient.                            All questions were answered, and informed consent                            was obtained. Prior Anticoagulants: The patient has                            taken heparin, last dose was 1 day prior to                            procedure. ASA Grade Assessment: III - A patient  with severe systemic disease. After reviewing the                            risks and benefits, the patient was deemed in                            satisfactory condition to undergo the procedure.                           After obtaining informed consent, the endoscope was                            passed under direct vision.  Throughout the                            procedure, the patient's blood pressure, pulse, and                            oxygen saturations were monitored continuously. The                            GIF-H190 ([id number] Olympus gastroscope was                            introduced through the mouth, and advanced to the                            second part of duodenum. The upper GI endoscopy was                            accomplished without difficulty. The patient                            tolerated the procedure. Scope In: Scope Out: Findings:      No gross lesions were noted in the proximal esophagus and in the mid       esophagus. Biopsies were taken with a cold forceps for histology to rule       out EoE/LoE.      One benign-appearing, intrinsic mild (non-circumferential scarring)       stenosis was found 39 cm from the incisors. This stenosis measured less       than one cm (in length). The stenosis was traversed. This was disrupted       initially with cold forceps.      The Z-line was regular and was found 40 cm from the incisors.      After completion of the rest of the EGD, a guidewire was placed and the       scope was withdrawn. Dilation was performed in the entire esophagus with       a Savary dilator with no resistance at 16 mm and mild resistance at 18       mm. The area in the proximal esophagus just below UES had evidence after       dilation and endoscope reinsertion of a moderate mucosal disruption       (wrent), moderate improvement in luminal narrowing and  no perforation.       The distal stenosis also had a mucosal wrent present and no mucosal       perforation.      A 2 cm hiatal hernia was present.      Patchy mild inflammation characterized by erosions and erythema was       found in the entire examined stomach.      No other gross lesions were noted in the entire examined stomach.       Biopsies were taken with a cold forceps for histology and Helicobacter        pylori testing.      No gross lesions were noted in the duodenal bulb, in the first portion       of the duodenum and in the second portion of the duodenum. Biopsies were       taken with a cold forceps for histology. Impression:               - No gross lesions in esophagus proximally.                            Biopsied.                           - Benign-appearing esophageal stenosis distally.                            Biopsied.                           - Dilation performed in the entire esophagus and                            findings of 2 mucosal wrents at the proximal and                            distal esophagus.                           - Z-line regular, 40 cm from the incisors.                           - 2 cm hiatal hernia.                           - Gastritis. No other gross lesions in the stomach.                            Biopsied.                           - No gross lesions in the duodenal bulb, in the                            first portion of the duodenum and in the second                            portion of the duodenum. Biopsied. Recommendation:           -  The patient will be observed post-procedure,                            until all discharge criteria are met.                           - Return patient to hospital ward for ongoing care.                           - Clear liquid diet today.                           - Continue PPI 40 mg twice daily.                           - Please use Cepacol or Halls Lozenges +/-                            Chloraseptic spray for next 72-96 hours to aid in                            sore thoat should you experience this.                           - Monitor for signs/symptoms of bleeding,                            perforation, and infection. If issues please call                            our number to get further assistance as needed.                           - Observe patient's clinical course.                            - Will take at least a week to see true improvement                            post dilation.                           - Await pathology results.                           - Hold restart of VTE PPx for at least 24 hours if                            possible to decrease risk of bleeding                            post-intervention.                           - If issues of dysphagia  occur again in future and                            pathology is unremarkable then consider repeat EGD                            if patient has improvement that is longer lasting                            but if no significant improvement then consider                            Manometry testing in future.                           - The findings and recommendations were discussed                            with the patient.                           - The findings and recommendations were discussed                            with the patient's family.                           - The findings and recommendations were discussed                            with the referring physician. Procedure Code(s):        --- Professional ---                           [phone], Esophagogastroduodenoscopy, flexible,                            transoral; with insertion of guide wire followed by                            passage of dilator(s) through esophagus over guide                            wire Diagnosis Code(s):        --- Professional ---                           K22.2, Esophageal obstruction                           K44.9, Diaphragmatic hernia without obstruction or                            gangrene                           K29.70, Gastritis, unspecified, without bleeding  R13.10, Dysphagia, unspecified                           R63.0, Anorexia                           R11.10, Vomiting, unspecified                           R63.4, Abnormal weight loss CPT copyright  2019 American Medical Association. All rights reserved. The codes documented in this report are preliminary and upon coder review may  be revised to meet current compliance requirements. Justice Britain, MD 01/29/2020 1:56:29 PM Number of Addenda: 0

## 2020-01-29 NOTE — Discharge Summary (Signed)
Name: Heather Horton MRN: [id number] DOB: 01-30-1946 74 y.o. PCP: [place].  Date of Admission: 01/26/2020  4:12 PM Date of Discharge:  02/04/2020 Attending Physician: Lucious Groves, DO  Discharge Diagnosis: 1. H pylori Gastritis 2. Dysphagia 3. Acute Kidney Injury 4. Malnutrition  Discharge Medications: Allergies as of 02/04/2020       Reactions   Molds & Smuts Other (See Comments)   Per allergy test   Pollen Extract Other (See Comments)   Per allergy test   Aspirin Nausea And Vomiting, Rash        Medication List     STOP taking these medications    hydrochlorothiazide 25 MG tablet Commonly known as: HYDRODIURIL   losartan 100 MG tablet Commonly known as: COZAAR       TAKE these medications    acetaminophen 325 MG tablet Commonly known as: TYLENOL Take 2 tablets (650 mg total) by mouth every 6 (six) hours as needed for mild pain (or Fever >/= 101).   albuterol 108 (90 Base) MCG/ACT inhaler Commonly known as: VENTOLIN HFA Inhale 2 puffs into the lungs every 6 (six) hours as needed for wheezing.   atorvastatin 10 MG tablet Commonly known as: LIPITOR Take 1 tablet (10 mg total) by mouth daily.   bismuth subsalicylate 827 MG chewable tablet Commonly known as: PEPTO BISMOL Chew 2 tablets (524 mg total) by mouth 4 (four) times daily for 5 days.   diazepam 5 MG tablet Commonly known as: Valium Take 1 tablet 30 minutes prior to MRI. May take second dose if needed. What changed:   how much to take  how to take this  when to take this  additional instructions   diclofenac Sodium 1 % Gel Commonly known as: VOLTAREN Apply 2 g topically 4 (four) times daily.   ergocalciferol 1.25 MG (50000 UT) capsule Commonly known as: VITAMIN D2 Take 50,000 Units by mouth every Wednesday.   folic acid 078 MCG tablet Commonly known as: FOLVITE Take 800 mcg by mouth in the morning.   HYDROcodone-acetaminophen 10-325 MG tablet Commonly known  as: NORCO Take 0.5-1 tablets by mouth in the morning and at bedtime.   hydrOXYzine 10 MG tablet Commonly known as: ATARAX/VISTARIL Take 10 mg by mouth daily as needed for anxiety.   ketoconazole 2 % cream Commonly known as: NIZORAL Apply 1 application topically as needed for irritation.   levETIRAcetam 500 MG tablet Commonly known as: KEPPRA TAKE 1 TABLET BY MOUTH TWICE A DAY   loratadine 10 MG tablet Commonly known as: CLARITIN Take 10 mg by mouth daily as needed for allergies or rhinitis.   metroNIDAZOLE 250 MG tablet Commonly known as: FLAGYL Take 1 tablet (250 mg total) by mouth 4 (four) times daily for 5 days.   mirtazapine 7.5 MG tablet Commonly known as: REMERON Take 7.5 mg by mouth at bedtime.   pantoprazole 40 MG tablet Commonly known as: PROTONIX Take 1 tablet (40 mg total) by mouth 2 (two) times daily before a meal for 5 days. What changed: See the new instructions.   QUEtiapine 300 MG tablet Commonly known as: SEROQUEL Take 1 tablet (300 mg total) by mouth at bedtime.   sodium bicarbonate 650 MG tablet Take 1 tablet (650 mg total) by mouth 2 (two) times daily.   tetracycline 500 MG capsule Commonly known as: SUMYCIN Take 1 capsule (500 mg total) by mouth 4 (four) times daily for 5 days.   Visine-AC 0.05-0.25 % ophthalmic solution Generic drug:  tetrahydrozoline-zinc Place 1-2 drops into both eyes 3 (three) times daily as needed (for irritation).               Durable Medical Equipment  (From admission, onward)           Start     Ordered   02/04/20 1114  For home use only DME standard manual wheelchair with seat cushion  Once       Comments: Patient suffers from dysphagia, h pylori gastritis and general deconditioning which impairs their ability to perform daily activities like bathing, dressing, feeding, grooming, and toileting in the home.  A cane, crutch, or walker will not resolve issue with performing activities of daily living. A  wheelchair will allow patient to safely perform daily activities. Patient can safely propel the wheelchair in the home or has a caregiver who can provide assistance. Length of need 6 months . Accessories: elevating leg rests (ELRs), wheel locks, extensions and anti-tippers.   02/04/20 1125            Disposition and follow-up:   [name] was discharged from [facility] in Stable condition.  At the hospital follow up visit please address:  1.  GI symptoms: Patient had some dysphagia and loss appetite. Dysphagia had resolved and appetite was improving. Please ensure she has regained her full appetite and maintain the appropriate calorie intake.  2. Kidney function: Please check her renal function to monitor her creatinine and potassium.  3.  Labs / imaging needed at time of follow-up: BMP, CBC  4.  Pending labs/ test needing follow-up: None  Follow-up Appointments:  PCP appointment on 02/15/2020   Hospital Course by problem list: 1. Dysphagia. Patient presented to the ED with complaints of poor appetite, weight loss and the sensation of food getting stuck in her chest. She was evaluated by SLP who reported patient to have normal oropharyngeal function and recommended GI consultation. GI was consulted who recommended patient to undergo EGD. She underwent EGD on 01/29/2020 which was most notable for distal esophageal stenosis. She was started on a liquid diet following the procedure and continued on PPI twice daily. A biopsy was taken during the EGD and the results showed evidence of chronic gastritis with H. Pylori but no metaplasia, dysplasia, malignancy, celiac disease, eosinophilic or lymphocytic esophagitis. Patient was started Bismuth quadruple therapy. She completed 5 days of therapy during admission and was discharged home to finish the treatment for 5 more days. Her dysphagia had resolved and her appetite was much improved at discharge. She will follow up with  GI 6 weeks after treatment to check for complete eradication of the infection.   2. Advanced dementia/History of temporal lobe epilepsy. Patient's daughter reported worsening dementia in patient over the last several weeks/months. Patient underwent CT Head, MRI Brain, and EEG all of which were unremarkable for an acute change to explain patient's worsened cognition, however EEG showed moderate diffuse encephalopathy. She was continued on her home medication regimen with stabilization of her mentation throughout her hospitalization.   3. AKI. Patient had significant AKI on arrival with creatinine of 2.93. Creatinine stabilized around 0.7 following IVF resuscitation. Cr increased slightly to 1.03 on day of discharge. She would need a BMP check at her PCP.   4. HTN. On initial arrival patient normotensive with AKI. Her home losartan and HCTZ were held. Patient began to have mild hypertension throughout hospitalization and losartan was restarted. Her BP was normal at discharge but since it was  soft during most of her hospitalization, she was instructed to home her HCTZ and losartan until she follows up with her PCP.   Discharge Vitals:   BP (!) 148/83 (BP Location: Left Arm)   Pulse 91   Temp 98.6 F (37 C) (Oral)   Resp 20   Ht 5\' 5"  (1.651 m)   Wt 85 kg   SpO2 100%   BMI 31.18 kg/m   Pertinent Labs, Studies, and Procedures:  BMP Latest Ref Rng & Units 02/02/2020 02/01/2020 01/31/2020  Glucose 70 - 99 mg/dL 112(H) 108(H) 111(H)  BUN 8 - 23 mg/dL 13 10 7(L)  Creatinine 0.44 - 1.00 mg/dL 1.03(H) 0.91 0.83  BUN/Creat Ratio 12 - 28 - - -  Sodium 135 - 145 mmol/L 137 137 138  Potassium 3.5 - 5.1 mmol/L 3.7 4.0 4.3  Chloride 98 - 111 mmol/L 109 108 109  CO2 22 - 32 mmol/L 15(L) 15(L) 18(L)  Calcium 8.9 - 10.3 mg/dL 9.0 9.6 9.8   CBC Latest Ref Rng & Units 02/02/2020 02/01/2020 01/31/2020  WBC 4.0 - 10.5 K/uL 8.9 9.0 9.8  Hemoglobin 12.0 - 15.0 g/dL 8.9(L) 9.7(L) 9.1(L)  Hematocrit 36 - 46 %  26.9(L) 28.1(L) 26.7(L)  Platelets 150 - 400 K/uL 290 308 289    Discharge Instructions:   Ms. Michna,  It was a pleasure taking care of you at [place] were diagnosed with H pylori gastritis and this was the main reason for your loss of appetite and stomach symptoms. We are glad your appetite is improving. Please continue to take the rest of the medications to treat the infection. We are also sending you home with home health PT/OT/Aide to help you with your strength and mobility. Make sure to follow the intructions below: 1) Continue your Tetracycline, Flagyl, Bismuth and Protonix for 5 more days 2) Start taking Sodium bicarb 650 mg twice a day 3) Hold your losartan and HCTZ until you see your primary care doctor 3) Schedule a hospital follow up with your primary care doctor next week.  4) You have an appointment with GI early in January 28th, 2022, make sure to go to that appointment.  Take care,  Dr. Linwood Dibbles, MD, MPH  Signed: Lacinda Axon, MD 02/04/2020, 12:46 PM Pager: [phone] Internal Medicine Teaching Service

## 2020-01-29 NOTE — Interval H&P Note (Signed)
History and Physical Interval Note:  01/29/2020 10:35 AM  Heather Horton  has presented today for surgery, with the diagnosis of Dysphagia.  The various methods of treatment have been discussed with the patient and family. After consideration of risks, benefits and other options for treatment, the patient has consented to  Procedure(s): ESOPHAGOGASTRODUODENOSCOPY (EGD) WITH PROPOFOL (N/A) BALLOON DILATION (N/A) as a surgical intervention.  The patient's history has been reviewed, patient examined, no change in status, stable for surgery.  I have reviewed the patient's chart and labs.  Questions were answered to the patient's satisfaction.     [employer]

## 2020-01-30 ENCOUNTER — Other Ambulatory Visit: Payer: Self-pay | Admitting: Physician Assistant

## 2020-01-30 LAB — CBC
HCT: 25.4 % — ABNORMAL LOW (ref 36.0–46.0)
Hemoglobin: 9 g/dL — ABNORMAL LOW (ref 12.0–15.0)
MCH: 33 pg (ref 26.0–34.0)
MCHC: 35.4 g/dL (ref 30.0–36.0)
MCV: 93 fL (ref 80.0–100.0)
Platelets: 288 10*3/uL (ref 150–400)
RBC: 2.73 MIL/uL — ABNORMAL LOW (ref 3.87–5.11)
RDW: 15.1 % (ref 11.5–15.5)
WBC: 9.5 10*3/uL (ref 4.0–10.5)
nRBC: 0.4 % — ABNORMAL HIGH (ref 0.0–0.2)

## 2020-01-30 LAB — BASIC METABOLIC PANEL
Anion gap: 10 (ref 5–15)
BUN: 8 mg/dL (ref 8–23)
CO2: 16 mmol/L — ABNORMAL LOW (ref 22–32)
Calcium: 9.5 mg/dL (ref 8.9–10.3)
Chloride: 112 mmol/L — ABNORMAL HIGH (ref 98–111)
Creatinine, Ser: 0.74 mg/dL (ref 0.44–1.00)
GFR, Estimated: >60 mL/min (ref >60)
Glucose, Bld: 93 mg/dL (ref 70–99)
Potassium: 5.3 mmol/L — ABNORMAL HIGH (ref 3.5–5.1)
Sodium: 138 mmol/L (ref 135–145)

## 2020-01-30 LAB — SURGICAL PATHOLOGY

## 2020-01-30 MED ORDER — HYDROCODONE-ACETAMINOPHEN 10-325 MG PO TABS
0.5000 | ORAL_TABLET | Freq: Three times a day (TID) | ORAL | Status: DC
  Administered 2020-01-30 – 2020-02-01 (×6): 1 via ORAL
  Filled 2020-01-30 (×6): qty 1

## 2020-01-30 MED ORDER — ENOXAPARIN SODIUM 40 MG/0.4ML ~~LOC~~ SOLN
40.0000 mg | Freq: Every day | SUBCUTANEOUS | Status: DC
  Administered 2020-01-30 – 2020-02-04 (×6): 40 mg via SUBCUTANEOUS
  Filled 2020-01-30 (×6): qty 0.4

## 2020-01-30 MED ORDER — PHENOL 1.4 % MT LIQD: 1.0000 | OROMUCOSAL | Status: DC | PRN

## 2020-01-30 NOTE — TOC Progression Note (Signed)
Transition of Care [facility]) - Progression Note    Patient Details  Name: Heather Horton MRN: [id number] Date of Birth: [birth year]  [facility] of [facility]) CM/SW Contact  Joanne Chars, LCSW Phone Number: 01/30/2020, 1:50 PM  Clinical Narrative:   CSW met with pt and daughter, discussed bed offers, provided choice document.  Daughter chooses [facility].  CSW spoke with Caren Griffins at [facility] who will initiate auth.  Daughter requested CSW speak with family friend Susann Givens, [phone], pt approves.  Al[place]air had a few questions about how authorization worked, which were answered.     Expected Discharge Plan: [place] Barriers to Discharge: Continued Medical Work up, Ship broker, SNF Pending bed offer  Expected Discharge Plan and Services Expected Discharge Plan: [place] Choice: [place] arrangements for the past 2 months: Single Family Home                                       Social Determinants of Health (SDOH) Interventions    Readmission Risk Interventions No flowsheet data found.

## 2020-01-30 NOTE — Progress Notes (Signed)
Subjective:   Hospital day: 5  Overnight event: No acute events overnight.  Patient states cannot eat any food due to sore throat from EGD. This morning, she started cough up a large amoun of phlegm. She has been feeling cold and having chills but denies fevers. Continues to have some shortness of breath. Assured patient that her throat should improve with time and encouraged her to continue eating.  Continues to have significant leg pain limiting ability to participate with PT and would like to try something. Notes she has been having a lot of pain making it difficult for her to sleep.  Objective:  Vital signs in last 24 hours: Vitals:   01/29/20 1155 01/29/20 1440 01/29/20 2142 01/30/20 0517  BP: (!) 164/86 (!) 148/83 116/74 125/74  Pulse: 88 91 (!) 106 96  Resp: (!) 22 20 20 20   Temp:  98.6 F (37 C) 98.2 F (36.8 C) 98.6 F (37 C)  TempSrc:  Oral Oral Oral  SpO2: 100% 100% 99% 100%  Weight:      Height:        Physical Exam  General: Pleasant, ill-appearing elderly woman laying in bed. No acute distress. Head: Normocephalic. Atraumatic. Mouth: MMM. No erythema or exudate CV: RRR. No murmurs, rubs, or gallops. No LE edema Pulmonary: Lungs CTAB. Normal effort. No wheezing or rales.  Abdominal: Soft, nondistended. Mild ttp of the epigastric area. Normal bowel sounds. Extremities: Palpable pulses. Normal ROM. Skin: Warm and dry. No obvious rash or lesions. Neuro: A&Ox3. Moves all extremities. Normal sensation. No focal deficit. Psych: Normal mood and affect  Assessment/Plan: Heather Horton is a 74 y.o. female with history of cervical cancer, smoking, HTN, CKD 3a, temporal lobe seizure, OA, fibromyalgia, and sciatica who presented to [facility]26/2020 with progressive weakness and poor appetite.  Principal Problem:   AKI (acute kidney injury) ([place]) Active Problems:   Esophageal reflux   Fibrocystic breast disease   Generalized anxiety disorder   Esophageal  dysphagia   Unintentional weight loss of more than 10 pounds   Temporal lobe epilepsy (HCC)   Aortic atherosclerosis (HCC)   Malnutrition of moderate degree  #Dysphagia, active Patient underwent EGD without complication, however she does have some sore throat. Patient was noted to have no gross lesions proximally, however distally there was benign-appearing esophageal stenosis. Dilation was performed of the esophagus. Associated findings include 2cm hiatal hernia and gastritis. Multiple biopsies were sent to pathology including (proximal and distal esophagus, stomach, and first and second portions of duodenum. Findings on EGD likely the cause of patient's symptoms. Continues to endorse sore throat as well as epigastric pain today.  --GI consulted, appreciate recommendations --Advance diet to soft diet today --Continue pantoprazole 40mg  twice daily --Cepacol or Halls Lozenges +/- Chloraseptic spray for 72-96h for sore throat --Pending pathology results --Restarted VTE ppx today --RD consulted, appreciate recommendations --Ensure Enlive po TID; MVI; Magic cup TID with meals --LR 100cc/hr --Daily CBC  #Advanced dementia ([place] of 13/30), chronic #History of temporal lobe epilepsy, chronic Patient reportedly not experiencing episodes of confusion or hallucinations over the last two days. CT head and MRI of brain unremarkable for an acute or chronic process. HIV, RPR and Vitamin B12 within normal limits/negative. TSH prior to admission within normal limits. EEG revealing mild to moderate diffuse encephalopathy. Patient has poor baseline cognitive function and likely a component of depression contributing to her presentation. --Continue mirtazapine daily at bedtime --Continue seroquel --Continue Keppra --Consider neuropsychological testing on an outpatient basis --  Continue follow-up with neurology in outpatient setting  #AKI on CKD3a, resolved Creatinine stable at 0.7 --Continue  losartan --Continue holding HCTZ --Continue LR 100cc/hr --Daily BMP  #HTN, chronic Stable at 125/74 this morning.  Continue to monitor. --Continue losartan --Continue holding HCTZ  #Hypokalemia, active Potassium increased to 5.3 from 4.2 this morning. Discontinue KCl today. --Discontinue potassium chloride 29mEq twice daily --Daily BMP  #Sciatica, chronic #Fibromyalgia, chronic #Osteoarthritis, chronic --Increased Norco 0.5-1 tablet to TID from BID --Acetaminophen Q6H PRN  #Code status: Full code #Diet: Soft diet #VTE ppx: Lovenox 40 mg BID #IVF: LR 100cc/hr #PT/OT recs: SNF, supervision/assistance 24 hours. Equipment: wheelchair, 3in1  Prior to Admission Living Arrangement: Home Anticipated Discharge Location: SNF Barriers to Discharge: SNF placement, pending insurance autho Dispo: Anticipated discharge in approximately 1-2 day(s).   Signed: Lacinda Axon, MD 01/30/2020, 5:41 AM  Pager: [phone] Internal Medicine Teaching Service After 5pm on weekdays and 1pm on weekends: On Call pager: [phone]

## 2020-01-30 NOTE — Plan of Care (Signed)

## 2020-01-31 ENCOUNTER — Encounter: Payer: Self-pay | Admitting: Gastroenterology

## 2020-01-31 ENCOUNTER — Inpatient Hospital Stay: Payer: Medicare HMO

## 2020-01-31 DIAGNOSIS — B9681 Helicobacter pylori [H. pylori] as the cause of diseases classified elsewhere: Secondary | ICD-10-CM

## 2020-01-31 DIAGNOSIS — K2970 Gastritis, unspecified, without bleeding: Secondary | ICD-10-CM

## 2020-01-31 DIAGNOSIS — Z23 Encounter for immunization: Secondary | ICD-10-CM

## 2020-01-31 LAB — CBC
HCT: 26.7 % — ABNORMAL LOW (ref 36.0–46.0)
Hemoglobin: 9.1 g/dL — ABNORMAL LOW (ref 12.0–15.0)
MCH: 31.7 pg (ref 26.0–34.0)
MCHC: 34.1 g/dL (ref 30.0–36.0)
MCV: 93 fL (ref 80.0–100.0)
Platelets: 289 10*3/uL (ref 150–400)
RBC: 2.87 MIL/uL — ABNORMAL LOW (ref 3.87–5.11)
RDW: 15.2 % (ref 11.5–15.5)
WBC: 9.8 10*3/uL (ref 4.0–10.5)
nRBC: 0.4 % — ABNORMAL HIGH (ref 0.0–0.2)

## 2020-01-31 LAB — BASIC METABOLIC PANEL
Anion gap: 11 (ref 5–15)
BUN: 7 mg/dL — ABNORMAL LOW (ref 8–23)
CO2: 18 mmol/L — ABNORMAL LOW (ref 22–32)
Calcium: 9.8 mg/dL (ref 8.9–10.3)
Chloride: 109 mmol/L (ref 98–111)
Creatinine, Ser: 0.83 mg/dL (ref 0.44–1.00)
GFR, Estimated: >60 mL/min (ref >60)
Glucose, Bld: 111 mg/dL — ABNORMAL HIGH (ref 70–99)
Potassium: 4.3 mmol/L (ref 3.5–5.1)
Sodium: 138 mmol/L (ref 135–145)

## 2020-01-31 MED ORDER — BISMUTH SUBSALICYLATE 262 MG PO CHEW
524.0000 mg | CHEWABLE_TABLET | Freq: Four times a day (QID) | ORAL | Status: DC
  Administered 2020-01-31 – 2020-02-04 (×17): 524 mg via ORAL
  Filled 2020-01-31 (×20): qty 2

## 2020-01-31 MED ORDER — TETRACYCLINE HCL 250 MG PO CAPS
500.0000 mg | ORAL_CAPSULE | Freq: Four times a day (QID) | ORAL | Status: DC
  Administered 2020-01-31 – 2020-02-04 (×18): 500 mg via ORAL
  Filled 2020-01-31 (×19): qty 2

## 2020-01-31 MED ORDER — METRONIDAZOLE 500 MG PO TABS
250.0000 mg | ORAL_TABLET | Freq: Four times a day (QID) | ORAL | Status: DC
  Administered 2020-01-31 – 2020-02-04 (×18): 250 mg via ORAL
  Filled 2020-01-31 (×19): qty 1

## 2020-01-31 NOTE — Care Management Important Message (Signed)
Important Message  Patient Details  Name: Heather Horton MRN: [id number] Date of Birth: 25-Oct-1945   Medicare Important Message Given:  Yes  Called the patient room spoke with daughter Tomasa Rand to advise of IM delivery.  Son advised me to send to the patient home address.    Redonna Wilbert 01/31/2020, 1:29 PM

## 2020-01-31 NOTE — Progress Notes (Signed)
   Covid-19 Vaccination Clinic  Name:  Jamaiyah Pyle    MRN: [id number] DOB: 03-23-1945  01/31/2020  Ms. Missey was observed post Covid-19 immunization for 15 minutes without incident. She was provided with Vaccine Information Sheet and instruction to access the V-Safe system.   Ms. Spranger was instructed to call 911 with any severe reactions post vaccine: Marland Kitchen Difficulty breathing  . Swelling of face and throat  . A fast heartbeat  . A bad rash all over body  . Dizziness and weakness   Immunizations Administered    Name Date Dose VIS Date Route   Pfizer COVID-19 Vaccine 01/31/2020 12:11 PM 0.3 mL 12/19/2019 Intramuscular   Manufacturer: [place]   Lot: [id number]   [place]: [id number]

## 2020-01-31 NOTE — Progress Notes (Addendum)
Brief GI progress note  Pathology results as below FINAL MICROSCOPIC DIAGNOSIS:  A. DUODENUM, BIOPSY:  - Benign small bowel mucosa.  - No villous blunting or increase in intraepithelial lymphocytes.  - No dysplasia or malignancy.  B. STOMACH, BIOPSY:  - Chronic active gastritis with Helicobacter pylori.  - [place] is positive for Helicobacter pylori.  - No intestinal metaplasia, dysplasia, or malignancy.  C. ESOPHAGUS, BIOPSY:  - Benign squamous mucosa.  - No increase in intraepithelial eosinophils.  - No intestinal metaplasia, dysplasia, or malignancy.  D. ESOPHAGEAL RING, BIOPSY:  - Benign squamous mucosa.  - No increase in intraepithelial eosinophils.  - No intestinal metaplasia, dysplasia, or malignancy.   Patient has no evidence of celiac disease Stomach biopsies show evidence of H. pylori with recommendations for treatment as below Esophagus biopsies showed no eosinophilic or lymphocytic esophagitis. Esophageal ring showed no evidence of severe esophagitis.   Recommendations Quadruple therapy with Pylera x10 days  This is usually given 4 times per day --Individual Ingredients ----Bismuth 140 mg x3 = 420 mg ----Flagyl 125 mg x3 = 375 mg ----Tetracycline 125 mg x3 = 375 mg Plus BID PPI to be maintained  Or standard triple therapy amoxicillin/clarithromycin x14 days Plus twice daily PPI to be maintained  This notation is placed into the chart for follow-up by the medical service.  Outpatient follow-up will be made for 6 to 8 weeks.   Justice Britain, MD [facility] Office # [phone]

## 2020-01-31 NOTE — Progress Notes (Addendum)
Subjective:   Hospital day: 6  Overnight event: No acute events overnight.  Pathology results came back last night. CSW has placed insurance authorization for SNF placement.  This AM, patient states she still did not sleep well last night. She continues to have some pain in her legs and mild abdominal pain. She continues to have loss of appetite and states she tries to eat but has not been able to eat much. Patient was informed of the results of her biopsy pathology and the plan to treat it with medications.   Objective:  Vital signs in last 24 hours: Vitals:   01/29/20 1440 01/29/20 2142 01/30/20 0517 01/30/20 2111  BP: (!) 148/83 116/74 125/74 113/69  Pulse: 91 (!) 106 96 91  Resp: 20 20 20 18   Temp: 98.6 F (37 C) 98.2 F (36.8 C) 98.6 F (37 C) 97.9 F (36.6 C)  TempSrc: Oral Oral Oral Oral  SpO2: 100% 99% 100% 100%  Weight:      Height:        Physical Exam  General: Pleasant, ill-appearing elderly woman laying in bed. No acute distress. Head: Normocephalic. Atraumatic. Mouth: MMM. No erythema or exudate CV: RRR. No murmurs, rubs, or gallops. No LE edema Pulmonary: Lungs CTAB. Normal effort. No wheezing or rales.  Abdominal: Soft, nondistended. Mild ttp of the epigastric area. Normal bowel sounds. Extremities: Palpable pulses. Normal ROM. Skin: Warm and dry. No obvious rash or lesions. Neuro: A&Ox3. Moves all extremities. Normal sensation. No focal deficit. Psych: Normal mood and affect  Assessment/Plan: Heather Horton is a 74 y.o. female with history of cervical cancer, smoking, HTN, CKD 3a, temporal lobe seizure, OA, fibromyalgia, and sciatica who presented to [facility] on 01/26/2020 with progressive weakness and poor appetite.  Principal Problem:   AKI (acute kidney injury) ([place]) Active Problems:   Esophageal reflux   Fibrocystic breast disease   Generalized anxiety disorder   Esophageal dysphagia   Unintentional weight loss of more than 10 pounds   Temporal  lobe epilepsy (HCC)   Aortic atherosclerosis (HCC)   Malnutrition of moderate degree  #Dysphagia, active #Chronic Gastritis 2/2 H Pylori infection Patient underwent EGD without complication, however she does continues to have some sore throat. Patient was noted to have no gross lesions proximally, however distally there was benign-appearing esophageal stenosis. Dilation was performed of the esophagus. Associated findings include 2cm hiatal hernia and gastritis. Multiple biopsies were sent to pathology. Biopsy results today shows evidence of H. Pylori but no metaplasia, dysplasia, malignancy, celiac disease, eosinophilic or lymphocytic esophagitis. Patient continues to have decreased oral intake. Will treat H. Pylori and monitor for progress. GI recs below. --GI consulted, recommended quadruple therapy      --Bismuth 524 mg four times daily      --Flagyl 250 mg four times daily      --Tetracycline 500 mg four times daily      --Continue pantoprazole 40mg  twice daily --Outpatient GI follow-up scheduled for 03/28/2020 --Advance diet as tolerated --Chloraseptic spray for 72-96h for sore throat  --RD consulted, appreciate recommendations --Ensure Enlive po TID; MVI; Magic cup TID with meals --LR 100cc/hr --Daily CBC   #Advanced dementia (Moca of 13/30), chronic #History of temporal lobe epilepsy, chronic Patient reportedly not experiencing episodes of confusion or hallucinations over the last two days. CT head and MRI of brain unremarkable for an acute or chronic process. HIV, RPR and Vitamin B12 within normal limits/negative. TSH prior to admission within normal limits. EEG revealing mild to moderate  diffuse encephalopathy. Patient has poor baseline cognitive function and likely a component of depression contributing to her presentation. --Continue mirtazapine daily at bedtime --Continue seroquel 300 mg daily at bedtime --Continue Keppra 500 mg BID --Consider neuropsychological testing on an  outpatient basis --Continue follow-up with neurology in outpatient setting   #AKI on CKD3a, resolved Creatinine stable at 0.83 today --Continue losartan 100 mg daily --Continue holding HCTZ --Continue LR 100cc/hr --Daily BMP   #HTN, chronic Soft BP this morning. Currently at 102/56. Continue to monitor. --Continue losartan --Continue holding HCTZ   #Hypokalemia, active Potassium improved from 5.3 from 4.3 this morning. Will continue to monitor. --Daily BMP   #Sciatica, chronic #Fibromyalgia, chronic #Osteoarthritis, chronic --Norco 0.5-1 tablet to TID from BID --Acetaminophen Q6H PRN  #Code status: Full code #Diet: Soft diet #VTE ppx: Lovenox 40 mg BID #IVF: LR 100cc/hr #PT/OT recs: SNF, supervision/assistance 24 hours. Equipment: wheelchair, 3in1  Prior to Admission Living Arrangement: Home Anticipated Discharge Location: SNF Barriers to Discharge: SNF placement, pending insurance auth Dispo: Anticipated discharge in approximately 1-2 day(s).   Signed: Lacinda Axon, MD 01/31/2020, 5:42 AM  Pager: [phone] Internal Medicine Teaching Service After 5pm on weekdays and 1pm on weekends: On Call pager: [phone]

## 2020-02-01 LAB — CBC
HCT: 28.1 % — ABNORMAL LOW (ref 36.0–46.0)
Hemoglobin: 9.7 g/dL — ABNORMAL LOW (ref 12.0–15.0)
MCH: 32.4 pg (ref 26.0–34.0)
MCHC: 34.5 g/dL (ref 30.0–36.0)
MCV: 94 fL (ref 80.0–100.0)
Platelets: 308 10*3/uL (ref 150–400)
RBC: 2.99 MIL/uL — ABNORMAL LOW (ref 3.87–5.11)
RDW: 15.9 % — ABNORMAL HIGH (ref 11.5–15.5)
WBC: 9 10*3/uL (ref 4.0–10.5)
nRBC: 0 % (ref 0.0–0.2)

## 2020-02-01 LAB — BASIC METABOLIC PANEL
Anion gap: 14 (ref 5–15)
BUN: 10 mg/dL (ref 8–23)
CO2: 15 mmol/L — ABNORMAL LOW (ref 22–32)
Calcium: 9.6 mg/dL (ref 8.9–10.3)
Chloride: 108 mmol/L (ref 98–111)
Creatinine, Ser: 0.91 mg/dL (ref 0.44–1.00)
GFR, Estimated: >60 mL/min (ref >60)
Glucose, Bld: 108 mg/dL — ABNORMAL HIGH (ref 70–99)
Potassium: 4 mmol/L (ref 3.5–5.1)
Sodium: 137 mmol/L (ref 135–145)

## 2020-02-01 MED ORDER — HYDROCODONE-ACETAMINOPHEN 10-325 MG PO TABS
0.5000 | ORAL_TABLET | Freq: Four times a day (QID) | ORAL | Status: DC | PRN
  Administered 2020-02-01 – 2020-02-04 (×10): 1 via ORAL
  Filled 2020-02-01 (×10): qty 1

## 2020-02-01 MED ORDER — TRAMADOL HCL 50 MG PO TABS
100.0000 mg | ORAL_TABLET | Freq: Once | ORAL | Status: AC
  Administered 2020-02-01: 100 mg via ORAL
  Filled 2020-02-01: qty 2

## 2020-02-01 NOTE — Progress Notes (Signed)
Subjective:   Hospital day: 6  Overnight event: No acute events overnight. Pending SNF placement. This AM, patient states she still did not get a good night sleep. She reports a productive cough with blood-tinged phlegm. She continues to endorse loss of appetite and leg pains. Patient was reassured that her GI symptoms should improve with treatment of her H. Pylori. Denies SOB, chest pain or dizziness.   Objective:  Vital signs in last 24 hours: Vitals:   01/31/20 1346 01/31/20 1456 01/31/20 2130 02/01/20 0530  BP: (!) 102/56 (!) 115/59 119/67 121/67  Pulse: (!) 104 91 97 93  Resp: 19 20  20   Temp: 97.6 F (36.4 C) 98.2 F (36.8 C) 98.2 F (36.8 C) 98 F (36.7 C)  TempSrc:  Oral Oral Oral  SpO2: 100% 100%  100%  Weight:      Height:        Physical Exam  General: Pleasant, ill-appearing elderly woman laying in bed. No acute distress. Head: Normocephalic. Atraumatic. Mouth: MMM. No erythema or exudate CV: RRR. No murmurs, rubs, or gallops. No LE edema Pulmonary: Lungs CTAB. Normal effort. No wheezing or rales.  Abdominal: Soft, nondistended. Mild ttp of the epigastric area. Normal bowel sounds. Extremities: Palpable pulses. Normal ROM. Skin: Warm and dry. No obvious rash or lesions. Neuro: A&Ox3. Moves all extremities. Normal sensation. No focal deficit. Psych: Normal mood and affect  Assessment/Plan: Heather Horton is a 74 y.o. female with history of cervical cancer, smoking, HTN, CKD 3a, temporal lobe seizure, OA, fibromyalgia, and sciatica who presented to [facility] on 01/26/2020 with progressive weakness and poor appetite and found to have dysphagia and H. Pylori gastritis. Currently on Quadruple therapy w/ bismuth and pending SNF placement.   Principal Problem:   AKI (acute kidney injury) ([place]) Active Problems:   Esophageal reflux   Fibrocystic breast disease   Generalized anxiety disorder   Esophageal dysphagia   Unintentional weight loss of more than 10 pounds    Temporal lobe epilepsy (HCC)   Aortic atherosclerosis (HCC)   Malnutrition of moderate degree   Helicobacter pylori gastritis  #Dysphagia, active #Chronic Gastritis 2/2 H Pylori infection Patient underwent EGD without complication, however she does continues to have some sore throat. Patient was noted to have no gross lesions proximally, however distally there was benign-appearing esophageal stenosis. Dilation was performed of the esophagus. Associated findings include 2cm hiatal hernia and gastritis. Multiple biopsies were sent to pathology. Biopsy results showed evidence of chronic gastritis with H. Pylori but no metaplasia, dysplasia, malignancy, celiac disease, eosinophilic or lymphocytic esophagitis. Continues to endorse sore throat and productive cough. Has been afebrile and has no white count. Likely 2/2 irritation from EGD. Started on H. Pylori treatment yesterday per GI recs. No improvement in appetite yet. Will continue to monitor. --GI consulted, recommended quadruple therapy (Day 2/10)      --Bismuth 524 mg four times daily      --Flagyl 250 mg four times daily      --Tetracycline 500 mg four times daily      --Continue pantoprazole 40mg  twice daily --Outpatient GI follow-up scheduled for 03/28/2020 --Advance diet as tolerated --Chloraseptic spray for 72-96h for sore throat  --RD consulted, appreciate recommendations --Ensure Enlive po TID; MVI; Magic cup TID with meals --LR 100cc/hr --Daily CBC   #Advanced dementia (Moca of 13/30), chronic #History of temporal lobe epilepsy, chronic Patient reportedly not experiencing episodes of confusion or hallucinations over the last two days. CT head and MRI of brain unremarkable  for an acute or chronic process. HIV, RPR and Vitamin B12 within normal limits/negative. TSH prior to admission within normal limits. EEG revealing mild to moderate diffuse encephalopathy. Patient has poor baseline cognitive function and likely a component of  depression contributing to her presentation. --Continue mirtazapine daily at bedtime --Continue seroquel 300 mg daily at bedtime --Continue Keppra 500 mg BID --Consider neuropsychological testing on an outpatient basis --Continue follow-up with neurology in outpatient setting   #AKI on CKD3a, resolved Creatinine stable at 0.83 today --Continue losartan 100 mg daily --Continue holding HCTZ --Continue LR 100cc/hr --Daily BMP   #HTN, chronic Stable today at 121/67. Continue to monitor. --Continue losartan --Continue holding HCTZ   #Hypokalemia, active Potassium improved is 4.0 today. Will continue to monitor. --Daily BMP   #Sciatica, chronic #Fibromyalgia, chronic #Osteoarthritis, chronic --Norco 0.5-1 tablet increased to q6hrs --Acetaminophen Q6H PRN  #Code status: Full code #Diet: Soft diet #VTE ppx: Lovenox 40 mg BID #IVF: LR 100cc/hr #PT/OT recs: SNF, supervision/assistance 24 hours. Equipment: wheelchair, 3in1  Prior to Admission Living Arrangement: Home Anticipated Discharge Location: SNF Barriers to Discharge: SNF placement, pending insurance auth Dispo: Anticipated discharge in approximately 1-2 day(s).   Signed: Lacinda Axon, MD 02/01/2020, 6:07 AM  Pager: [phone] Internal Medicine Teaching Service After 5pm on weekdays and 1pm on weekends: On Call pager: [phone]

## 2020-02-01 NOTE — Progress Notes (Signed)
Occupational Therapy Treatment Patient Details Name: Heather Horton MRN: [id number] DOB: [birth year] Today's Date: 02/01/2020    History of present illness Pt is a 74 y/o F presenting to ED on 11/27 for weakness that caused her legs to give out on her and decreased responsiveness. Pt has had increased falls over the past 2 months with loss of appetite/weight loss. Pt found to now have AKI, mild diffuse encepalopathy, and dementia with visual hallucinations. PMH includes hepatitis C, GERD, HTN, hyperlipidemia, prior cervical cancer, chronic cervical and LBP, and seizures (on Keppra).   OT comments  Pt. Seen for skilled OT treatment session.  Focus of session completion of adls while incorporating attention to task.  Pt. Requires mod Cues for initiation and completion of task.  Easily distracted by self mid task.  Unable to recall with cues what I had just instructed her to do.  Ie: finish pulling up sock. Agree with current recommendations for post acute care of snf/24S.  Follow Up Recommendations  SNF;Supervision/Assistance - 24 hour    Equipment Recommendations  None recommended by OT    Recommendations for Other Services      Precautions / Restrictions Precautions Precautions: Fall Restrictions Weight Bearing Restrictions: No       Mobility Bed Mobility Overal bed mobility: Needs Assistance Bed Mobility: Supine to Sit     Supine to sit: Min guard        Transfers Overall transfer level: Needs assistance Equipment used: Rolling walker (2 wheeled) Transfers: Stand Pivot Transfers Sit to Stand: Min assist;Min guard Stand pivot transfers: Min assist       General transfer comment: pt performed sit to stand from bed min assist and stand pivoted to commode; pt then performed 2 trials of 4 steps with 2 sitting breaks and was min assist for trunk elevation after each sitting break; 3x sit to stand was performed after gait and pt went from min assist for trunk elevation x1 to min  guard for the other 2x    Balance Overall balance assessment: Needs assistance Sitting-balance support: Feet supported;No upper extremity supported;Bilateral upper extremity supported Sitting balance-Leahy Scale: Fair Sitting balance - Comments: pt able to have no UE support but preferred bilat UE support     Standing balance-Leahy Scale: Poor Standing balance comment: pt with reliance on bil UE support on RW and was bouncing in standing                           ADL either performed or assessed with clinical judgement   ADL Overall ADL's : Needs assistance/impaired                     Lower Body Dressing: Minimal assistance;Cueing for sequencing;Cueing for compensatory techniques;Sitting/lateral leans Lower Body Dressing Details (indicate cue type and reason): don/doff socks -cues for sequencing, completion of task.  easily self distracted                     Vision       Perception     Praxis      Cognition Arousal/Alertness: Awake/alert Behavior During Therapy: WFL for tasks assessed/performed Overall Cognitive Status: Impaired/Different from baseline Area of Impairment: Attention;Safety/judgement;Memory;Following commands;Awareness;Problem solving                 Orientation Level: Disoriented to;Time Current Attention Level: Sustained;Focused Memory: Decreased recall of precautions;Decreased short-term memory Following Commands: Follows one step commands inconsistently;Follows one step  commands with increased time Safety/Judgement: Decreased awareness of safety;Decreased awareness of deficits Awareness: Emergent Problem Solving: Slow processing;Difficulty sequencing;Decreased initiation;Requires verbal cues;Requires tactile cues General Comments: pt easily distracted; needed cues to initiate, continue with, and complete task.  tv off and decreased conversation but pt. was unable to complete task without cues.        Exercises   guided pt. Through 5 sets of chair push ups as she states her legs were feeling achy and weak.  Max hand over hand demo for tech. And completion.  Pt. Unable to recall the exercises at end of session.     Shoulder Instructions       General Comments      Pertinent Vitals/ Pain       Pain Assessment: Faces Pain Score: 10-Worst pain ever Faces Pain Scale: Hurts a little bit Pain Location: upper L leg at hip region Pain Descriptors / Indicators: Aching Pain Intervention(s): Limited activity within patient's tolerance;Monitored during session;Repositioned  Home Living                                          Prior Functioning/Environment              Frequency  Min 2X/week        Progress Toward Goals  OT Goals(current goals can now be found in the care plan section)  Progress towards OT goals: Progressing toward goals     Plan Discharge plan remains appropriate;Frequency remains appropriate    Co-evaluation                 AM-PAC OT "6 Clicks" Daily Activity     Outcome Measure   Help from another person eating meals?: A Little Help from another person taking care of personal grooming?: A Little Help from another person toileting, which includes using toliet, bedpan, or urinal?: A Lot Help from another person bathing (including washing, rinsing, drying)?: A Lot Help from another person to put on and taking off regular upper body clothing?: A Little Help from another person to put on and taking off regular lower body clothing?: A Lot 6 Click Score: 15    End of Session    OT Visit Diagnosis: Cognitive communication deficit (R41.841)   Activity Tolerance Patient tolerated treatment well   Patient Left in chair;with call bell/phone within reach;with chair alarm set   Nurse Communication          Time: [phone] OT Time Calculation (min): 14 min  Charges: OT General Charges $OT Visit: 1 Visit OT Treatments $Self Care/Home  Management : 8-22 mins  Sonia Baller, COTA/L Acute Rehabilitation [phone]   Janice Coffin 02/01/2020, 11:36 AM

## 2020-02-01 NOTE — Plan of Care (Signed)
  Problem: Education: Goal: Knowledge of General Education information will improve Description: Including pain rating scale, medication(s)/side effects and non-pharmacologic comfort measures Outcome: Not Progressing   Problem: Health Behavior/Discharge Planning: Goal: Ability to manage health-related needs will improve Outcome: Not Progressing   Problem: Clinical Measurements: Goal: Ability to maintain clinical measurements within normal limits will improve Outcome: Not Progressing Goal: Will remain free from infection Outcome: Not Progressing Goal: Diagnostic test results will improve Outcome: Not Progressing Goal: Respiratory complications will improve Outcome: Not Progressing Goal: Cardiovascular complication will be avoided Outcome: Not Progressing   Problem: Activity: Goal: Risk for activity intolerance will decrease Outcome: Not Progressing   Problem: Nutrition: Goal: Adequate nutrition will be maintained Outcome: Not Progressing   Problem: Coping: Goal: Level of anxiety will decrease Outcome: Not Progressing   Problem: Elimination: Goal: Will not experience complications related to bowel motility Outcome: Not Progressing Goal: Will not experience complications related to urinary retention Outcome: Not Progressing   Problem: Pain Managment: Goal: General experience of comfort will improve Outcome: Not Progressing   

## 2020-02-01 NOTE — Progress Notes (Signed)
Physical Therapy Treatment Patient Details Name: Heather Horton MRN: [id number] DOB: 10/10/1945 Today's Date: 02/01/2020    History of Present Illness Pt is a 74 y/o F presenting to ED on 11/27 for weakness that caused her legs to give out on her and decreased responsiveness. Pt has had increased falls over the past 2 months with loss of appetite/weight loss. Pt found to now have AKI, mild diffuse encepalopathy, and dementia with visual hallucinations. PMH includes hepatitis C, GERD, HTN, hyperlipidemia, prior cervical cancer, chronic cervical and LBP, and seizures (on Keppra).    PT Comments    Pt showed improvement and less need for assistance with bed mobility, transfers, and ambulation, but pt remains unsafe for mobility on own secondary to buckling in the LE, need for cueing of safe hand placement during transfers, and need for frequent rest breaks. Pt able to ambulate this session but needed a close chair follow and cues to push through the walker. Pt remains appropriate for SNF placement secondary to limited ambulatory ability.  Follow Up Recommendations  SNF;Supervision/Assistance - 24 hour     Equipment Recommendations  3in1 (PT);Wheelchair (measurements PT);Wheelchair cushion (measurements PT)    Recommendations for Other Services       Precautions / Restrictions Precautions Precautions: Fall Restrictions Weight Bearing Restrictions: No    Mobility  Bed Mobility Overal bed mobility: Needs Assistance Bed Mobility: Supine to Sit     Supine to sit: Min guard        Transfers Overall transfer level: Needs assistance Equipment used: Rolling walker (2 wheeled) Transfers: Stand Pivot Transfers Sit to Stand: Min assist;Min guard Stand pivot transfers: Min assist       General transfer comment: pt performed sit to stand from bed min assist and stand pivoted to commode; pt then performed 2 trials of 4 steps with 2 sitting breaks and was min assist for trunk elevation  after each sitting break; 3x sit to stand was performed after gait and pt went from min assist for trunk elevation x1 to min guard for the other 2x  Ambulation/Gait Ambulation/Gait assistance: Min guard Gait Distance (Feet): 1 Feet Assistive device: Rolling walker (2 wheeled) Gait Pattern/deviations: Shuffle;Decreased step length - left;Decreased step length - right;Decreased stride length;Step-to pattern   Gait velocity interpretation: <1.31 ft/sec, indicative of household ambulator General Gait Details: pt performed 2 trials of 4 steps (1 foot each time) min guard for safety with sitting rest breaks in between; pts knees kept bouncing/giving out so close chair follow was performed; pt was cued to push through the walker to support her LEs   Stairs             Wheelchair Mobility    Modified Rankin (Stroke Patients Only)       Balance Overall balance assessment: Needs assistance Sitting-balance support: Feet supported;No upper extremity supported;Bilateral upper extremity supported Sitting balance-Leahy Scale: Fair Sitting balance - Comments: pt able to have no UE support but preferred bilat UE support     Standing balance-Leahy Scale: Poor Standing balance comment: pt with reliance on bil UE support on RW and was bouncing in standing                            Cognition Arousal/Alertness: Awake/alert Behavior During Therapy: WFL for tasks assessed/performed Overall Cognitive Status: Impaired/Different from baseline Area of Impairment: Attention;Safety/judgement;Memory;Following commands;Awareness;Problem solving  Current Attention Level: Sustained;Focused (pt alternated between focused and sustained, would get distracted at times and start talking abuot something else) Memory: Decreased recall of precautions;Decreased short-term memory Following Commands: Follows one step commands inconsistently;Follows one step commands with  increased time Safety/Judgement: Decreased awareness of safety;Decreased awareness of deficits Awareness: Emergent Problem Solving: Slow processing;Difficulty sequencing;Decreased initiation;Requires verbal cues;Requires tactile cues General Comments: pt easily distracted; pt needed to be reminded to push up from bed/chair instead of walker each time      Exercises      General Comments        Pertinent Vitals/Pain Pain Assessment: 0-10 Pain Score: 10-Worst pain ever Pain Location: top of stomach Pain Descriptors / Indicators: Cramping Pain Intervention(s): Limited activity within patient's tolerance;Monitored during session;Repositioned    Home Living                      Prior Function            PT Goals (current goals can now be found in the care plan section) Progress towards PT goals: Progressing toward goals    Frequency    Min 2X/week      PT Plan Current plan remains appropriate    Co-evaluation              AM-PAC PT "6 Clicks" Mobility   Outcome Measure  Help needed turning from your back to your side while in a flat bed without using bedrails?: A Little Help needed moving from lying on your back to sitting on the side of a flat bed without using bedrails?: A Little Help needed moving to and from a bed to a chair (including a wheelchair)?: A Lot Help needed standing up from a chair using your arms (e.g., wheelchair or bedside chair)?: A Little Help needed to walk in hospital room?: A Lot Help needed climbing 3-5 steps with a railing? : Total 6 Click Score: 14    End of Session Equipment Utilized During Treatment: Gait belt Activity Tolerance: Patient tolerated treatment well Patient left: in chair;with call bell/phone within reach;with chair alarm set   PT Visit Diagnosis: Other abnormalities of gait and mobility (R26.89);Difficulty in walking, not elsewhere classified (R26.2);Muscle weakness (generalized) (M62.81);Unsteadiness on  feet (R26.81);Pain Pain - part of body:  (stomach)     Time: 5701-7793 PT Time Calculation (min) (ACUTE ONLY): 27 min  Charges:  $Therapeutic Activity: 23-37 mins                     Caleb Popp, SPT [id number]  Sarim Rothman 02/01/2020, 10:36 AM

## 2020-02-01 NOTE — Progress Notes (Signed)
Nutrition Follow-up  DOCUMENTATION CODES:   Obesity unspecified, Non-severe (moderate) malnutrition in context of chronic illness  INTERVENTION:   Magic cup BID with meals, each supplement provides 290 kcal and 9 grams of protein  Ensure Enlive po BID, each supplement provides 350 kcal and 20 grams of protein  MVI with minerals   NUTRITION DIAGNOSIS:   Moderate Malnutrition related to chronic illness (esophageal dysmotility) as evidenced by energy intake < or equal to 75% for > or equal to 1 month, mild fat depletion, moderate fat depletion, mild muscle depletion, moderate muscle depletion.  Ongoing  GOAL:   Patient will meet greater than or equal to 90% of their needs  Ongoing  MONITOR:   PO intake, Supplement acceptance, Labs, Weight trends, Skin, I & O's  REASON FOR ASSESSMENT:   Consult Assessment of nutrition requirement/status, Poor PO  ASSESSMENT:   Heather Horton is 74 year old female with history of acute encephalopathy, cervical cancer s/p hysterectomy, chronic constipation, GERD, seizure, and chronic back pain on chronic opiate therapy presenting for weakness and loss of appetite with associated weight loss, found to have an AKI and electrolyte abnormalities.  Pt admitted with AI, loss of appetite, and weight loss.  11/28- s/p BSE- regular diet with thin liquids.  11/30- EGD- advanced to soft diet  Spoke with pt at bedside. Pt stated she has no appetite but is wanting to eat. Pt stated she finds it difficult to eat because she has lost the desire for food. Pt reports eating very little off her tray during this admission but says she has been making herself drink one, sometimes one and a half, Ensures a day. Pt stated she does not eat the Magic Cups on her tray because she does not like chocolate flavored items. Pt stated family members have brought in her favorite foods cooked at home in an attempt to increase her po intake and she cannot eat it because she is not  hungry. Pt expressed frustrations over po intake and overall fatigue.  Pt underwent EGD on 22/02 without complications. Diet advanced to soft as tolerated.  Pt denies any difficulty swallowing or drinking, although pt later stated she has had occasional coughing when swallowing food over the last 2 days. Pt stated this morning she coughed up bloody sputum. She has not experienced anymore since breakfast. Noted SLP evaluation, pt okay to eat from oropharyngeal standpoint. Will follow symptoms.    Reviewed wt hx; Per pt, her UBW is 199 lbs. Per chart review, pt has experienced a ~10 lb weight gain during this admission since her most recent weight on 11/8. Noted no distant history of weight loss.    Diet Order:   Diet Order            DIET SOFT Room service appropriate? Yes; Fluid consistency: Thin  Diet effective now                 EDUCATION NEEDS:   Education needs have been addressed  Skin:  Skin Assessment: Reviewed RN Assessment  Last BM:  01/31/20  Height:   Ht Readings from Last 1 Encounters:  01/26/20 5\' 5"  (1.651 m)    Weight:   Wt Readings from Last 1 Encounters:  01/28/20 85 kg    Ideal Body Weight:  56.8 kg  BMI:  Body mass index is 31.18 kg/m.  Estimated Nutritional Needs:   Kcal:  1800-2000  Protein:  110-125 grams  Fluid:  > 1.8 L   Ronnald Nian,  Dietetic Intern Pager: [phone] If unavailable: [phone]

## 2020-02-02 DIAGNOSIS — I10 Essential (primary) hypertension: Secondary | ICD-10-CM

## 2020-02-02 DIAGNOSIS — K2970 Gastritis, unspecified, without bleeding: Secondary | ICD-10-CM

## 2020-02-02 DIAGNOSIS — B9681 Helicobacter pylori [H. pylori] as the cause of diseases classified elsewhere: Secondary | ICD-10-CM

## 2020-02-02 DIAGNOSIS — F0281 Dementia in other diseases classified elsewhere with behavioral disturbance: Secondary | ICD-10-CM

## 2020-02-02 LAB — CBC
HCT: 26.9 % — ABNORMAL LOW (ref 36.0–46.0)
Hemoglobin: 8.9 g/dL — ABNORMAL LOW (ref 12.0–15.0)
MCH: 31.3 pg (ref 26.0–34.0)
MCHC: 33.1 g/dL (ref 30.0–36.0)
MCV: 94.7 fL (ref 80.0–100.0)
Platelets: 290 10*3/uL (ref 150–400)
RBC: 2.84 MIL/uL — ABNORMAL LOW (ref 3.87–5.11)
RDW: 16 % — ABNORMAL HIGH (ref 11.5–15.5)
WBC: 8.9 10*3/uL (ref 4.0–10.5)
nRBC: 0.2 % (ref 0.0–0.2)

## 2020-02-02 LAB — BASIC METABOLIC PANEL
Anion gap: 13 (ref 5–15)
BUN: 13 mg/dL (ref 8–23)
CO2: 15 mmol/L — ABNORMAL LOW (ref 22–32)
Calcium: 9 mg/dL (ref 8.9–10.3)
Chloride: 109 mmol/L (ref 98–111)
Creatinine, Ser: 1.03 mg/dL — ABNORMAL HIGH (ref 0.44–1.00)
GFR, Estimated: 57 mL/min — ABNORMAL LOW (ref >60)
Glucose, Bld: 112 mg/dL — ABNORMAL HIGH (ref 70–99)
Potassium: 3.7 mmol/L (ref 3.5–5.1)
Sodium: 137 mmol/L (ref 135–145)

## 2020-02-02 LAB — SARS CORONAVIRUS 2 BY RT PCR (HOSPITAL ORDER, PERFORMED IN ~~LOC~~ HOSPITAL LAB): SARS Coronavirus 2: NEGATIVE

## 2020-02-02 MED ORDER — DICLOFENAC SODIUM 1 % EX GEL
2.0000 g | Freq: Four times a day (QID) | CUTANEOUS | Status: DC
  Administered 2020-02-02 – 2020-02-04 (×9): 2 g via TOPICAL
  Filled 2020-02-02: qty 100

## 2020-02-02 MED ORDER — PHENOL 1.4 % MT LIQD
1.0000 | Freq: Every day | OROMUCOSAL | Status: DC | PRN
  Filled 2020-02-02: qty 177

## 2020-02-02 NOTE — Plan of Care (Signed)

## 2020-02-02 NOTE — TOC Progression Note (Signed)
Transition of Care [facility]) - Progression Note    Patient Details  Name: Heather Horton MRN: [id number] Date of Birth: 11/21/1945  Transition of Care [facility]) CM/SW [place], [place] Phone Number: 02/02/2020, 3:05 PM  Clinical Narrative:    CSW received update from [place] that pt's insurance denied the request for SNF placement. Dr. Ree Kida for peer to peer. CSW spoke with pt and daughter Heather Horton, on speaker phone. Appeal info was given and process explained. CSW will follow up with more information when any is available. Pt stated they may be able to work out [facility] if necessary.   Expected Discharge Plan: [place] Barriers to Discharge: Continued Medical Work up, Ship broker, SNF Pending bed offer  Expected Discharge Plan and Services Expected Discharge Plan: [place] Choice: [place] arrangements for the past 2 months: Single Family Home                                       Social Determinants of Health (SDOH) Interventions    Readmission Risk Interventions No flowsheet data found.

## 2020-02-02 NOTE — Progress Notes (Signed)
Subjective:   Hospital day: 7  Overnight event: NAEOV  This AM, Patient was founding sitting in chair talking on the phone with her daughter. Patient reports ongoing leg pain with worsening pain and warmth around her knees and thighs. She still have a productive cough with pinkish phlegm and sore throat.   Objective:  Vital signs in last 24 hours: Vitals:   02/01/20 2124 02/01/20 2200 02/01/20 2300 02/02/20 0031  BP: (!) 99/40   122/66  Pulse: (!) 102 (!) 102 (!) 101 100  Resp: (!) 21 (!) 21  18  Temp: 99 F (37.2 C)   98.6 F (37 C)  TempSrc: Oral     SpO2: 100% 98% 97% 98%  Weight:      Height:        Physical Exam  General: Pleasant, well-appearing elderly woman laying in bed. No acute distress. Head: Normocephalic. Atraumatic. Mouth: MMM. No erythema or exudate.  CV: Mild tachycardic. Regular rhythm. No murmurs, rubs, or gallops. No LE edema. Pulmonary: Lungs CTAB. Normal effort. No wheezing or rales.  Abdominal: Soft, nondistended. Mild ttp of the epigastric area. Normal bowel sounds. Extremities: Palpable pulses. Normal ROM. Skin: Warm and dry. No obvious rash or lesions. Neuro: A&Ox3. Moves all extremities. Normal sensation. No focal deficit. Psych: Normal mood and affect  Assessment/Plan: Heather Horton is a 74 y.o. female with history of cervical cancer, smoking, HTN, CKD 3a, temporal lobe seizure, OA, fibromyalgia, and sciatica who presented to [facility] on 01/26/2020 with progressive weakness and poor appetite and found to have dysphagia and H. Pylori gastritis. Currently on Quadruple therapy w/ bismuth and pending SNF placement.   Principal Problem:   AKI (acute kidney injury) ([place]) Active Problems:   Esophageal reflux   Fibrocystic breast disease   Generalized anxiety disorder   Esophageal dysphagia   Unintentional weight loss of more than 10 pounds   Temporal lobe epilepsy (HCC)   Aortic atherosclerosis (HCC)   Malnutrition of moderate degree    Helicobacter pylori gastritis  #Dysphagia, active #Chronic Gastritis 2/2 H Pylori infection Patient underwent EGD without complication, however she does continues to have some sore throat. Patient was noted to have no gross lesions proximally, however distally there was benign-appearing esophageal stenosis. Dilation was performed of the esophagus. Associated findings include 2cm hiatal hernia and gastritis. Multiple biopsies were sent to pathology. Biopsy results showed evidence of chronic gastritis with H. Pylori but no metaplasia, dysplasia, malignancy, celiac disease, eosinophilic or lymphocytic esophagitis. No significant improvement in sore throat or productive cough. Has been afebrile and has no white count. Likely 2/2 irritation from EGD. Started on H. Pylori treatment per GI recs. No significant improvement in appetite. Will continue to monitor. --GI consulted, recommended quadruple therapy (Day 3/10)      --Bismuth 524 mg four times daily      --Flagyl 250 mg four times daily      --Tetracycline 500 mg four times daily      --Continue pantoprazole 40mg  twice daily --Outpatient GI follow-up scheduled for 03/28/2020 --Advance diet as tolerated --Chloraseptic spray for sore throat daily prn --RD consulted, appreciate recommendations --Ensure Enlive po TID; MVI; Magic cup TID with meals --LR 100cc/hr --Daily CBC   #Advanced dementia (Moca of 13/30), chronic #History of temporal lobe epilepsy, chronic Patient reportedly not experiencing episodes of confusion or hallucinations over the last two days. CT head and MRI of brain unremarkable for an acute or chronic process. HIV, RPR and Vitamin B12 within normal limits/negative. TSH  prior to admission within normal limits. EEG revealing mild to moderate diffuse encephalopathy. Patient has poor baseline cognitive function and likely a component of depression contributing to her presentation. A&Ox3 this morning. --Continue mirtazapine daily at  bedtime --Continue seroquel 300 mg daily at bedtime --Continue Keppra 500 mg BID --Consider neuropsychological testing on an outpatient basis --Continue follow-up with neurology in outpatient setting   #AKI on CKD3a, resolved Mild AKI with creatinine of 1.03 from 0.91 today. On fluids. Will continue to monitor. Likely an element of dehydration. Low threshold to give fluid bolus if BP drops.  --Continue losartan 100 mg daily --Continue holding HCTZ --Continue LR 100cc/hr --Daily BMP   #HTN, chronic Stable today at 122/66. Continue to monitor. --Continue losartan --Continue holding HCTZ   #Hypokalemia, active Potassium improved is 3.7 today. Will continue to monitor. --Daily BMP --Restart KCl if K+ drops below 3.5   #Sciatica, chronic #Fibromyalgia, chronic #Osteoarthritis, chronic Patient complains of worsening pain around her knees and thighs with associated warmth. Knee exam did not show any signs of effusion or trauma. Mild ttp of the thighs. Likely MSK-related pain vs. Chronic fibromyalgia/osteoarthritis. Will give voltaen gel and monitor. --Continue Norco 0.5-1 tablet q6hrs --Acetaminophen Q6H PRN --Voltaren 1% gel  #Code status: Full code #Diet: Advance as tolerated #VTE ppx: Lovenox 40 mg BID #IVF: LR 100cc/hr #PT/OT recs: SNF, supervision/assistance 24 hours. Equipment: wheelchair, 3in1  Prior to Admission Living Arrangement: Home Anticipated Discharge Location: SNF Barriers to Discharge: SNF placement, pending insurance auth Dispo: Anticipated discharge in approximately 1-2 day(s).   Signed: Lacinda Axon, MD 02/02/2020, 5:25 AM  Pager: [phone] Internal Medicine Teaching Service After 5pm on weekdays and 1pm on weekends: On Call pager: [phone]

## 2020-02-02 NOTE — Plan of Care (Signed)
  Problem: Education: °Goal: Knowledge of General Education information will improve °Description: Including pain rating scale, medication(s)/side effects and non-pharmacologic comfort measures °Outcome: Not Progressing °  °Problem: Health Behavior/Discharge Planning: °Goal: Ability to manage health-related needs will improve °Outcome: Not Progressing °  °Problem: Clinical Measurements: °Goal: Ability to maintain clinical measurements within normal limits will improve °Outcome: Not Progressing °Goal: Will remain free from infection °Outcome: Not Progressing °Goal: Diagnostic test results will improve °Outcome: Not Progressing °Goal: Respiratory complications will improve °Outcome: Not Progressing °Goal: Cardiovascular complication will be avoided °Outcome: Not Progressing °  °Problem: Nutrition: °Goal: Adequate nutrition will be maintained °Outcome: Not Progressing °  °Problem: Coping: °Goal: Level of anxiety will decrease °Outcome: Not Progressing °  °

## 2020-02-03 NOTE — Plan of Care (Signed)

## 2020-02-03 NOTE — Progress Notes (Signed)
HD#7 Subjective:  Overnight Events: none   Resting comfortably in bed.  She denies any new complaints today.  Objective:  Vital signs in last 24 hours: Vitals:   02/02/20 1610 02/02/20 2012 02/03/20 0013 02/03/20 0618  BP: (!) 101/59 104/63 107/67 132/61  Pulse: 99 97 (!) 106 99  Resp:  18 19 (!) 23  Temp:  (!) 97.4 F (36.3 C) 97.7 F (36.5 C) 98.5 F (36.9 C)  TempSrc:  Oral Oral Oral  SpO2:  100% 100% 100%  Weight:      Height:       Supplemental O2: Room Air SpO2: 100 %   Physical Exam:  Physical Exam Constitutional:      Appearance: Normal appearance.  HENT:     Head: Normocephalic and atraumatic.  Eyes:     Extraocular Movements: Extraocular movements intact.  Cardiovascular:     Rate and Rhythm: Normal rate.     Pulses: Normal pulses.     Heart sounds: Normal heart sounds.  Pulmonary:     Effort: Pulmonary effort is normal.     Breath sounds: Normal breath sounds.  Abdominal:     General: Bowel sounds are normal.     Palpations: Abdomen is soft.     Tenderness: There is no abdominal tenderness.  Musculoskeletal:        General: Normal range of motion.     Cervical back: Normal range of motion.     Right lower leg: No edema.     Left lower leg: No edema.  Skin:    General: Skin is warm and dry.  Neurological:     Mental Status: She is alert and oriented to person, place, and time. Mental status is at baseline.  Psychiatric:        Mood and Affect: Mood normal.     Filed Weights   01/26/20 2214 01/27/20 0500 01/28/20 0500  Weight: 82.6 kg 85.3 kg 85 kg     Intake/Output Summary (Last 24 hours) at 02/03/2020 1128 Last data filed at 02/02/2020 2014 Gross per 24 hour  Intake 240 ml  Output --  Net 240 ml   Net IO Since Admission: 1,614.67 mL [02/03/20 1128]  Pertinent Labs: CBC Latest Ref Rng & Units 02/02/2020 02/01/2020 01/31/2020  WBC 4.0 - 10.5 K/uL 8.9 9.0 9.8  Hemoglobin 12.0 - 15.0 g/dL 8.9(L) 9.7(L) 9.1(L)  Hematocrit 36 - 46 %  26.9(L) 28.1(L) 26.7(L)  Platelets 150 - 400 K/uL 290 308 289    CMP Latest Ref Rng & Units 02/02/2020 02/01/2020 01/31/2020  Glucose 70 - 99 mg/dL 112(H) 108(H) 111(H)  BUN 8 - 23 mg/dL 13 10 7(L)  Creatinine 0.44 - 1.00 mg/dL 1.03(H) 0.91 0.83  Sodium 135 - 145 mmol/L 137 137 138  Potassium 3.5 - 5.1 mmol/L 3.7 4.0 4.3  Chloride 98 - 111 mmol/L 109 108 109  CO2 22 - 32 mmol/L 15(L) 15(L) 18(L)  Calcium 8.9 - 10.3 mg/dL 9.0 9.6 9.8  Total Protein 6.5 - 8.1 g/dL - - -  Total Bilirubin 0.3 - 1.2 mg/dL - - -  Alkaline Phos 38 - 126 U/L - - -  AST 15 - 41 U/L - - -  ALT 0 - 44 U/L - - -    Imaging: No results found.  Assessment/Plan:   Principal Problem:   AKI (acute kidney injury) ([place]) Active Problems:   Esophageal reflux   Fibrocystic breast disease   Generalized anxiety disorder   Esophageal dysphagia  Unintentional weight loss of more than 10 pounds   Temporal lobe epilepsy (HCC)   Aortic atherosclerosis (HCC)   Malnutrition of moderate degree   Helicobacter pylori gastritis   Patient Summary: Heather Horton is a 74 y.o. female with history of cervical cancer, smoking, HTN, CKD 3a, temporal lobe seizure, OA, fibromyalgia, and sciatica who presented to [facility] on 01/26/2020 with progressive weakness and poor appetite and found to have dysphagia and H. Pylori gastritis. Currently on Quadruple therapy w/ bismuth and pending SNF placement.   Principal Problem:   AKI (acute kidney injury) ([place]) Active Problems:   Esophageal reflux   Fibrocystic breast disease   Generalized anxiety disorder   Esophageal dysphagia   Unintentional weight loss of more than 10 pounds   Temporal lobe epilepsy (HCC)   Aortic atherosclerosis (HCC)   Malnutrition of moderate degree   Helicobacter pylori gastritis  #Dysphagia, active #Chronic Gastritis 2/2 H Pylori infection Patient s/p EGD with dilation. Biopsy showing H pylori. On day 4 of quadruple therapy and tolerating it well. Continues  to have mild irritation from procedure and appetite slowly improving. --GI consulted, recommended quadruple therapy (Day 4/10) -Appreciate GIs assistance comanaging this patient. -Continue Chloraseptic Spray for throat irritation -Continue advancing patient's diet as tolerated and supplementing diet with Ensure  #Advanced dementia (Moca of 13/30), chronic #History of temporal lobe epilepsy, chronic -Continue mirtazapine nightly, Seroquel 300 mg nightly, Keppra 500 mg twice daily  #AKI on CKD3a, resolved Resolved  #HTN, chronic Stable today at 122/66. Continue to monitor. --Continue losartan --Continue holding HCTZ  #Hypokalemia, active Potassium improved is 3.7 today. Will continue to monitor. --Daily BMP --Restart KCl if K+ drops below 3.5  #Sciatica, chronic #Fibromyalgia, chronic #Osteoarthritis, chronic Patient complains of worsening pain around her knees and thighs with associated warmth. Knee exam did not show any signs of effusion or trauma. Mild ttp of the thighs. Likely MSK-related pain vs. Chronic fibromyalgia/osteoarthritis. Will give voltaen gel and monitor. --Continue Norco 0.5-1 tablet q6hrs --Acetaminophen Q6H PRN --Voltaren 1% gel  Diet: Advance as tolerated IVF: None,None VTE: Enoxaparin Code: Full PT/OT recs: SNF for Subacute PT TOC recs: none   Dispo: Anticipated discharge to Skilled nursing facility pending placement.    Please contact the on call pager after 5 pm and on weekends at [phone].

## 2020-02-04 MED ORDER — ACETAMINOPHEN 325 MG PO TABS: 650.0000 mg | ORAL_TABLET | Freq: Four times a day (QID) | ORAL | 0 refills | Status: AC | PRN

## 2020-02-04 MED ORDER — DICLOFENAC SODIUM 1 % EX GEL: 2.0000 g | Freq: Four times a day (QID) | CUTANEOUS | 1 refills | Status: AC

## 2020-02-04 MED ORDER — TETRACYCLINE HCL 500 MG PO CAPS: 500.0000 mg | ORAL_CAPSULE | Freq: Four times a day (QID) | ORAL | 0 refills | Status: AC

## 2020-02-04 MED ORDER — LACTATED RINGERS IV BOLUS: 250.0000 mL | Freq: Once | INTRAVENOUS | Status: DC

## 2020-02-04 MED ORDER — METRONIDAZOLE 250 MG PO TABS: 250.0000 mg | ORAL_TABLET | Freq: Four times a day (QID) | ORAL | 0 refills | Status: AC

## 2020-02-04 MED ORDER — SODIUM BICARBONATE 650 MG PO TABS: 650.0000 mg | ORAL_TABLET | Freq: Two times a day (BID) | ORAL | 0 refills | Status: AC

## 2020-02-04 MED ORDER — BISMUTH SUBSALICYLATE 262 MG PO CHEW: 524.0000 mg | CHEWABLE_TABLET | Freq: Four times a day (QID) | ORAL | 0 refills | Status: AC

## 2020-02-04 MED ORDER — PANTOPRAZOLE SODIUM 40 MG PO TBEC: 40.0000 mg | DELAYED_RELEASE_TABLET | Freq: Two times a day (BID) | ORAL | 0 refills | Status: DC

## 2020-02-04 NOTE — Progress Notes (Signed)
Physical Therapy Treatment Patient Details Name: Heather Horton MRN: [id number] DOB: August 31, 1945 Today's Date: 02/04/2020    History of Present Illness Pt is a 74 y/o F presenting to ED on 11/27 for weakness that caused her legs to give out on her and decreased responsiveness. Pt has had increased falls over the past 2 months with loss of appetite/weight loss. Pt found to now have AKI, mild diffuse encepalopathy, and dementia with visual hallucinations. PMH includes hepatitis C, GERD, HTN, hyperlipidemia, prior cervical cancer, chronic cervical and LBP, and seizures (on Keppra).    PT Comments    Pt showed increased gait distance today with less bilateral leg buckling until the last 20 feet of ambulation. Pt showed only need for guarding during sit to stands but continues to have short term memory problems of remembering cueing for safe hand placement. Pt d/c recommendation changed to [facility] secondary to insurance not covering SNF. Pt shows improvements towards home mobility distances but still unsafe secondary to unpredictable buckling of legs.     Follow Up Recommendations  Home health PT;Supervision/Assistance - 24 hour     Equipment Recommendations  3in1 (PT);Rolling walker with 5" wheels    Recommendations for Other Services       Precautions / Restrictions Precautions Precautions: Fall Restrictions Weight Bearing Restrictions: No    Mobility  Bed Mobility Overal bed mobility: Needs Assistance             General bed mobility comments: pt sitting EOB upon arrival  Transfers Overall transfer level: Needs assistance Equipment used: Rolling walker (2 wheeled)   Sit to Stand: Min guard         General transfer comment: pt min guard for sit to stand from bed; pt then performed 5x sit to stand min guardfrom recliner with cues needed each time for safe hand placement  Ambulation/Gait Ambulation/Gait assistance: Min guard;Min assist Gait Distance (Feet): 76 Feet Assistive  device: Rolling walker (2 wheeled) Gait Pattern/deviations: Decreased step length - left;Decreased step length - right;Decreased stride length;Step-to pattern   Gait velocity interpretation: <1.8 ft/sec, indicate of risk for recurrent falls General Gait Details: pt was able to perform gait with chair follow min guard for about 56 feet then started bilateral knees begin to start to buckle, so pt was min assist for close guarding and safety   Stairs             Wheelchair Mobility    Modified Rankin (Stroke Patients Only)       Balance Overall balance assessment: Needs assistance Sitting-balance support: Feet supported;No upper extremity supported Sitting balance-Leahy Scale: Fair     Standing balance support: Bilateral upper extremity supported Standing balance-Leahy Scale: Poor Standing balance comment: pt with reliance on bil UE support on RW during static stance                            Cognition Arousal/Alertness: Awake/alert Behavior During Therapy: WFL for tasks assessed/performed Overall Cognitive Status: Impaired/Different from baseline Area of Impairment: Attention;Safety/judgement;Memory;Following commands;Awareness;Problem solving                   Current Attention Level: Focused Memory: Decreased recall of precautions;Decreased short-term memory Following Commands: Follows one step commands inconsistently;Follows one step commands with increased time Safety/Judgement: Decreased awareness of safety;Decreased awareness of deficits Awareness: Intellectual Problem Solving: Slow processing;Difficulty sequencing;Decreased initiation;Requires verbal cues;Requires tactile cues General Comments: pt been stating she has been getting out of bed  on her own and going to the bathroom without nursing      Exercises General Exercises - Lower Extremity Long Arc Quad: AROM;Both;15 reps;Seated Hip Flexion/Marching: AROM;Both;Seated;15 reps     General Comments        Pertinent Vitals/Pain Pain Assessment: Faces Faces Pain Scale: Hurts little more Pain Location: back Pain Descriptors / Indicators: Aching Pain Intervention(s): Limited activity within patient's tolerance;Monitored during session    Home Living                      Prior Function            PT Goals (current goals can now be found in the care plan section) Acute Rehab PT Goals Patient Stated Goal: return home PT Goal Formulation: With patient Potential to Achieve Goals: Fair Progress towards PT goals: Progressing toward goals    Frequency    Min 3X/week      PT Plan Discharge plan needs to be updated    Co-evaluation              AM-PAC PT "6 Clicks" Mobility   Outcome Measure  Help needed turning from your back to your side while in a flat bed without using bedrails?: A Little Help needed moving from lying on your back to sitting on the side of a flat bed without using bedrails?: A Little Help needed moving to and from a bed to a chair (including a wheelchair)?: A Little Help needed standing up from a chair using your arms (e.g., wheelchair or bedside chair)?: A Little Help needed to walk in hospital room?: A Little Help needed climbing 3-5 steps with a railing? : A Lot 6 Click Score: 17    End of Session Equipment Utilized During Treatment: Gait belt Activity Tolerance: Patient tolerated treatment well Patient left: in chair;with call bell/phone within reach;with chair alarm set Nurse Communication: Mobility status PT Visit Diagnosis: Other abnormalities of gait and mobility (R26.89);Difficulty in walking, not elsewhere classified (R26.2);Muscle weakness (generalized) (M62.81);Unsteadiness on feet (R26.81);Pain     Time: 0312-8118 PT Time Calculation (min) (ACUTE ONLY): 23 min  Charges:  $Gait Training: 8-22 mins $Therapeutic Exercise: 8-22 mins                     Caleb Popp, SPT [id number]   Heather Horton 02/04/2020, 1:28 PM

## 2020-02-04 NOTE — Progress Notes (Addendum)
Subjective:   Hospital day: #9  Overnight event: NAEOV  This AM, patient feeling well today. Daughter came to visit and was able to eat some BBQ ribs she brought. States her appetite is improving. Discussed plan to get insurance to cover rehab vs having home health PT. Discussed differences in the options with patient and daughter over the phone. Patient prefers to go home [facility] with PT at this time. Daughter also agreeable to staying with patient once discharged her help her at home. Patient denies any SOB, abd pain but continues to have leg pains.   Objective:  Vital signs in last 24 hours: Vitals:   02/02/20 2012 02/03/20 0013 02/03/20 0618 02/03/20 2300  BP: 104/63 107/67 132/61 (!) 96/57  Pulse: 97 (!) 106 99 100  Resp: 18 19 (!) 23 16  Temp: (!) 97.4 F (36.3 C) 97.7 F (36.5 C) 98.5 F (36.9 C) 98.4 F (36.9 C)  TempSrc: Oral Oral Oral Oral  SpO2: 100% 100% 100% 100%  Weight:      Height:        Physical Exam  General: Pleasant, well-appearing elderly woman laying in bed. No acute distress. Head: Normocephalic. Atraumatic. Mouth: MMM. No erythema or exudate.  CV: Mild tachycardic. Regular rhythm. No murmurs, rubs, or gallops. No LE edema. Pulmonary: Lungs CTAB. Normal effort. No wheezing or rales.  Abdominal: Soft, nondistended. Nontender. Normal bowel sounds. Extremities: Palpable pulses. Normal ROM. Skin: Warm and dry. No obvious rash or lesions. Neuro: A&Ox3. Moves all extremities. Normal sensation. No focal deficit. Psych: Normal mood and affect  Assessment/Plan: Heather Horton is a 74 y.o. female with history of cervical cancer, smoking, HTN, CKD 3a, temporal lobe seizure, OA, fibromyalgia, and sciatica who presented to [facility] on 01/26/2020 with progressive weakness and poor appetite and found to have dysphagia and H. Pylori gastritis. Currently on Quadruple therapy w/ bismuth. Plan for discharge today.  Principal Problem:   AKI (acute kidney injury)  ([place]) Active Problems:   Esophageal reflux   Fibrocystic breast disease   Generalized anxiety disorder   Esophageal dysphagia   Unintentional weight loss of more than 10 pounds   Temporal lobe epilepsy (HCC)   Aortic atherosclerosis (HCC)   Malnutrition of moderate degree   Helicobacter pylori gastritis  #Dysphagia, active #Chronic Gastritis 2/2 H Pylori infection Patient s/p EGD with dilation. Biopsy showing chronic gastritis w/ H pylori. On day 5 of quadruple therapy and tolerating it well. Continues to have mild irritation from procedure. Making great improvement in appetite. She was able to eat ribs cooked by her daughter yesterday. Insurance rejected SNF placement but patient okay with going home with home health. Plan for discharge today with [facility] PT/OT/Aide --Continue quadruple therapy for 5 more days at home      --Bismuth 524 mg four times daily      --Flagyl 250 mg four times daily      --Tetracycline 500 mg four times daily      --Continue pantoprazole 40mg  twice daily --Home health to help with strength, use of wheelchair and ADLs --Outpatient GI follow-up scheduled for 03/28/2020   #Advanced dementia ([place] of 13/30), chronic #History of temporal lobe epilepsy, chronic CT head and MRI of brain unremarkable for an acute or chronic process. HIV, RPR and Vitamin B12 within normal limits/negative. TSH prior to admission within normal limits. EEG revealing mild to moderate diffuse encephalopathy. Patient has poor baseline cognitive function and likely a component of depression contributing to her presentation. A&Ox3 throughout admission. --  Continue mirtazapine daily at bedtime --Continue seroquel 300 mg daily at bedtime --Continue Keppra 500 mg BID --Consider neuropsychological testing on an outpatient basis --Continue follow-up with neurology in outpatient setting   #AKI on CKD3a, resolved Will follow up with PCP to check BMP. Holding antihypertensives due to soft BP. Bicarb  trended down to 15 today. Will discharge with Sodium Bicarb prescription. --Sodium bicarb 650 mg BID  --Follow up with PCP for adjustments   #HTN, chronic BP soft this morning at 93/63. Improved to 128/69 before discharge. Still soft in the afternoon so giving 250 mL LR bolus. Will hold anti-hypertensive at discharge with plan to follow up with PCP. --Hold losartan and HCTZ   #Hypokalemia, resolved Will continue to monitor. Will follow up with PCP to check BMP.   #Sciatica, chronic #Fibromyalgia, chronic #Osteoarthritis, chronic Patient complains of worsening pain around her knees and thighs with associated warmth. Knee exam did not show any signs of effusion or trauma. Mild ttp of the thighs. Likely MSK-related pain vs. Chronic fibromyalgia/osteoarthritis. Added Voltaren gel to help with leg pain. --Continue home Norco --Continue Acetaminophen Q6H PRN --Continue Voltaren 1% gel  #Code status: Full code #Diet: Advance as tolerated #VTE ppx: Lovenox 40 mg BID #IVF: LR 100cc/hr #PT/OT recs: SNF, supervision/assistance 24 hours. Equipment: wheelchair, 3in1  Prior to Admission Living Arrangement: Home Anticipated Discharge Location: Home w/ Home health Barriers to Discharge: None Dispo: Anticipated discharge today.  Signed: Lacinda Axon, MD 02/04/2020, 6:00 AM  Pager: [phone] Internal Medicine Teaching Service After 5pm on weekdays and 1pm on weekends: On Call pager: [phone]

## 2020-02-04 NOTE — TOC Transition Note (Signed)
Transition of Care [facility]) - CM/SW Discharge Note   Patient Details  Name: Heather Horton MRN: [id number] Date of Birth: [birth year]  Transition of Care [facility]) CM/SW Contact:  Joanne Chars, LCSW Phone Number: 02/04/2020, 2:57 PM   Clinical Narrative:  Pt discharging home today with [facility].  3N1 and wheelchair provided by [employer].  Daughter Heather Horton will transport pt home.  No other needs identified.      Final next level of care: [place] Barriers to Discharge: Barriers Resolved   Patient Goals and CMS Choice Patient states their goals for this hospitalization and ongoing recovery are:: Pt unable to participate in DC planning due to disorientation, dtr agreeable to SNF placement   Choice offered to / list presented to : Adult Children  Discharge Placement                       Discharge Plan and Services     Post Acute Care Choice: [place]          DME Arranged: Wheelchair manual DME Agency: [employer] Date DME Agency Contacted: 02/04/20 Time DME Agency Contacted: [id number] Representative spoke with at DME Agency: [place]: PT, OT [place] Agency: [facility] [employer]) Date [place]: 02/04/20 Time [place]: [place] Representative spoke with at [place]: [place] ([place]) Interventions     Readmission Risk Interventions No flowsheet data found.

## 2020-02-04 NOTE — Plan of Care (Signed)

## 2020-02-04 NOTE — Progress Notes (Signed)
Patient suffers from dysphagia, h pylori gastritis and general deconditioning which impairs their ability to perform daily activities like bathing, dressing, feeding, grooming, and toileting in the home. A cane, crutch, or walker will not resolve issue with performing activities of daily living. A wheelchair will allow patient to safely perform daily activities. Patient can safely propel the wheelchair in the home or has a caregiver who can provide assistance. Length of need 6 months .   Lurline Idol, MSW, LCSW 12/6/202112:59 PM

## 2020-02-04 NOTE — Discharge Instructions (Signed)
Heather Horton,  It was a pleasure taking care of you at [place] were diagnosed with H pylori gastritis and this was the main reason for your loss of appetite and stomach symptoms. We are glad your appetite is improving. Please continue to take the rest of the medications to treat the infection. We are also sending you home with home health PT/OT/Aide to help you with your strength and mobility. Make sure to follow the intructions below: 1) Continue your Tetracycline, Flagyl, Bismuth and Protonix for 5 more days 2) Start taking Sodium bicarb 650 mg twice a day 3) Hold your losartan and HCTZ until you see your primary care doctor 3) Schedule a hospital follow up with your primary care doctor next week.  4) You have an appointment with GI early in January 28th, 2022, make sure to go to that appointment.  Take care,  Dr. Linwood Dibbles, MD, MPH

## 2020-02-04 NOTE — TOC Progression Note (Signed)
Transition of Care [facility]) - Progression Note    Patient Details  Name: Heather Horton MRN: [id number] Date of Birth: February 08, 1946  Transition of Care [facility]) CM/SW Contact  Heather Chars, LCSW Phone Number: 02/04/2020, 10:23 AM  Clinical Narrative:   CSW spoke with pt regarding insurance denial, pt wanting to go home with [facility], states she lives alone but her daughter and her daugher's fiancee would be available to provide 24 hour support.  CSW also spoke with daughter Heather Horton by phone and she is in agreement with this plan as well, also confirms that family can provide 24 hour support.      Expected Discharge Plan: [place] Barriers to Discharge: Continued Medical Work up, Ship broker, SNF Pending bed offer  Expected Discharge Plan and Services Expected Discharge Plan: [place] Choice: [place] arrangements for the past 2 months: Single Family Home                                       Social Determinants of Health (SDOH) Interventions    Readmission Risk Interventions No flowsheet data found.

## 2020-02-05 ENCOUNTER — Telehealth: Payer: Self-pay | Admitting: *Deleted

## 2020-02-05 NOTE — Telephone Encounter (Addendum)
Information was sent through CoverMyMeds to [facility] for Diclofenac Gel 1%.  Awaiting determination within 1-3 days.  [phone] for questions.  Sander Nephew, RN 02/05/2020 8:48AM.  Additional information was provided to [facility] .  Case # [id number].  Awaiting redetermination within 72 hours.  Sander Nephew, RN 02/07/2020 9:50 AM.   Joylene Igo from [place]  PA for Diclofenac Gel approved 03/02/2019 through 02/28/2021.   Sander Nephew, RN 02/08/2020 11:28 AM.

## 2020-02-26 ENCOUNTER — Other Ambulatory Visit: Payer: Self-pay | Admitting: Student

## 2020-03-24 ENCOUNTER — Other Ambulatory Visit: Payer: Self-pay | Admitting: Physician Assistant

## 2020-03-24 DIAGNOSIS — Z1231 Encounter for screening mammogram for malignant neoplasm of breast: Secondary | ICD-10-CM

## 2020-03-28 ENCOUNTER — Encounter: Payer: Self-pay | Admitting: Gastroenterology

## 2020-03-28 ENCOUNTER — Ambulatory Visit: Payer: Medicare HMO | Admitting: Gastroenterology

## 2020-03-28 VITALS — BP 122/80 | HR 81 | Ht 65.0 in

## 2020-03-28 DIAGNOSIS — B9681 Helicobacter pylori [H. pylori] as the cause of diseases classified elsewhere: Secondary | ICD-10-CM | POA: Diagnosis not present

## 2020-03-28 DIAGNOSIS — K2970 Gastritis, unspecified, without bleeding: Secondary | ICD-10-CM | POA: Diagnosis not present

## 2020-03-28 NOTE — Progress Notes (Signed)
Review of pertinent gastrointestinal problems: 1.  H. pylori positive gastritis.  EGD November 2021 Dr. Rush Landmark; indication dysphagia.  Findings mild benign stenosis GE junction.  Biopsies were taken from the esophagus, the GE junction, the stomach, the duodenum.  Savary dilator was passed which caused mucosal rent in proximal and distal esophagus.  Biopsies from her stomach proved to be positive for H. pylori and she was put on Pylera type antibiotics and PPI twice daily.   HPI: This is a very pleasant 75 year old woman who is here with her daughter today.  Patient has some baseline confusion and the daughter provides a lot of her history.  Both of them agree that she is having no dysphagia or eating problems since that she completed the H. pylori antibiotic treatment.  She is actually eating better.  She is not losing weight.  She is not taking pantoprazole as it is listed below in her medicine list.  She is actually taking 40 mg pills 1 pill at night every night.  She has been on it for years.  ROS: complete GI ROS as described in HPI, all other review negative.  Constitutional:  No unintentional weight loss   Past Medical History:  Diagnosis Date  . Acute encephalopathy   . Allergy   . Anemia   . Anxiety   . Arthritis   . Asthma   . Blood transfusion without reported diagnosis   . Bronchitis   . Bursitis   . Cervical cancer ([place])   . Chronic constipation   . Chronic hepatitis C without hepatic coma ([place]) 10/17/2014   11/10/2015 visit with Dr. Linus Salmons. Repeat labs revealed resolution of infection following treatment.  [place], [employer], FNP-C; last seen 01/2014 Genotype 1b Diagnosed 01/2013  . Colon polyps   . Fibrocystic breast disease   . Galactorrhea on right side   . GERD (gastroesophageal reflux disease)   . Hepatitis C   . Hyperlipidemia   . Hypertension   . Insomnia   . Osteoporosis   . Respiratory failure ([place]) 03/20/2017   . Seizures ([place])     Past Surgical History:  Procedure Laterality Date  . ABDOMINAL HYSTERECTOMY  30's   due to cervical cancer  . APPENDECTOMY    . BIOPSY  01/29/2020   Procedure: BIOPSY;  Surgeon: Rush Landmark Telford Nab., Heather Horton;  Location: [place];  Service: Gastroenterology;;  . ESOPHAGOGASTRODUODENOSCOPY (EGD) WITH PROPOFOL N/A 01/29/2020   Procedure: ESOPHAGOGASTRODUODENOSCOPY (EGD) WITH PROPOFOL;  Surgeon: Irving Copas., Heather Horton;  Location: [place];  Service: Gastroenterology;  Laterality: N/A;  . SAVORY DILATION N/A 01/29/2020   Procedure: [place] Setting DILATION;  Surgeon: Irving Copas., Heather Horton;  Location: [place];  Service: Gastroenterology;  Laterality: N/A;    Current Outpatient Medications  Medication Sig Dispense Refill  . acetaminophen (TYLENOL) 325 MG tablet Take 2 tablets (650 mg total) by mouth every 6 (six) hours as needed for mild pain (or Fever >/= 101). 60 tablet 0  . albuterol (PROVENTIL HFA;VENTOLIN HFA) 108 (90 Base) MCG/ACT inhaler Inhale 2 puffs into the lungs every 6 (six) hours as needed for wheezing. 1 Inhaler 1  . atorvastatin (LIPITOR) 10 MG tablet Take 1 tablet (10 mg total) by mouth daily. 90 tablet 3  . diclofenac Sodium (VOLTAREN) 1 % GEL Apply 2 g topically 4 (four) times daily. 50 g 1  . ergocalciferol (VITAMIN D2) 1.25 MG (50000 UT) capsule Take 50,000 Units by mouth every Wednesday.     Marland Kitchen  folic acid (FOLVITE) 967 MCG tablet Take 800 mcg by mouth in the morning.    . gabapentin (NEURONTIN) 300 MG capsule Take 300 mg by mouth See admin instructions. Take 300mg  qHS 1 week, 300mg  BID 1 week, 300mg  TID then on    . HYDROcodone-acetaminophen (NORCO) 10-325 MG tablet Take 0.5-1 tablets by mouth in the morning and at bedtime.     Marland Kitchen ketoconazole (NIZORAL) 2 % cream Apply 1 application topically as needed for irritation.     . levETIRAcetam (KEPPRA) 500 MG tablet TAKE 1 TABLET BY MOUTH TWICE A DAY (Patient taking differently: Take 500 mg by  mouth 2 (two) times daily.) 120 tablet 0  . loratadine (CLARITIN) 10 MG tablet Take 10 mg by mouth daily as needed for allergies or rhinitis.    . mirtazapine (REMERON) 7.5 MG tablet Take 7.5 mg by mouth at bedtime.     . pantoprazole (PROTONIX) 40 MG tablet Take 1 tablet (40 mg total) by mouth 2 (two) times daily before a meal for 5 days. (Patient taking differently: Take 40 mg by mouth daily.) 10 tablet 0  . QUEtiapine (SEROQUEL) 300 MG tablet Take 1 tablet (300 mg total) by mouth at bedtime. 90 tablet 3  . tetrahydrozoline-zinc (VISINE-AC) 0.05-0.25 % ophthalmic solution Place 1-2 drops into both eyes 3 (three) times daily as needed (for irritation).     . diazepam (VALIUM) 5 MG tablet Take 1 tablet 30 minutes prior to MRI. May take second dose if needed. (Patient not taking: Reported on 03/28/2020) 2 tablet 0  . hydrOXYzine (ATARAX/VISTARIL) 10 MG tablet Take 10 mg by mouth daily as needed for anxiety.  (Patient not taking: Reported on 03/28/2020)     No current facility-administered medications for this visit.    Allergies as of 03/28/2020 - Review Complete 03/28/2020  Allergen Reaction Noted  . Molds & smuts Other (See Comments) 04/17/2015  . Pollen extract Other (See Comments) 04/17/2015  . Aspirin Nausea And Vomiting and Rash 11/19/2011    Family History  Problem Relation Age of Onset  . Asthma Mother   . Colon cancer Sister 28  . Stroke Brother 30  . Stroke Sister 19  . Cancer Sister 19       lung cancer, +TOBACCO  . Arthritis Daughter   . Pulmonary embolism Daughter 39       on chronic warfarin  . Hypertension Son 70  . Esophageal cancer Neg Hx   . Rectal cancer Neg Hx   . Stomach cancer Neg Hx     Social History   Socioeconomic History  . Marital status: Single    Spouse name: n/a  . Number of children: 3  . Years of education: 14+  . Highest education level: Not on file  Occupational History  . Occupation: retired    Comment: phlebotomy, EKG tech; taught both   Tobacco Use  . Smoking status: Current Every Day Smoker    Packs/day: 1.00    Years: 40.00    Pack years: 40.00    Types: Cigarettes  . Smokeless tobacco: Never Used  . Tobacco comment: I'm scared of e-cigarettes, interested in patches, tobacco info given 01/10/15  Vaping Use  . Vaping Use: Never used  Substance and Sexual Activity  . Alcohol use: Not Currently  . Drug use: No  . Sexual activity: Not Currently  Other Topics Concern  . Not on file  Social History Narrative   Raised in [place], [place].   Moved to [place], [place]  and then came to [facility] in [place].   Lives alone.   Daughter lives around the corner.    Raised her niece as her own daughter, and she lives nearby.   Son lives in [place].   Right handed   Social Determinants of Health   Financial Resource Strain: Not on file  Food Insecurity: Not on file  Transportation Needs: Not on file  Physical Activity: Not on file  Stress: Not on file  Social Connections: Not on file  Intimate Partner Violence: Not on file     Physical Exam: Pulse 81   Ht 5\' 5"  (1.651 m)   SpO2 97%   BMI 31.18 kg/m  Constitutional: generally well-appearing Psychiatric: alert and oriented x3, certainly confused however Abdomen: soft, nontender, nondistended, no obvious ascites, no peritoneal signs, normal bowel sounds No peripheral edema noted in lower extremities  Assessment and plan: 75 y.o. female with H. pylori gastritis, baseline GERD  Her dysphagia and anorexia symptoms have significantly improved since treatment for H. pylori about 2 months ago.  She is going to stop her proton pump inhibitor for the next 7 to 10 days and then she will complete an H. pylori stool antigen testing to confirm eradication.  She understands she can resume her proton pump inhibitor as she has been taking it previously after she drops that stool test off.  Please see the "Patient Instructions" section for addition details about the plan.  Heather Loffler, Heather Horton [facility] 03/28/2020, 8:53 AM   Total time on date of encounter was 25 minutes (this included time spent preparing to see the patient reviewing records; obtaining and/or reviewing separately obtained history; performing a medically appropriate exam and/or evaluation; counseling and educating the patient and family if present; ordering medications, tests or procedures if applicable; and documenting clinical information in the health record).

## 2020-03-28 NOTE — Patient Instructions (Signed)
If you are age 74 or older, your body mass index should be between 23-30. Your Body mass index is 31.18 kg/m. If this is out of the aforementioned range listed, please consider follow up with your Primary Care Provider.  Your provider has requested that you go to the basement level for lab work before leaving today. Press "B" on the elevator. The lab is located at the first door on the left as you exit the elevator.    Please hold pantoprazole for 10 days then you can complete lab tests and return them to our lab.  Thank you for entrusting me with your care and choosing [facility].  Dr Ardis Hughs

## 2020-04-17 ENCOUNTER — Other Ambulatory Visit: Payer: Medicare HMO

## 2020-04-17 DIAGNOSIS — K2970 Gastritis, unspecified, without bleeding: Secondary | ICD-10-CM

## 2020-04-17 DIAGNOSIS — B9681 Helicobacter pylori [H. pylori] as the cause of diseases classified elsewhere: Secondary | ICD-10-CM

## 2020-04-18 LAB — HELICOBACTER PYLORI  SPECIAL ANTIGEN
MICRO NUMBER:: 11547336
SPECIMEN QUALITY: ADEQUATE

## 2020-05-09 ENCOUNTER — Ambulatory Visit: Payer: Medicare HMO

## 2020-07-04 ENCOUNTER — Other Ambulatory Visit: Payer: Self-pay

## 2020-07-04 ENCOUNTER — Ambulatory Visit
Admission: RE | Admit: 2020-07-04 | Discharge: 2020-07-04 | Disposition: A | Discharge status: Home / Self Care | Payer: Medicare HMO | Source: Ambulatory Visit | Attending: Physician Assistant | Admitting: Physician Assistant

## 2020-07-04 DIAGNOSIS — I739 Peripheral vascular disease, unspecified: Secondary | ICD-10-CM

## 2020-07-04 DIAGNOSIS — Z1231 Encounter for screening mammogram for malignant neoplasm of breast: Secondary | ICD-10-CM

## 2020-07-29 ENCOUNTER — Ambulatory Visit (INDEPENDENT_AMBULATORY_CARE_PROVIDER_SITE_OTHER): Payer: Medicare HMO | Admitting: Vascular Surgery

## 2020-07-29 ENCOUNTER — Telehealth: Payer: Self-pay

## 2020-07-29 ENCOUNTER — Other Ambulatory Visit: Payer: Self-pay

## 2020-07-29 ENCOUNTER — Encounter: Payer: Self-pay | Admitting: Vascular Surgery

## 2020-07-29 ENCOUNTER — Ambulatory Visit (HOSPITAL_COMMUNITY)
Admission: RE | Admit: 2020-07-29 | Discharge: 2020-07-29 | Disposition: A | Discharge status: Home / Self Care | Payer: Medicare HMO | Source: Ambulatory Visit | Attending: Vascular Surgery | Admitting: Vascular Surgery

## 2020-07-29 DIAGNOSIS — I739 Peripheral vascular disease, unspecified: Secondary | ICD-10-CM | POA: Insufficient documentation

## 2020-07-29 NOTE — Progress Notes (Signed)
Patient name: Heather [facility] MRN: [id number] DOB: 03-09-1945 Sex: female  REASON FOR CONSULT: Evaluate right leg pain and lower extremity swelling  HPI: Heather Horton is a 75 y.o. female, with history of anxiety, hypertension, hyperlipidemia, hepatitis C, stage III CKD that presents for evaluation of lower extremity swelling and right leg pain.  She states this has been an ongoing issue for the last year at least.  She does walk with a walker.  She states her right foot hurts her throughout the day and into the night at times.  Pain is into the bottom of the foot.  She denies any active tissue loss at this time.  In addition she complains about a fair amount of swelling and a varicosity on the right medial calf.  She did have home health screening that was evidence of PAD in the right leg.  She denies any previous lower extremity interventions.  Does smoke a pack a day.  Past Medical History:  Diagnosis Date  . Acute encephalopathy   . Allergy   . Anemia   . Anxiety   . Arthritis   . Asthma   . Blood transfusion without reported diagnosis   . Bronchitis   . Bursitis   . Cervical cancer ([place])   . Chronic constipation   . Chronic hepatitis C without hepatic coma ([place]) 10/17/2014   11/10/2015 visit with Dr. Linus Salmons. Repeat labs revealed resolution of infection following treatment.  W[place], [place], FNP-C; last seen 01/2014 Genotype 1b Diagnosed 01/2013  . Colon polyps   . Fibrocystic breast disease   . Galactorrhea on right side   . GERD (gastroesophageal reflux disease)   . Hepatitis C   . Hyperlipidemia   . Hypertension   . Insomnia   . Osteoporosis   . Respiratory failure ([place]) 03/20/2017  . Seizures ([place])     Past Surgical History:  Procedure Laterality Date  . ABDOMINAL HYSTERECTOMY  30's   due to cervical cancer  . APPENDECTOMY    . BIOPSY  01/29/2020   Procedure: BIOPSY;  Surgeon: Rush Landmark Telford Nab., MD;  Location: [facility];  Service: Gastroenterology;;  . ESOPHAGOGASTRODUODENOSCOPY (EGD) WITH PROPOFOL N/A 01/29/2020   Procedure: ESOPHAGOGASTRODUODENOSCOPY (EGD) WITH PROPOFOL;  Surgeon: Irving Copas., MD;  Location: [facility];  Service: Gastroenterology;  Laterality: N/A;  . SAVORY DILATION N/A 01/29/2020   Procedure: [place] Setting DILATION;  Surgeon: Irving Copas., MD;  Location: [facility];  Service: Gastroenterology;  Laterality: N/A;    Family History  Problem Relation Age of Onset  . Asthma Mother   . Colon cancer Sister 29  . Stroke Brother 56  . Stroke Sister 50  . Cancer Sister 84       lung cancer, +TOBACCO  . Arthritis Daughter   . Pulmonary embolism Daughter 75       on chronic warfarin  . Hypertension Son 6  . Esophageal cancer Neg Hx   . Rectal cancer Neg Hx   . Stomach cancer Neg Hx     SOCIAL HISTORY: Social History   Socioeconomic History  . Marital status: Single    Spouse name: n/a  . Number of children: 3  . Years of education: 14+  . Highest education level: Not on file  Occupational History  . Occupation: retired    Comment: phlebotomy, EKG tech; taught both  Tobacco Use  . Smoking status: Current Every Day Smoker    Packs/day: 0.50  Years: 40.00    Pack years: 20.00    Types: Cigarettes  . Smokeless tobacco: Never Used  . Tobacco comment: I'm scared of e-cigarettes, interested in patches, tobacco info given 01/10/15  Vaping Use  . Vaping Use: Never used  Substance and Sexual Activity  . Alcohol use: Not Currently  . Drug use: No  . Sexual activity: Not Currently  Other Topics Concern  . Not on file  Social History Narrative   Raised in [place], [place].   Moved to [place], [place] and then came to [place] in 2013.   Lives alone.   Daughter lives around the corner.    Raised her niece as her own daughter, and she lives nearby.   Son lives in [place].   Right handed   Social Determinants of Health   Financial Resource Strain:  Not on file  Food Insecurity: Not on file  Transportation Needs: Not on file  Physical Activity: Not on file  Stress: Not on file  Social Connections: Not on file  Intimate Partner Violence: Not on file    Allergies  Allergen Reactions  . Molds & Smuts Other (See Comments)    Per allergy test  . Pollen Extract Other (See Comments)    Per allergy test  . Aspirin Nausea And Vomiting and Rash    Current Outpatient Medications  Medication Sig Dispense Refill  . albuterol (PROVENTIL HFA;VENTOLIN HFA) 108 (90 Base) MCG/ACT inhaler Inhale 2 puffs into the lungs every 6 (six) hours as needed for wheezing. 1 Inhaler 1  . atorvastatin (LIPITOR) 10 MG tablet Take 1 tablet (10 mg total) by mouth daily. 90 tablet 3  . busPIRone (BUSPAR) 5 MG tablet Take 5 mg by mouth 3 (three) times daily.    . diazepam (VALIUM) 5 MG tablet Take 1 tablet 30 minutes prior to MRI. May take second dose if needed. 2 tablet 0  . diclofenac Sodium (VOLTAREN) 1 % GEL Apply 2 g topically 4 (four) times daily. 50 g 1  . ergocalciferol (VITAMIN D2) 1.25 MG (50000 UT) capsule Take 50,000 Units by mouth every Wednesday.     . folic acid (FOLVITE) 245 MCG tablet Take 800 mcg by mouth in the morning.    . gabapentin (NEURONTIN) 300 MG capsule Take 300 mg by mouth See admin instructions. Take 300mg  qHS 1 week, 300mg  BID 1 week, 300mg  TID then on    . HYDROcodone-acetaminophen (NORCO) 10-325 MG tablet Take 0.5-1 tablets by mouth in the morning and at bedtime.     . hydrOXYzine (ATARAX/VISTARIL) 10 MG tablet Take 10 mg by mouth daily as needed for anxiety.    Marland Kitchen ketoconazole (NIZORAL) 2 % cream Apply 1 application topically as needed for irritation.     . levETIRAcetam (KEPPRA) 500 MG tablet TAKE 1 TABLET BY MOUTH TWICE A DAY (Patient taking differently: Take 500 mg by mouth 2 (two) times daily.) 120 tablet 0  . loratadine (CLARITIN) 10 MG tablet Take 10 mg by mouth daily as needed for allergies or rhinitis.    . pantoprazole  (PROTONIX) 40 MG tablet Take 40 mg by mouth at bedtime.    Marland Kitchen QUEtiapine (SEROQUEL) 300 MG tablet Take 1 tablet (300 mg total) by mouth at bedtime. 90 tablet 3  . tetrahydrozoline-zinc (VISINE-AC) 0.05-0.25 % ophthalmic solution Place 1-2 drops into both eyes 3 (three) times daily as needed (for irritation).     . mirtazapine (REMERON) 7.5 MG tablet Take 7.5 mg by mouth at bedtime.  (Patient  not taking: Reported on 07/29/2020)     No current facility-administered medications for this visit.    REVIEW OF SYSTEMS:  [X]  denotes positive finding, [ ]  denotes negative finding Cardiac  Comments:  Chest pain or chest pressure:    Shortness of breath upon exertion:    Short of breath when lying flat:    Irregular heart rhythm:        Vascular    Pain in calf, thigh, or hip brought on by ambulation:    Pain in feet at night that wakes you up from your sleep:  x Right  Blood clot in your veins:    Leg swelling:         Pulmonary    Oxygen at home:    Productive cough:     Wheezing:         Neurologic    Sudden weakness in arms or legs:     Sudden numbness in arms or legs:     Sudden onset of difficulty speaking or slurred speech:    Temporary loss of vision in one eye:     Problems with dizziness:         Gastrointestinal    Blood in stool:     Vomited blood:         Genitourinary    Burning when urinating:     Blood in urine:        Psychiatric    Major depression:         Hematologic    Bleeding problems:    Problems with blood clotting too easily:        Skin    Rashes or ulcers:        Constitutional    Fever or chills:      PHYSICAL EXAM: Vitals:   07/29/20 1352  BP: (!) 172/93  Pulse: 76  Temp: 98.1 F (36.7 C)  TempSrc: Skin  SpO2: 96%  Weight: 179 lb 3.2 oz (81.3 kg)  Height: 5\' 5"  (1.651 m)    GENERAL: The patient is a well-nourished female, in no acute distress. The vital signs are documented above. CARDIAC: There is a regular rate and rhythm.   VASCULAR:  Palpable femoral pulses bilaterally Left DP palpable No palpable right pedal pulses Does have a right varicosity medial calf PULMONARY: There is good air exchange bilaterally without wheezing or rales. ABDOMEN: Soft and non-tender. MUSCULOSKELETAL: There are no major deformities or cyanosis. NEUROLOGIC: No focal weakness or paresthesias are detected. SKIN: There are no ulcers or rashes noted. PSYCHIATRIC: The patient has a normal affect.  DATA:   Indications: Claudication.   High Risk Factors: Hypertension, current smoker.    Performing Technologist: Delorise Shiner RVT     Examination Guidelines: A complete evaluation includes at minimum, Doppler  waveform signals and systolic blood pressure reading at the level of  bilateral  brachial, anterior tibial, and posterior tibial arteries, when vessel  segments  are accessible. Bilateral testing is considered an integral part of a  complete  examination. Photoelectric Plethysmograph (PPG) waveforms and toe systolic  pressure readings are included as required and additional duplex testing  as  needed. Limited examinations for reoccurring indications may be performed  as  noted.     ABI Findings:  +---------+------------------+-----+----------+--------+  Right  Rt Pressure (mmHg)IndexWaveform Comment   +---------+------------------+-----+----------+--------+  Brachial 155                      +---------+------------------+-----+----------+--------+  ATA   106        0.68            +---------+------------------+-----+----------+--------+  PTA   99        0.64 monophasic      +---------+------------------+-----+----------+--------+  DP                monophasic      +---------+------------------+-----+----------+--------+  Great Toe30        0.19             +---------+------------------+-----+----------+--------+   +---------+------------------+-----+--------+-------+  Left   Lt Pressure (mmHg)IndexWaveformComment  +---------+------------------+-----+--------+-------+  Brachial 155                     +---------+------------------+-----+--------+-------+  ATA   140        0.90           +---------+------------------+-----+--------+-------+  PTA   156        1.01 biphasic      +---------+------------------+-----+--------+-------+  DP                biphasic      +---------+------------------+-----+--------+-------+  Great Toe109        0.70           +---------+------------------+-----+--------+-------+   +-------+-----------+-----------+------------+------------+  ABI/TBIToday's ABIToday's TBIPrevious ABIPrevious TBI  +-------+-----------+-----------+------------+------------+  Right 0.68    0.19                  +-------+-----------+-----------+------------+------------+  Left  1.01    0.70                  +-------+-----------+-----------+------------+------------+         Summary:  Right: Resting right ankle-brachial index indicates moderate right lower  extremity arterial disease. The right toe-brachial index is abnormal. RT  great toe pressure = 30 mmHg.   Left: Resting left ankle-brachial index is within normal range. No  evidence of significant left lower extremity arterial disease. The left  toe-brachial index is normal. LT Great toe pressure = 109 mmHg.   Assessment/Plan:  75 year old female with multiple medical comorbidities presents for evaluation of a multitude of symptoms including foot pain in the right lower extremity as well as leg swelling.  In trying to decipher her symptoms, it sounds like the most bothersome issue at this time is pain in  the right foot and calf that at times keeps her awake at night.  She does have much more diminished flow in the right leg compared to the left and I cannot palpate any pulses in the right foot compared to an easily palpable dorsalis pedis in the left foot.  Certainly discussed that this could be peripheral arterial disease as underlying etiology.  I have recommended aortogram with lower extremity arteriogram and possible intervention.  Discussed we will address her leg swelling a later time likely with a venous reflux study but will address her arterial disease first.   [name], MD [facility]: [phone]

## 2020-07-29 NOTE — Telephone Encounter (Signed)
Opened in error

## 2020-08-14 ENCOUNTER — Ambulatory Visit (HOSPITAL_COMMUNITY)
Admission: RE | Admit: 2020-08-14 | Discharge: 2020-08-14 | Disposition: A | Discharge status: Home / Self Care | Payer: Medicare HMO | Attending: Vascular Surgery | Admitting: Vascular Surgery

## 2020-08-14 ENCOUNTER — Encounter (HOSPITAL_COMMUNITY)
Admission: RE | Disposition: A | Discharge status: Home / Self Care | Payer: Self-pay | Source: Home / Self Care | Attending: Vascular Surgery

## 2020-08-14 ENCOUNTER — Ambulatory Visit (HOSPITAL_BASED_OUTPATIENT_CLINIC_OR_DEPARTMENT_OTHER): Payer: Medicare HMO

## 2020-08-14 DIAGNOSIS — Z01810 Encounter for preprocedural cardiovascular examination: Secondary | ICD-10-CM

## 2020-08-14 DIAGNOSIS — E785 Hyperlipidemia, unspecified: Secondary | ICD-10-CM | POA: Diagnosis not present

## 2020-08-14 DIAGNOSIS — Z791 Long term (current) use of non-steroidal anti-inflammatories (NSAID): Secondary | ICD-10-CM | POA: Insufficient documentation

## 2020-08-14 DIAGNOSIS — Z79899 Other long term (current) drug therapy: Secondary | ICD-10-CM | POA: Insufficient documentation

## 2020-08-14 DIAGNOSIS — Z886 Allergy status to analgesic agent status: Secondary | ICD-10-CM | POA: Insufficient documentation

## 2020-08-14 DIAGNOSIS — F17210 Nicotine dependence, cigarettes, uncomplicated: Secondary | ICD-10-CM | POA: Insufficient documentation

## 2020-08-14 DIAGNOSIS — I129 Hypertensive chronic kidney disease with stage 1 through stage 4 chronic kidney disease, or unspecified chronic kidney disease: Secondary | ICD-10-CM | POA: Insufficient documentation

## 2020-08-14 DIAGNOSIS — F419 Anxiety disorder, unspecified: Secondary | ICD-10-CM | POA: Diagnosis not present

## 2020-08-14 DIAGNOSIS — N183 Chronic kidney disease, stage 3 unspecified: Secondary | ICD-10-CM | POA: Diagnosis not present

## 2020-08-14 DIAGNOSIS — I70221 Atherosclerosis of native arteries of extremities with rest pain, right leg: Secondary | ICD-10-CM

## 2020-08-14 HISTORY — PX: ABDOMINAL AORTOGRAM W/LOWER EXTREMITY: CATH118223

## 2020-08-14 LAB — POCT I-STAT, CHEM 8
BUN: 23 mg/dL (ref 8–23)
Calcium, Ion: 1.16 mmol/L (ref 1.15–1.40)
Chloride: 106 mmol/L (ref 98–111)
Creatinine, Ser: 0.8 mg/dL (ref 0.44–1.00)
Glucose, Bld: 102 mg/dL — ABNORMAL HIGH (ref 70–99)
HCT: 38 % (ref 36.0–46.0)
Hemoglobin: 12.9 g/dL (ref 12.0–15.0)
Potassium: 4.6 mmol/L (ref 3.5–5.1)
Sodium: 139 mmol/L (ref 135–145)
TCO2: 24 mmol/L (ref 22–32)

## 2020-08-14 SURGERY — ABDOMINAL AORTOGRAM W/LOWER EXTREMITY
Anesthesia: LOCAL | Laterality: Right

## 2020-08-14 MED ORDER — FENTANYL CITRATE (PF) 100 MCG/2ML IJ SOLN
INTRAMUSCULAR | Status: AC
  Filled 2020-08-14: qty 2

## 2020-08-14 MED ORDER — HEPARIN (PORCINE) IN NACL 1000-0.9 UT/500ML-% IV SOLN
INTRAVENOUS | Status: DC | PRN
  Administered 2020-08-14 (×2): 500 mL

## 2020-08-14 MED ORDER — LABETALOL HCL 5 MG/ML IV SOLN: 10.0000 mg | INTRAVENOUS | Status: DC | PRN

## 2020-08-14 MED ORDER — SODIUM CHLORIDE 0.9% FLUSH: 3.0000 mL | INTRAVENOUS | Status: DC | PRN

## 2020-08-14 MED ORDER — SODIUM CHLORIDE 0.9 % IV SOLN: INTRAVENOUS | Status: DC

## 2020-08-14 MED ORDER — LIDOCAINE HCL (PF) 1 % IJ SOLN
INTRAMUSCULAR | Status: DC | PRN
  Administered 2020-08-14: 18 mL

## 2020-08-14 MED ORDER — MIDAZOLAM HCL 2 MG/2ML IJ SOLN
INTRAMUSCULAR | Status: DC | PRN
  Administered 2020-08-14: 2 mg via INTRAVENOUS

## 2020-08-14 MED ORDER — ACETAMINOPHEN 325 MG PO TABS: 650.0000 mg | ORAL_TABLET | ORAL | Status: DC | PRN

## 2020-08-14 MED ORDER — SODIUM CHLORIDE 0.9 % IV SOLN: 250.0000 mL | INTRAVENOUS | Status: DC | PRN

## 2020-08-14 MED ORDER — FENTANYL CITRATE (PF) 100 MCG/2ML IJ SOLN
INTRAMUSCULAR | Status: DC | PRN
  Administered 2020-08-14: 50 ug via INTRAVENOUS

## 2020-08-14 MED ORDER — HEPARIN (PORCINE) IN NACL 1000-0.9 UT/500ML-% IV SOLN
INTRAVENOUS | Status: AC
  Filled 2020-08-14: qty 1000

## 2020-08-14 MED ORDER — LIDOCAINE HCL (PF) 1 % IJ SOLN
INTRAMUSCULAR | Status: AC
  Filled 2020-08-14: qty 30

## 2020-08-14 MED ORDER — IODIXANOL 320 MG/ML IV SOLN
INTRAVENOUS | Status: DC | PRN
  Administered 2020-08-14: 55 mL

## 2020-08-14 MED ORDER — HYDRALAZINE HCL 20 MG/ML IJ SOLN: 5.0000 mg | INTRAMUSCULAR | Status: DC | PRN

## 2020-08-14 MED ORDER — MIDAZOLAM HCL 2 MG/2ML IJ SOLN
INTRAMUSCULAR | Status: AC
  Filled 2020-08-14: qty 2

## 2020-08-14 MED ORDER — ONDANSETRON HCL 4 MG/2ML IJ SOLN: 4.0000 mg | Freq: Four times a day (QID) | INTRAMUSCULAR | Status: DC | PRN

## 2020-08-14 MED ORDER — SODIUM CHLORIDE 0.9% FLUSH: 3.0000 mL | Freq: Two times a day (BID) | INTRAVENOUS | Status: DC

## 2020-08-14 SURGICAL SUPPLY — 11 items
CATH OMNI FLUSH 5F 65CM (CATHETERS) ×2 IMPLANT
DEVICE VASC CLSR CELT ART 5 (Vascular Products) ×2 IMPLANT
KIT MICROPUNCTURE NIT STIFF (SHEATH) ×2 IMPLANT
KIT PV (KITS) ×2 IMPLANT
SHEATH PINNACLE 5F 10CM (SHEATH) ×2 IMPLANT
SHEATH PROBE COVER 6X72 (BAG) ×2 IMPLANT
SYR MEDRAD MARK V 150ML (SYRINGE) ×2 IMPLANT
TRANSDUCER W/STOPCOCK (MISCELLANEOUS) ×2 IMPLANT
TRAY PV CATH (CUSTOM PROCEDURE TRAY) ×2 IMPLANT
WIRE BENTSON .035X145CM (WIRE) ×2 IMPLANT
WIRE TORQFLEX AUST .018X40CM (WIRE) ×2 IMPLANT

## 2020-08-14 NOTE — H&P (Signed)
History and Physical Interval Note:  08/14/2020 9:52 AM  Heather Horton  has presented today for surgery, with the diagnosis of pad.  The various methods of treatment have been discussed with the patient and family. After consideration of risks, benefits and other options for treatment, the patient has consented to  Procedure(s): ABDOMINAL AORTOGRAM W/LOWER EXTREMITY (N/A) as a surgical intervention.  The patient's history has been reviewed, patient examined, no change in status, stable for surgery.  I have reviewed the patient's chart and labs.  Questions were answered to the patient's satisfaction.    Aortogram, right leg arteriogram.    [name]  Patient name: Heather Horton MRN: [id number]        DOB: 1945-09-07            Sex: female   REASON FOR CONSULT: Evaluate right leg pain and lower extremity swelling   HPI: Heather Horton is a 75 y.o. female, with history of anxiety, hypertension, hyperlipidemia, hepatitis C, stage III CKD that presents for evaluation of lower extremity swelling and right leg pain.  She states this has been an ongoing issue for the last year at least.  She does walk with a walker.  She states her right foot hurts her throughout the day and into the night at times.  Pain is into the bottom of the foot.  She denies any active tissue loss at this time.  In addition she complains about a fair amount of swelling and a varicosity on the right medial calf.  She did have home health screening that was evidence of PAD in the right leg.  She denies any previous lower extremity interventions.  Does smoke a pack a day.       Past Medical History:  Diagnosis Date   Acute encephalopathy     Allergy     Anemia     Anxiety     Arthritis     Asthma     Blood transfusion without reported diagnosis     Bronchitis     Bursitis     Cervical cancer ([place])     Chronic constipation     Chronic hepatitis C without hepatic coma ([place]) 10/17/2014    11/10/2015 visit with Dr.  Linus Salmons. Repeat labs revealed resolution of infection following treatment.  [facility], Florentina Addison, [place], FNP-C; last seen 01/2014 Genotype 1b Diagnosed 01/2013   Colon polyps     Fibrocystic breast disease     Galactorrhea on right side     GERD (gastroesophageal reflux disease)     Hepatitis C     Hyperlipidemia     Hypertension     Insomnia     Osteoporosis     Respiratory failure ([place]) 03/20/2017   Seizures ([place])             Past Surgical History:  Procedure Laterality Date   ABDOMINAL HYSTERECTOMY   30's    due to cervical cancer   APPENDECTOMY       BIOPSY   01/29/2020    Procedure: BIOPSY;  Surgeon: Irving Copas., MD;  Location: [facility];  Service: Gastroenterology;;   ESOPHAGOGASTRODUODENOSCOPY (EGD) WITH PROPOFOL N/A 01/29/2020    Procedure: ESOPHAGOGASTRODUODENOSCOPY (EGD) WITH PROPOFOL;  Surgeon: Irving Copas., MD;  Location: [place];  Service: Gastroenterology;  Laterality: N/A;   SAVORY DILATION N/A 01/29/2020    Procedure: Benson Setting DILATION;  Surgeon: Irving Copas., MD;  Location: [place];  Service: Gastroenterology;  Laterality:  N/A;           Family History  Problem Relation Age of Onset   Asthma Mother     Colon cancer Sister 50   Stroke Brother 71   Stroke Sister 66   Cancer Sister 10        lung cancer, +TOBACCO   Arthritis Daughter     Pulmonary embolism Daughter 52        on chronic warfarin   Hypertension Son 72   Esophageal cancer Neg Hx     Rectal cancer Neg Hx     Stomach cancer Neg Hx        SOCIAL HISTORY: Social History         Socioeconomic History   Marital status: Single      Spouse name: n/a   Number of children: 3   Years of education: 14+   Highest education level: Not on file  Occupational History   Occupation: retired      Comment: phlebotomy, EKG tech; taught both  Tobacco Use   Smoking status: Current Every Day Smoker      Packs/day: 0.50       Years: 40.00      Pack years: 20.00      Types: Cigarettes   Smokeless tobacco: Never Used   Tobacco comment: I'm scared of e-cigarettes, interested in patches, tobacco info given 01/10/15  Vaping Use   Vaping Use: Never used  Substance and Sexual Activity   Alcohol use: Not Currently   Drug use: No   Sexual activity: Not Currently  Other Topics Concern   Not on file  Social History Narrative    Raised in [place], [place].    Moved to [place], [place] and then came to [place] in 2013.    Lives alone.    Daughter lives around the corner.    Raised her niece as her own daughter, and she lives nearby.    Son lives in [place].    Right handed    Social Determinants of Health    Financial Resource Strain: Not on file  Food Insecurity: Not on file  Transportation Needs: Not on file  Physical Activity: Not on file  Stress: Not on file  Social Connections: Not on file  Intimate Partner Violence: Not on file           Allergies  Allergen Reactions   Molds & Smuts Other (See Comments)      Per allergy test   Pollen Extract Other (See Comments)      Per allergy test   Aspirin Nausea And Vomiting and Rash            Current Outpatient Medications  Medication Sig Dispense Refill   albuterol (PROVENTIL HFA;VENTOLIN HFA) 108 (90 Base) MCG/ACT inhaler Inhale 2 puffs into the lungs every 6 (six) hours as needed for wheezing. 1 Inhaler 1   atorvastatin (LIPITOR) 10 MG tablet Take 1 tablet (10 mg total) by mouth daily. 90 tablet 3   busPIRone (BUSPAR) 5 MG tablet Take 5 mg by mouth 3 (three) times daily.       diazepam (VALIUM) 5 MG tablet Take 1 tablet 30 minutes prior to MRI. May take second dose if needed. 2 tablet 0   diclofenac Sodium (VOLTAREN) 1 % GEL Apply 2 g topically 4 (four) times daily. 50 g 1   ergocalciferol (VITAMIN D2) 1.25 MG (50000 UT) capsule Take 50,000 Units by mouth every Wednesday.  folic acid (FOLVITE) 706 MCG tablet Take 800 mcg by mouth in the morning.        gabapentin (NEURONTIN) 300 MG capsule Take 300 mg by mouth See admin instructions. Take 300mg  qHS 1 week, 300mg  BID 1 week, 300mg  TID then on       HYDROcodone-acetaminophen (NORCO) 10-325 MG tablet Take 0.5-1 tablets by mouth in the morning and at bedtime.       hydrOXYzine (ATARAX/VISTARIL) 10 MG tablet Take 10 mg by mouth daily as needed for anxiety.       ketoconazole (NIZORAL) 2 % cream Apply 1 application topically as needed for irritation.       levETIRAcetam (KEPPRA) 500 MG tablet TAKE 1 TABLET BY MOUTH TWICE A DAY (Patient taking differently: Take 500 mg by mouth 2 (two) times daily.) 120 tablet 0   loratadine (CLARITIN) 10 MG tablet Take 10 mg by mouth daily as needed for allergies or rhinitis.       pantoprazole (PROTONIX) 40 MG tablet Take 40 mg by mouth at bedtime.       QUEtiapine (SEROQUEL) 300 MG tablet Take 1 tablet (300 mg total) by mouth at bedtime. 90 tablet 3   tetrahydrozoline-zinc (VISINE-AC) 0.05-0.25 % ophthalmic solution Place 1-2 drops into both eyes 3 (three) times daily as needed (for irritation).       mirtazapine (REMERON) 7.5 MG tablet Take 7.5 mg by mouth at bedtime.  (Patient not taking: Reported on 07/29/2020)        No current facility-administered medications for this visit.      REVIEW OF SYSTEMS:  [X]  denotes positive finding, [ ]  denotes negative finding Cardiac   Comments:  Chest pain or chest pressure:      Shortness of breath upon exertion:      Short of breath when lying flat:      Irregular heart rhythm:             Vascular      Pain in calf, thigh, or hip brought on by ambulation:      Pain in feet at night that wakes you up from your sleep:  x Right  Blood clot in your veins:      Leg swelling:             Pulmonary      Oxygen at home:      Productive cough:      Wheezing:             Neurologic      Sudden weakness in arms or legs:      Sudden numbness in arms or legs:      Sudden onset of difficulty speaking or slurred speech:       Temporary loss of vision in one eye:      Problems with dizziness:             Gastrointestinal      Blood in stool:      Vomited blood:             Genitourinary      Burning when urinating:      Blood in urine:             Psychiatric      Major depression:             Hematologic      Bleeding problems:      Problems with blood clotting too easily:  Skin      Rashes or ulcers:             Constitutional      Fever or chills:          PHYSICAL EXAM:    Vitals:    07/29/20 1352  BP: (!) 172/93  Pulse: 76  Temp: 98.1 F (36.7 C)  TempSrc: Skin  SpO2: 96%  Weight: 179 lb 3.2 oz (81.3 kg)  Height: 5\' 5"  (1.651 m)      GENERAL: The patient is a well-nourished female, in no acute distress. The vital signs are documented above. CARDIAC: There is a regular rate and rhythm. VASCULAR:  Palpable femoral pulses bilaterally Left DP palpable No palpable right pedal pulses Does have a right varicosity medial calf PULMONARY: There is good air exchange bilaterally without wheezing or rales. ABDOMEN: Soft and non-tender. MUSCULOSKELETAL: There are no major deformities or cyanosis. NEUROLOGIC: No focal weakness or paresthesias are detected. SKIN: There are no ulcers or rashes noted. PSYCHIATRIC: The patient has a normal affect.   DATA:    Indications: Claudication.   High Risk Factors: Hypertension, current smoker.     Performing Technologist: Delorise Shiner RVT      Examination Guidelines: A complete evaluation includes at minimum, Doppler  waveform signals and systolic blood pressure reading at the level of  bilateral  brachial, anterior tibial, and posterior tibial arteries, when vessel  segments  are accessible. Bilateral testing is considered an integral part of a  complete  examination. Photoelectric Plethysmograph (PPG) waveforms and toe systolic  pressure readings are included as required and additional duplex testing  as  needed.  Limited examinations for reoccurring indications may be performed  as  noted.      ABI Findings:  +---------+------------------+-----+----------+--------+  Right    Rt Pressure (mmHg)IndexWaveform  Comment   +---------+------------------+-----+----------+--------+  Brachial 155                                        +---------+------------------+-----+----------+--------+  ATA      106               0.68                     +---------+------------------+-----+----------+--------+  PTA      99                0.64 monophasic          +---------+------------------+-----+----------+--------+  DP                              monophasic          +---------+------------------+-----+----------+--------+  Great Toe30                0.19                     +---------+------------------+-----+----------+--------+   +---------+------------------+-----+--------+-------+  Left     Lt Pressure (mmHg)IndexWaveformComment  +---------+------------------+-----+--------+-------+  Brachial 155                                     +---------+------------------+-----+--------+-------+  ATA      140               0.90                  +---------+------------------+-----+--------+-------+  PTA      156               1.01 biphasic         +---------+------------------+-----+--------+-------+  DP                              biphasic         +---------+------------------+-----+--------+-------+  Great Toe109               0.70                  +---------+------------------+-----+--------+-------+   +-------+-----------+-----------+------------+------------+  ABI/TBIToday's ABIToday's TBIPrevious ABIPrevious TBI  +-------+-----------+-----------+------------+------------+  Right  0.68       0.19                                 +-------+-----------+-----------+------------+------------+  Left   1.01       0.70                                  +-------+-----------+-----------+------------+------------+          Summary:  Right: Resting right ankle-brachial index indicates moderate right lower  extremity arterial disease. The right toe-brachial index is abnormal. RT  great toe pressure = 30 mmHg.   Left: Resting left ankle-brachial index is within normal range. No  evidence of significant left lower extremity arterial disease. The left  toe-brachial index is normal. LT Great toe pressure = 109 mmHg.   Assessment/Plan:   75 year old female with multiple medical comorbidities presents for evaluation of a multitude of symptoms including foot pain in the right lower extremity as well as leg swelling.  In trying to decipher her symptoms, it sounds like the most bothersome issue at this time is pain in the right foot and calf that at times keeps her awake at night.  She does have much more diminished flow in the right leg compared to the left and I cannot palpate any pulses in the right foot compared to an easily palpable dorsalis pedis in the left foot.  Certainly discussed that this could be peripheral arterial disease as underlying etiology.  I have recommended aortogram with lower extremity arteriogram and possible intervention.  Discussed we will address her leg swelling a later time likely with a venous reflux study but will address her arterial disease first.     [name], MD [facility]: [phone]

## 2020-08-14 NOTE — Progress Notes (Signed)
Pt returned to [facility] post procedure w/o IV, per Jan RT/cath lab that Dr [name] stated no IV needed post.

## 2020-08-14 NOTE — Progress Notes (Signed)
Bilateral lower extremity vein mapping completed. Refer to "CV Proc" under chart review to view preliminary results.  08/14/2020 1:52 PM Kelby Aline., MHA, RVT, RDCS, RDMS

## 2020-08-14 NOTE — Op Note (Signed)
    Patient name: Eleen Litz MRN: [id number] DOB: 18-Nov-1945 Sex: female  08/14/2020 Pre-operative Diagnosis: Right foot pain with underlying PAD Post-operative diagnosis:  Same Surgeon:  [name], MD Procedure Performed: 1.  Ultrasound-guided access left common femoral artery 2.  Aortogram including catheter selection of aorta  3.  Right lower extremity arteriogram with selection of second-order branches 4.  Celt closure of the left common femoral artery 5.  19 minutes of monitored moderate conscious sedation time  Indications: Patient is a 75 year old female who was recently seen in the office with swelling as well as pain in the right foot.  After trying to delineate her symptoms, it sounds like the right foot pain was much more bothersome and is keeping her awake at night concerning for rest pain.  She did have ABI of 0.68 on the right monophasic at the ankle.  She presents today for lower extremity arteriogram and possible intervention after risk benefits discussed.  Findings:   Aortogram showed patent renal arteries bilaterally with no flow-limiting stenosis in the aortoiliac segment.  It should be noted that her infrarenal aorta and iliacs are small.  Right lower extremity arteriogram showed a patent common femoral and profunda with a patent diseased and very small SFA proximally.  The mid SFA is occluded.  She does reconstituted above-knee popliteal artery and has a patent below-knee popliteal artery with two-vessel runoff in the anterior tibial and peroneal.  PT appears occluded proximally but does reconstitute in the mid calf.   Procedure:  The patient was identified in the holding area and taken to room 8.  The patient was then placed supine on the table and prepped and draped in the usual sterile fashion.  A time out was called.  Ultrasound was used to evaluate the left common femoral artery.  It was patent .  A digital ultrasound image was acquired.  A micropuncture  needle was used to access the left common femoral artery under ultrasound guidance.  An 018 wire was advanced without resistance and a micropuncture sheath was placed.  The 018 wire was removed and a benson wire was placed.  The micropuncture sheath was exchanged for a 5 french sheath.  An omniflush catheter was advanced over the wire to the level of L-1.  An abdominal angiogram was obtained.  Next, using the omniflush catheter and a benson wire, the aortic bifurcation was crossed and the catheter was placed into theright external iliac artery and right runoff was obtained.  After evaluated pictures, she will need bypass in right leg.  Celt closure was deployed in the left common femoral artery under US guidance.  Plan: Patient will be scheduled for right common femoral to popliteal bypass.  I do not feel endovascular invention with balloon angioplasty and stenting would be durable given the very small size of her SFA.   [name], MD [facility] Office: [phone]

## 2020-08-15 ENCOUNTER — Other Ambulatory Visit: Payer: Self-pay | Admitting: *Deleted

## 2020-08-15 ENCOUNTER — Encounter: Payer: Self-pay | Admitting: *Deleted

## 2020-08-15 ENCOUNTER — Encounter (HOSPITAL_COMMUNITY): Payer: Self-pay | Admitting: Vascular Surgery

## 2020-08-27 NOTE — Pre-Procedure Instructions (Signed)
Surgical Instructions:    Your procedure is scheduled on Wednesday, July 6th (11:47 AM- 3:27 PM).  Report to [facility] Main Entrance "A" at 09:45 A.M., then check in with the Admitting office.  Call this number if you have any questions prior to your surgery date, or have problems the morning of surgery:  [phone]    Remember:  Do not eat or drink after midnight the night before your surgery.     Take these medicines the morning of surgery with A SIP OF WATER:   busPIRone (BUSPAR) gabapentin (NEURONTIN) levETIRAcetam (KEPPRA)   IF NEEDED: HYDROcodone-acetaminophen (NORCO)  loratadine (CLARITIN)  tetrahydrozoline-zinc (VISINE-AC) eye drops albuterol (PROVENTIL HFA;VENTOLIN HFA) inhaler- Please bring with you the day of surgery.    As of today, STOP taking any Aspirin (unless otherwise instructed by your surgeon), NSAIDs such as diclofenac Sodium (VOLTAREN) GEL, Aleve, Naproxen, Ibuprofen, Motrin, Advil, Goody's, BC's, all herbal medications, fish oil, and all vitamins.             Special instructions:    Heather Horton- Preparing For Surgery  Before surgery, you can play an important role. Because skin is not sterile, your skin needs to be as free of germs as possible. You can reduce the number of germs on your skin by washing with CHG (chlorahexidine gluconate) Soap before surgery.  CHG is an antiseptic cleaner which kills germs and bonds with the skin to continue killing germs even after washing.     Please do not use if you have an allergy to CHG or antibacterial soaps. If your skin becomes reddened/irritated stop using the CHG.  Do not shave (including legs and underarms) for at least 48 hours prior to first CHG shower. It is OK to shave your face.  Please follow these instructions carefully.     Shower the NIGHT BEFORE SURGERY and the MORNING OF SURGERY with CHG Soap.   If you chose to wash your hair, wash your hair first as usual with your normal shampoo. After  you shampoo, rinse your hair and body thoroughly to remove the shampoo.  Then [employer] and genitals (private parts) with your normal soap and rinse thoroughly to remove soap.  After that Use CHG Soap as you would any other liquid soap. You can apply CHG directly to the skin and wash gently with a scrungie or a clean washcloth.   Apply the CHG Soap to your body ONLY FROM THE NECK DOWN.  Do not use on open wounds or open sores. Avoid contact with your eyes, ears, mouth and genitals (private parts). Wash Face and genitals (private parts)  with your normal soap.   Wash thoroughly, paying special attention to the area where your surgery will be performed.  Thoroughly rinse your body with warm water from the neck down.  DO NOT shower/wash with your normal soap after using and rinsing off the CHG Soap.  Pat yourself dry with a CLEAN TOWEL.  Wear CLEAN PAJAMAS to bed the night before surgery  Place CLEAN SHEETS on your bed the night before your surgery  DO NOT SLEEP WITH PETS.   Day of Surgery:  Take a shower with CHG soap. Wear Clean/Comfortable clothing the morning of surgery Do not wear lotions, powders, perfumes, or deodorant.   Remember to brush your teeth WITH YOUR REGULAR TOOTHPASTE. Do not wear jewelry or makeup. DO Not wear nail polish, gel polish, artificial nails, or any other type of covering on natural nails including finger and toenails.  If patients have artificial nails, gel coating, etc. that need to be removed by a nail salon please have this removed prior to surgery or surgery may need to be canceled/delayed if the surgeon/ anesthesia feels like the patient is unable to be adequately monitored. Do not shave 48 hours prior to surgery.   Do not bring valuables to the hospital. [facility] is not responsible for any belongings or valuables.  Do NOT Smoke (Tobacco/Vaping) or drink Alcohol 24 hours prior to your procedure.  If you use a CPAP at night, you may bring all  equipment for your overnight stay.   Contacts, glasses, dentures or bridgework may not be worn into surgery, please bring cases for these belongings.   For patients admitted to the hospital, discharge time will be determined by your treatment team.  Patients discharged the day of surgery will not be allowed to drive home, and someone needs to stay with them for 24 hours.  ONLY 1 SUPPORT PERSON MAY BE PRESENT WHILE YOU ARE IN SURGERY. IF YOU ARE TO BE ADMITTED ONCE YOU ARE IN YOUR ROOM YOU WILL BE ALLOWED TWO (2) VISITORS.  Minor children may have two parents present. Special consideration for safety and communication needs will be reviewed on a case by case basis.     Please read over the following fact sheets that you were given.

## 2020-08-28 ENCOUNTER — Encounter (HOSPITAL_COMMUNITY)
Admission: RE | Admit: 2020-08-28 | Discharge: 2020-08-28 | Disposition: A | Discharge status: Home / Self Care | Payer: Medicare HMO | Source: Ambulatory Visit | Attending: Vascular Surgery | Admitting: Vascular Surgery

## 2020-08-28 ENCOUNTER — Other Ambulatory Visit: Payer: Self-pay

## 2020-08-28 ENCOUNTER — Encounter (HOSPITAL_COMMUNITY): Payer: Self-pay

## 2020-08-28 DIAGNOSIS — Z01812 Encounter for preprocedural laboratory examination: Secondary | ICD-10-CM | POA: Insufficient documentation

## 2020-08-28 HISTORY — DX: Headache, unspecified: R51.9

## 2020-08-28 HISTORY — DX: Family history of other specified conditions: Z84.89

## 2020-08-28 HISTORY — DX: Chronic kidney disease, unspecified: N18.9

## 2020-08-28 HISTORY — DX: Myoneural disorder, unspecified: G70.9

## 2020-08-28 HISTORY — DX: Fibromyalgia: M79.7

## 2020-08-28 LAB — COMPREHENSIVE METABOLIC PANEL
ALT: 21 U/L (ref 0–44)
AST: 22 U/L (ref 15–41)
Albumin: 4.1 g/dL (ref 3.5–5.0)
Alkaline Phosphatase: 66 U/L (ref 38–126)
Anion gap: 12 (ref 5–15)
BUN: 14 mg/dL (ref 8–23)
CO2: 21 mmol/L — ABNORMAL LOW (ref 22–32)
Calcium: 9.6 mg/dL (ref 8.9–10.3)
Chloride: 101 mmol/L (ref 98–111)
Creatinine, Ser: 0.66 mg/dL (ref 0.44–1.00)
GFR, Estimated: >60 mL/min (ref >60)
Glucose, Bld: 99 mg/dL (ref 70–99)
Potassium: 3.4 mmol/L — ABNORMAL LOW (ref 3.5–5.1)
Sodium: 134 mmol/L — ABNORMAL LOW (ref 135–145)
Total Bilirubin: 0.7 mg/dL (ref 0.3–1.2)
Total Protein: 7.5 g/dL (ref 6.5–8.1)

## 2020-08-28 LAB — SURGICAL PCR SCREEN
MRSA, PCR: NEGATIVE
Staphylococcus aureus: NEGATIVE

## 2020-08-28 LAB — URINALYSIS, ROUTINE W REFLEX MICROSCOPIC
Bilirubin Urine: NEGATIVE
Glucose, UA: NEGATIVE mg/dL
Hgb urine dipstick: NEGATIVE
Ketones, ur: NEGATIVE mg/dL
Leukocytes,Ua: NEGATIVE
Nitrite: NEGATIVE
Protein, ur: NEGATIVE mg/dL
Specific Gravity, Urine: 1.005 (ref 1.005–1.030)
pH: 6 (ref 5.0–8.0)

## 2020-08-28 LAB — TYPE AND SCREEN
ABO/RH(D): AB POS
Antibody Screen: NEGATIVE

## 2020-08-28 LAB — PROTIME-INR
INR: 1 (ref 0.8–1.2)
Prothrombin Time: 13.5 seconds (ref 11.4–15.2)

## 2020-08-28 LAB — CBC
HCT: 37.3 % (ref 36.0–46.0)
Hemoglobin: 12.4 g/dL (ref 12.0–15.0)
MCH: 32.5 pg (ref 26.0–34.0)
MCHC: 33.2 g/dL (ref 30.0–36.0)
MCV: 97.6 fL (ref 80.0–100.0)
Platelets: 312 10*3/uL (ref 150–400)
RBC: 3.82 MIL/uL — ABNORMAL LOW (ref 3.87–5.11)
RDW: 13.9 % (ref 11.5–15.5)
WBC: 8.2 10*3/uL (ref 4.0–10.5)
nRBC: 0 % (ref 0.0–0.2)

## 2020-08-28 LAB — APTT: aPTT: 34 seconds (ref 24–36)

## 2020-08-28 NOTE — Progress Notes (Addendum)
PCP - [name], [place] Cardiologist - denies Neurologist- Ellouise Newer, MD  PPM/ICD - denies  Chest x-ray - 01/27/20 EKG - 01/28/20 Stress Test - pt states >10 years ago. No abnormalities per pt ECHO - denies Cardiac Cath - denies  Sleep Study - denies  DM: denies  ERAS Protcol - No  COVID TEST- scheduled 09/02/20   Anesthesia review: yes, vascular surgery  Patient denies shortness of breath, fever, cough and chest pain at PAT appointment   All instructions explained to the patient, with a verbal understanding of the material. Patient agrees to go over the instructions while at home for a better understanding. Patient also instructed to self quarantine after being tested for COVID-19. The opportunity to ask questions was provided.

## 2020-09-02 ENCOUNTER — Other Ambulatory Visit (HOSPITAL_COMMUNITY)
Admission: RE | Admit: 2020-09-02 | Discharge: 2020-09-02 | Disposition: A | Discharge status: Home / Self Care | Payer: Medicare HMO | Source: Ambulatory Visit | Attending: Vascular Surgery | Admitting: Vascular Surgery

## 2020-09-02 DIAGNOSIS — Z20822 Contact with and (suspected) exposure to covid-19: Secondary | ICD-10-CM | POA: Insufficient documentation

## 2020-09-02 DIAGNOSIS — Z01812 Encounter for preprocedural laboratory examination: Secondary | ICD-10-CM | POA: Insufficient documentation

## 2020-09-03 ENCOUNTER — Inpatient Hospital Stay (HOSPITAL_COMMUNITY): Payer: Medicare HMO | Admitting: Physician Assistant

## 2020-09-03 ENCOUNTER — Inpatient Hospital Stay (HOSPITAL_COMMUNITY)
Admission: RE | Admit: 2020-09-03 | Discharge: 2020-09-07 | DRG: 254 | Disposition: A | Discharge status: Home / Self Care | Payer: Medicare HMO | Attending: Vascular Surgery | Admitting: Vascular Surgery

## 2020-09-03 ENCOUNTER — Other Ambulatory Visit: Payer: Self-pay

## 2020-09-03 ENCOUNTER — Inpatient Hospital Stay (HOSPITAL_COMMUNITY): Payer: Medicare HMO | Admitting: Anesthesiology

## 2020-09-03 ENCOUNTER — Encounter (HOSPITAL_COMMUNITY): Payer: Self-pay | Admitting: Vascular Surgery

## 2020-09-03 ENCOUNTER — Encounter (HOSPITAL_COMMUNITY)
Admission: RE | Disposition: A | Discharge status: Home / Self Care | Payer: Self-pay | Source: Home / Self Care | Attending: Vascular Surgery

## 2020-09-03 DIAGNOSIS — I10 Essential (primary) hypertension: Secondary | ICD-10-CM | POA: Diagnosis present

## 2020-09-03 DIAGNOSIS — Z823 Family history of stroke: Secondary | ICD-10-CM

## 2020-09-03 DIAGNOSIS — Z90710 Acquired absence of both cervix and uterus: Secondary | ICD-10-CM | POA: Diagnosis not present

## 2020-09-03 DIAGNOSIS — I739 Peripheral vascular disease, unspecified: Secondary | ICD-10-CM | POA: Diagnosis present

## 2020-09-03 DIAGNOSIS — Z800 Family history of malignant neoplasm of digestive organs: Secondary | ICD-10-CM | POA: Diagnosis not present

## 2020-09-03 DIAGNOSIS — I70221 Atherosclerosis of native arteries of extremities with rest pain, right leg: Secondary | ICD-10-CM

## 2020-09-03 DIAGNOSIS — Z79899 Other long term (current) drug therapy: Secondary | ICD-10-CM

## 2020-09-03 DIAGNOSIS — Z886 Allergy status to analgesic agent status: Secondary | ICD-10-CM

## 2020-09-03 DIAGNOSIS — Z8541 Personal history of malignant neoplasm of cervix uteri: Secondary | ICD-10-CM | POA: Diagnosis not present

## 2020-09-03 DIAGNOSIS — Z801 Family history of malignant neoplasm of trachea, bronchus and lung: Secondary | ICD-10-CM

## 2020-09-03 DIAGNOSIS — F17210 Nicotine dependence, cigarettes, uncomplicated: Secondary | ICD-10-CM | POA: Diagnosis present

## 2020-09-03 DIAGNOSIS — Z87448 Personal history of other diseases of urinary system: Secondary | ICD-10-CM | POA: Diagnosis not present

## 2020-09-03 DIAGNOSIS — Z20822 Contact with and (suspected) exposure to covid-19: Secondary | ICD-10-CM | POA: Diagnosis present

## 2020-09-03 DIAGNOSIS — K219 Gastro-esophageal reflux disease without esophagitis: Secondary | ICD-10-CM | POA: Diagnosis present

## 2020-09-03 DIAGNOSIS — I70211 Atherosclerosis of native arteries of extremities with intermittent claudication, right leg: Principal | ICD-10-CM | POA: Diagnosis present

## 2020-09-03 DIAGNOSIS — Z8249 Family history of ischemic heart disease and other diseases of the circulatory system: Secondary | ICD-10-CM

## 2020-09-03 DIAGNOSIS — E785 Hyperlipidemia, unspecified: Secondary | ICD-10-CM | POA: Diagnosis present

## 2020-09-03 DIAGNOSIS — Z825 Family history of asthma and other chronic lower respiratory diseases: Secondary | ICD-10-CM | POA: Diagnosis not present

## 2020-09-03 HISTORY — PX: FEMORAL-POPLITEAL BYPASS GRAFT: SHX937

## 2020-09-03 LAB — CBC
HCT: 35 % — ABNORMAL LOW (ref 36.0–46.0)
Hemoglobin: 11.7 g/dL — ABNORMAL LOW (ref 12.0–15.0)
MCH: 32.9 pg (ref 26.0–34.0)
MCHC: 33.4 g/dL (ref 30.0–36.0)
MCV: 98.3 fL (ref 80.0–100.0)
Platelets: 302 10*3/uL (ref 150–400)
RBC: 3.56 MIL/uL — ABNORMAL LOW (ref 3.87–5.11)
RDW: 14.1 % (ref 11.5–15.5)
WBC: 9.9 10*3/uL (ref 4.0–10.5)
nRBC: 0 % (ref 0.0–0.2)

## 2020-09-03 LAB — SARS CORONAVIRUS 2 (TAT 6-24 HRS): SARS Coronavirus 2: NEGATIVE

## 2020-09-03 LAB — ABO/RH: ABO/RH(D): AB POS

## 2020-09-03 LAB — CREATININE, SERUM
Creatinine, Ser: 0.73 mg/dL (ref 0.44–1.00)
GFR, Estimated: >60 mL/min (ref >60)

## 2020-09-03 SURGERY — BYPASS GRAFT FEMORAL-POPLITEAL ARTERY
Anesthesia: General | Laterality: Right

## 2020-09-03 MED ORDER — CEFAZOLIN SODIUM-DEXTROSE 2-4 GM/100ML-% IV SOLN
2.0000 g | Freq: Three times a day (TID) | INTRAVENOUS | Status: AC
  Administered 2020-09-03 – 2020-09-04 (×2): 2 g via INTRAVENOUS
  Filled 2020-09-03 (×2): qty 100

## 2020-09-03 MED ORDER — METOPROLOL TARTRATE 5 MG/5ML IV SOLN: 2.0000 mg | INTRAVENOUS | Status: DC | PRN

## 2020-09-03 MED ORDER — ACETAMINOPHEN 650 MG RE SUPP: 325.0000 mg | RECTAL | Status: DC | PRN

## 2020-09-03 MED ORDER — ONDANSETRON HCL 4 MG/2ML IJ SOLN: 4.0000 mg | Freq: Four times a day (QID) | INTRAMUSCULAR | Status: DC | PRN

## 2020-09-03 MED ORDER — HYDROCHLOROTHIAZIDE 25 MG PO TABS
25.0000 mg | ORAL_TABLET | Freq: Every morning | ORAL | Status: DC
  Administered 2020-09-06 – 2020-09-07 (×2): 25 mg via ORAL
  Filled 2020-09-03 (×2): qty 1

## 2020-09-03 MED ORDER — CHLORHEXIDINE GLUCONATE 0.12 % MT SOLN
OROMUCOSAL | Status: AC
  Administered 2020-09-03: 15 mL via OROMUCOSAL
  Filled 2020-09-03: qty 15

## 2020-09-03 MED ORDER — HEPARIN SODIUM (PORCINE) 1000 UNIT/ML IJ SOLN
INTRAMUSCULAR | Status: DC | PRN
  Administered 2020-09-03: 9000 [IU] via INTRAVENOUS

## 2020-09-03 MED ORDER — VITAMIN D (ERGOCALCIFEROL) 1.25 MG (50000 UNIT) PO CAPS: 50000.0000 [IU] | ORAL_CAPSULE | ORAL | Status: DC

## 2020-09-03 MED ORDER — KETOCONAZOLE 2 % EX CREA: 1.0000 "application " | TOPICAL_CREAM | Freq: Every day | CUTANEOUS | Status: DC | PRN

## 2020-09-03 MED ORDER — BUSPIRONE HCL 5 MG PO TABS
5.0000 mg | ORAL_TABLET | Freq: Three times a day (TID) | ORAL | Status: DC
  Administered 2020-09-03 – 2020-09-07 (×12): 5 mg via ORAL
  Filled 2020-09-03 (×12): qty 1

## 2020-09-03 MED ORDER — MIDAZOLAM HCL 5 MG/5ML IJ SOLN
INTRAMUSCULAR | Status: DC | PRN
  Administered 2020-09-03: 2 mg via INTRAVENOUS

## 2020-09-03 MED ORDER — LEVETIRACETAM 500 MG PO TABS
500.0000 mg | ORAL_TABLET | Freq: Two times a day (BID) | ORAL | Status: DC
  Administered 2020-09-03 – 2020-09-07 (×8): 500 mg via ORAL
  Filled 2020-09-03 (×8): qty 1

## 2020-09-03 MED ORDER — ACETAMINOPHEN 500 MG PO TABS: 1000.0000 mg | ORAL_TABLET | Freq: Once | ORAL | Status: DC

## 2020-09-03 MED ORDER — ROCURONIUM BROMIDE 10 MG/ML (PF) SYRINGE
PREFILLED_SYRINGE | INTRAVENOUS | Status: AC
  Filled 2020-09-03: qty 10

## 2020-09-03 MED ORDER — PROTAMINE SULFATE 10 MG/ML IV SOLN
INTRAVENOUS | Status: DC | PRN
  Administered 2020-09-03: 40 mg via INTRAVENOUS

## 2020-09-03 MED ORDER — ALBUTEROL SULFATE (2.5 MG/3ML) 0.083% IN NEBU: 3.0000 mL | INHALATION_SOLUTION | Freq: Four times a day (QID) | RESPIRATORY_TRACT | Status: DC | PRN

## 2020-09-03 MED ORDER — GABAPENTIN 300 MG PO CAPS
300.0000 mg | ORAL_CAPSULE | Freq: Two times a day (BID) | ORAL | Status: DC
  Administered 2020-09-04 – 2020-09-07 (×7): 300 mg via ORAL
  Filled 2020-09-03 (×7): qty 1

## 2020-09-03 MED ORDER — ATORVASTATIN CALCIUM 10 MG PO TABS
10.0000 mg | ORAL_TABLET | Freq: Every day | ORAL | Status: DC
  Administered 2020-09-04 – 2020-09-05 (×2): 10 mg via ORAL
  Filled 2020-09-03 (×2): qty 1

## 2020-09-03 MED ORDER — CHLORHEXIDINE GLUCONATE CLOTH 2 % EX PADS
6.0000 | MEDICATED_PAD | Freq: Once | CUTANEOUS | Status: DC
  Administered 2020-09-03: 6 via TOPICAL

## 2020-09-03 MED ORDER — AMISULPRIDE (ANTIEMETIC) 5 MG/2ML IV SOLN: 10.0000 mg | Freq: Once | INTRAVENOUS | Status: DC | PRN

## 2020-09-03 MED ORDER — PHENYLEPHRINE HCL-NACL 10-0.9 MG/250ML-% IV SOLN
INTRAVENOUS | Status: DC | PRN
  Administered 2020-09-03: 40 ug/min via INTRAVENOUS

## 2020-09-03 MED ORDER — BISACODYL 5 MG PO TBEC: 5.0000 mg | DELAYED_RELEASE_TABLET | Freq: Every day | ORAL | Status: DC | PRN

## 2020-09-03 MED ORDER — LORATADINE 10 MG PO TABS: 10.0000 mg | ORAL_TABLET | Freq: Every day | ORAL | Status: DC | PRN

## 2020-09-03 MED ORDER — PHENYLEPHRINE HCL (PRESSORS) 10 MG/ML IV SOLN
INTRAVENOUS | Status: DC | PRN
  Administered 2020-09-03 (×4): 100 ug via INTRAVENOUS

## 2020-09-03 MED ORDER — PANTOPRAZOLE SODIUM 40 MG PO TBEC
40.0000 mg | DELAYED_RELEASE_TABLET | Freq: Every day | ORAL | Status: DC
  Administered 2020-09-03 – 2020-09-06 (×4): 40 mg via ORAL
  Filled 2020-09-03 (×4): qty 1

## 2020-09-03 MED ORDER — LIDOCAINE 2% (20 MG/ML) 5 ML SYRINGE
INTRAMUSCULAR | Status: AC
  Filled 2020-09-03: qty 5

## 2020-09-03 MED ORDER — LABETALOL HCL 5 MG/ML IV SOLN: 10.0000 mg | INTRAVENOUS | Status: DC | PRN

## 2020-09-03 MED ORDER — DOCUSATE SODIUM 100 MG PO CAPS
100.0000 mg | ORAL_CAPSULE | Freq: Every day | ORAL | Status: DC
  Administered 2020-09-05 – 2020-09-07 (×3): 100 mg via ORAL
  Filled 2020-09-03 (×4): qty 1

## 2020-09-03 MED ORDER — PANTOPRAZOLE SODIUM 40 MG PO TBEC: 40.0000 mg | DELAYED_RELEASE_TABLET | Freq: Every day | ORAL | Status: DC

## 2020-09-03 MED ORDER — HYDROMORPHONE HCL 1 MG/ML IJ SOLN
0.5000 mg | INTRAMUSCULAR | Status: DC | PRN
  Administered 2020-09-03 – 2020-09-04 (×5): 1 mg via INTRAVENOUS
  Filled 2020-09-03 (×5): qty 1

## 2020-09-03 MED ORDER — FENTANYL CITRATE (PF) 250 MCG/5ML IJ SOLN
INTRAMUSCULAR | Status: AC
  Filled 2020-09-03: qty 5

## 2020-09-03 MED ORDER — HEPARIN 6000 UNIT IRRIGATION SOLUTION
Status: DC | PRN
  Administered 2020-09-03: 1

## 2020-09-03 MED ORDER — PHENYLEPHRINE 40 MCG/ML (10ML) SYRINGE FOR IV PUSH (FOR BLOOD PRESSURE SUPPORT)
PREFILLED_SYRINGE | INTRAVENOUS | Status: AC
  Filled 2020-09-03: qty 10

## 2020-09-03 MED ORDER — SENNOSIDES-DOCUSATE SODIUM 8.6-50 MG PO TABS: 1.0000 | ORAL_TABLET | Freq: Every evening | ORAL | Status: DC | PRN

## 2020-09-03 MED ORDER — FENTANYL CITRATE (PF) 100 MCG/2ML IJ SOLN
INTRAMUSCULAR | Status: AC
  Filled 2020-09-03: qty 2

## 2020-09-03 MED ORDER — FOLIC ACID 1 MG PO TABS
1.0000 mg | ORAL_TABLET | Freq: Every morning | ORAL | Status: DC
  Administered 2020-09-05 – 2020-09-07 (×3): 1 mg via ORAL
  Filled 2020-09-03 (×4): qty 1

## 2020-09-03 MED ORDER — GUAIFENESIN-DM 100-10 MG/5ML PO SYRP: 15.0000 mL | ORAL_SOLUTION | ORAL | Status: DC | PRN

## 2020-09-03 MED ORDER — HYDRALAZINE HCL 20 MG/ML IJ SOLN: 5.0000 mg | INTRAMUSCULAR | Status: DC | PRN

## 2020-09-03 MED ORDER — CEFAZOLIN SODIUM-DEXTROSE 2-4 GM/100ML-% IV SOLN
INTRAVENOUS | Status: AC
  Filled 2020-09-03: qty 100

## 2020-09-03 MED ORDER — FENTANYL CITRATE (PF) 100 MCG/2ML IJ SOLN
25.0000 ug | INTRAMUSCULAR | Status: DC | PRN
  Administered 2020-09-03: 50 ug via INTRAVENOUS

## 2020-09-03 MED ORDER — PHENOL 1.4 % MT LIQD: 1.0000 | OROMUCOSAL | Status: DC | PRN

## 2020-09-03 MED ORDER — MIDAZOLAM HCL 2 MG/2ML IJ SOLN
INTRAMUSCULAR | Status: AC
  Filled 2020-09-03: qty 2

## 2020-09-03 MED ORDER — FENTANYL CITRATE (PF) 250 MCG/5ML IJ SOLN
INTRAMUSCULAR | Status: DC | PRN
  Administered 2020-09-03: 50 ug via INTRAVENOUS
  Administered 2020-09-03 (×2): 100 ug via INTRAVENOUS
  Administered 2020-09-03: 50 ug via INTRAVENOUS
  Administered 2020-09-03: 100 ug via INTRAVENOUS
  Administered 2020-09-03 (×2): 50 ug via INTRAVENOUS

## 2020-09-03 MED ORDER — GABAPENTIN 300 MG PO CAPS
600.0000 mg | ORAL_CAPSULE | Freq: Every day | ORAL | Status: DC
  Administered 2020-09-03 – 2020-09-06 (×4): 600 mg via ORAL
  Filled 2020-09-03 (×4): qty 2

## 2020-09-03 MED ORDER — CHLORHEXIDINE GLUCONATE 0.12 % MT SOLN: 15.0000 mL | Freq: Once | OROMUCOSAL | Status: AC

## 2020-09-03 MED ORDER — HEPARIN SODIUM (PORCINE) 5000 UNIT/ML IJ SOLN
5000.0000 [IU] | Freq: Three times a day (TID) | INTRAMUSCULAR | Status: DC
  Administered 2020-09-04 – 2020-09-06 (×8): 5000 [IU] via SUBCUTANEOUS
  Filled 2020-09-03 (×8): qty 1

## 2020-09-03 MED ORDER — PROPOFOL 10 MG/ML IV BOLUS
INTRAVENOUS | Status: DC | PRN
  Administered 2020-09-03: 30 mg via INTRAVENOUS
  Administered 2020-09-03: 170 mg via INTRAVENOUS

## 2020-09-03 MED ORDER — HEPARIN 6000 UNIT IRRIGATION SOLUTION
Status: AC
  Filled 2020-09-03: qty 500

## 2020-09-03 MED ORDER — POTASSIUM CHLORIDE CRYS ER 20 MEQ PO TBCR: 20.0000 meq | EXTENDED_RELEASE_TABLET | Freq: Every day | ORAL | Status: DC | PRN

## 2020-09-03 MED ORDER — ALUM & MAG HYDROXIDE-SIMETH 200-200-20 MG/5ML PO SUSP: 15.0000 mL | ORAL | Status: DC | PRN

## 2020-09-03 MED ORDER — SODIUM CHLORIDE 0.9 % IV SOLN: INTRAVENOUS | Status: DC

## 2020-09-03 MED ORDER — ONDANSETRON HCL 4 MG/2ML IJ SOLN
INTRAMUSCULAR | Status: DC | PRN
  Administered 2020-09-03: 4 mg via INTRAVENOUS

## 2020-09-03 MED ORDER — 0.9 % SODIUM CHLORIDE (POUR BTL) OPTIME
TOPICAL | Status: DC | PRN
  Administered 2020-09-03: 1000 mL

## 2020-09-03 MED ORDER — ORAL CARE MOUTH RINSE: 15.0000 mL | Freq: Once | OROMUCOSAL | Status: AC

## 2020-09-03 MED ORDER — MAGNESIUM SULFATE 2 GM/50ML IV SOLN: 2.0000 g | Freq: Every day | INTRAVENOUS | Status: DC | PRN

## 2020-09-03 MED ORDER — PROPOFOL 10 MG/ML IV BOLUS
INTRAVENOUS | Status: AC
  Filled 2020-09-03: qty 20

## 2020-09-03 MED ORDER — SUGAMMADEX SODIUM 500 MG/5ML IV SOLN
INTRAVENOUS | Status: DC | PRN
  Administered 2020-09-03: 200 mg via INTRAVENOUS

## 2020-09-03 MED ORDER — LACTATED RINGERS IV SOLN: INTRAVENOUS | Status: DC

## 2020-09-03 MED ORDER — ACETAMINOPHEN 325 MG PO TABS
325.0000 mg | ORAL_TABLET | ORAL | Status: DC | PRN
  Administered 2020-09-04: 650 mg via ORAL
  Filled 2020-09-03: qty 2

## 2020-09-03 MED ORDER — LIDOCAINE HCL (CARDIAC) PF 100 MG/5ML IV SOSY
PREFILLED_SYRINGE | INTRAVENOUS | Status: DC | PRN
  Administered 2020-09-03: 60 mg via INTRAVENOUS

## 2020-09-03 MED ORDER — CEFAZOLIN SODIUM-DEXTROSE 2-4 GM/100ML-% IV SOLN
2.0000 g | INTRAVENOUS | Status: AC
  Administered 2020-09-03: 2 g via INTRAVENOUS

## 2020-09-03 MED ORDER — ONDANSETRON HCL 4 MG/2ML IJ SOLN
INTRAMUSCULAR | Status: AC
  Filled 2020-09-03: qty 2

## 2020-09-03 MED ORDER — SODIUM CHLORIDE 0.9 % IV SOLN: 500.0000 mL | Freq: Once | INTRAVENOUS | Status: DC | PRN

## 2020-09-03 MED ORDER — ROCURONIUM BROMIDE 100 MG/10ML IV SOLN
INTRAVENOUS | Status: DC | PRN
  Administered 2020-09-03: 20 mg via INTRAVENOUS
  Administered 2020-09-03: 40 mg via INTRAVENOUS
  Administered 2020-09-03: 20 mg via INTRAVENOUS

## 2020-09-03 MED ORDER — HYDROCODONE-ACETAMINOPHEN 5-325 MG PO TABS
1.0000 | ORAL_TABLET | ORAL | Status: DC | PRN
  Administered 2020-09-03: 2 via ORAL
  Administered 2020-09-04: 1 via ORAL
  Administered 2020-09-04 – 2020-09-07 (×11): 2 via ORAL
  Filled 2020-09-03 (×8): qty 2
  Filled 2020-09-03: qty 1
  Filled 2020-09-03 (×4): qty 2

## 2020-09-03 MED ORDER — QUETIAPINE FUMARATE 100 MG PO TABS
300.0000 mg | ORAL_TABLET | Freq: Every day | ORAL | Status: DC
  Administered 2020-09-03 – 2020-09-06 (×4): 300 mg via ORAL
  Filled 2020-09-03 (×4): qty 3

## 2020-09-03 SURGICAL SUPPLY — 63 items
BAG COUNTER SPONGE SURGICOUNT (BAG) ×2 IMPLANT
BANDAGE ESMARK 6X9 LF (GAUZE/BANDAGES/DRESSINGS) IMPLANT
BLADE CLIPPER SURG (BLADE) ×2 IMPLANT
BNDG ESMARK 6X9 LF (GAUZE/BANDAGES/DRESSINGS)
CANISTER SUCT 3000ML PPV (MISCELLANEOUS) ×2 IMPLANT
CANNULA VESSEL 3MM 2 BLNT TIP (CANNULA) ×2 IMPLANT
CLIP FOGARTY SPRING 6M (CLIP) IMPLANT
CLIP VESOCCLUDE MED 24/CT (CLIP) ×2 IMPLANT
CLIP VESOCCLUDE SM WIDE 24/CT (CLIP) ×2 IMPLANT
COVER LIGHT HANDLE  1/PK (MISCELLANEOUS) ×1
COVER LIGHT HANDLE 1/PK (MISCELLANEOUS) ×1 IMPLANT
COVER PROBE W GEL 5X96 (DRAPES) ×2 IMPLANT
CUFF TOURN SGL QUICK 24 (TOURNIQUET CUFF)
CUFF TOURN SGL QUICK 34 (TOURNIQUET CUFF)
CUFF TOURN SGL QUICK 42 (TOURNIQUET CUFF) IMPLANT
CUFF TRNQT CYL 24X4X16.5-23 (TOURNIQUET CUFF) IMPLANT
CUFF TRNQT CYL 34X4.125X (TOURNIQUET CUFF) IMPLANT
DERMABOND ADVANCED (GAUZE/BANDAGES/DRESSINGS) ×1
DERMABOND ADVANCED .7 DNX12 (GAUZE/BANDAGES/DRESSINGS) ×1 IMPLANT
DRAIN CHANNEL 15F RND FF W/TCR (WOUND CARE) IMPLANT
DRAPE C-ARM 42X72 X-RAY (DRAPES) IMPLANT
DRAPE HALF SHEET 40X57 (DRAPES) IMPLANT
DRAPE IMP U-DRAPE 54X76 (DRAPES) ×2 IMPLANT
ELECT REM PT RETURN 9FT ADLT (ELECTROSURGICAL) ×2
ELECTRODE REM PT RTRN 9FT ADLT (ELECTROSURGICAL) ×1 IMPLANT
EVACUATOR SILICONE 100CC (DRAIN) IMPLANT
GLOVE SRG 8 PF TXTR STRL LF DI (GLOVE) ×1 IMPLANT
GLOVE SURG ENC MOIS LTX SZ7.5 (GLOVE) ×2 IMPLANT
GLOVE SURG UNDER POLY LF SZ8 (GLOVE) ×1
GOWN STRL REUS W/ TWL LRG LVL3 (GOWN DISPOSABLE) ×2 IMPLANT
GOWN STRL REUS W/ TWL XL LVL3 (GOWN DISPOSABLE) ×2 IMPLANT
GOWN STRL REUS W/TWL LRG LVL3 (GOWN DISPOSABLE) ×2
GOWN STRL REUS W/TWL XL LVL3 (GOWN DISPOSABLE) ×2
HEMOSTAT SPONGE AVITENE ULTRA (HEMOSTASIS) IMPLANT
INSERT FOGARTY SM (MISCELLANEOUS) IMPLANT
KIT BASIN OR (CUSTOM PROCEDURE TRAY) ×2 IMPLANT
KIT TURNOVER KIT B (KITS) ×2 IMPLANT
NS IRRIG 1000ML POUR BTL (IV SOLUTION) ×4 IMPLANT
PACK PERIPHERAL VASCULAR (CUSTOM PROCEDURE TRAY) ×2 IMPLANT
PAD ARMBOARD 7.5X6 YLW CONV (MISCELLANEOUS) ×4 IMPLANT
SET MICROPUNCTURE 5F STIFF (MISCELLANEOUS) IMPLANT
STOPCOCK 4 WAY LG BORE MALE ST (IV SETS) IMPLANT
SUT ETHILON 3 0 PS 1 (SUTURE) ×4 IMPLANT
SUT GORETEX 5 0 TT13 24 (SUTURE) IMPLANT
SUT GORETEX 6.0 TT13 (SUTURE) IMPLANT
SUT MNCRL AB 4-0 PS2 18 (SUTURE) ×8 IMPLANT
SUT PROLENE 5 0 C 1 24 (SUTURE) ×4 IMPLANT
SUT PROLENE 6 0 BV (SUTURE) ×8 IMPLANT
SUT PROLENE 6 0 C 1 24 (SUTURE) ×2 IMPLANT
SUT PROLENE 7 0 BV 1 (SUTURE) IMPLANT
SUT SILK 2 0 PERMA HAND 18 BK (SUTURE) ×2 IMPLANT
SUT SILK 3 0 (SUTURE) ×3
SUT SILK 3-0 18XBRD TIE 12 (SUTURE) ×3 IMPLANT
SUT VIC AB 2-0 CT1 27 (SUTURE) ×2
SUT VIC AB 2-0 CT1 TAPERPNT 27 (SUTURE) ×2 IMPLANT
SUT VIC AB 3-0 SH 27 (SUTURE) ×4
SUT VIC AB 3-0 SH 27X BRD (SUTURE) ×4 IMPLANT
TAPE UMBILICAL COTTON 1/8X30 (MISCELLANEOUS) IMPLANT
TOWEL GREEN STERILE (TOWEL DISPOSABLE) ×2 IMPLANT
TRAY FOLEY MTR SLVR 16FR STAT (SET/KITS/TRAYS/PACK) ×2 IMPLANT
TUBING EXTENTION W/L.L. (IV SETS) IMPLANT
UNDERPAD 30X36 HEAVY ABSORB (UNDERPADS AND DIAPERS) ×2 IMPLANT
WATER STERILE IRR 1000ML POUR (IV SOLUTION) ×2 IMPLANT

## 2020-09-03 NOTE — Anesthesia Preprocedure Evaluation (Signed)
Anesthesia Evaluation  Patient identified by MRN, date of birth, ID band Patient awake    Reviewed: Allergy & Precautions, NPO status , Patient's Chart, lab work & pertinent test results  Airway Mallampati: II  TM Distance: >3 FB Neck ROM: Full    Dental  (+) Dental Advisory Given   Pulmonary asthma , Current Smoker,    breath sounds clear to auscultation       Cardiovascular hypertension, Pt. on medications + Peripheral Vascular Disease   Rhythm:Regular Rate:Normal     Neuro/Psych Seizures -,  Anxiety  Neuromuscular disease    GI/Hepatic GERD  ,(+) Hepatitis -, C  Endo/Other  negative endocrine ROS  Renal/GU Renal disease     Musculoskeletal  (+) Arthritis , Fibromyalgia -  Abdominal   Peds  Hematology negative hematology ROS (+)   Anesthesia Other Findings   Reproductive/Obstetrics                             Lab Results  Component Value Date   WBC 8.2 08/28/2020   HGB 12.4 08/28/2020   HCT 37.3 08/28/2020   MCV 97.6 08/28/2020   PLT 312 08/28/2020   Lab Results  Component Value Date   CREATININE 0.66 08/28/2020   BUN 14 08/28/2020   NA 134 (L) 08/28/2020   K 3.4 (L) 08/28/2020   CL 101 08/28/2020   CO2 21 (L) 08/28/2020    Anesthesia Physical Anesthesia Plan  ASA: 3  Anesthesia Plan: General   Post-op Pain Management:    Induction: Intravenous  PONV Risk Score and Plan: 2 and Dexamethasone, Ondansetron and Treatment may vary due to age or medical condition  Airway Management Planned: Oral ETT  Additional Equipment: Arterial line  Intra-op Plan:   Post-operative Plan: Extubation in OR  Informed Consent: I have reviewed the patients History and Physical, chart, labs and discussed the procedure including the risks, benefits and alternatives for the proposed anesthesia with the patient or authorized representative who has indicated his/her understanding and  acceptance.     Dental advisory given  Plan Discussed with: CRNA  Anesthesia Plan Comments:         Anesthesia Quick Evaluation

## 2020-09-03 NOTE — Progress Notes (Signed)
Pt arrived from PACU. VSS, CHG complete, Call light within reach, incisions clean dry and intact no hematomas, will continue to monitor.   Chrisandra Carota, RN 09/03/2020 5:17 PM

## 2020-09-03 NOTE — Anesthesia Procedure Notes (Signed)
Procedure Name: Intubation Date/Time: 09/03/2020 11:50 AM Performed by: Jonna Munro, CRNA Pre-anesthesia Checklist: Patient identified, Emergency Drugs available, Suction available, Patient being monitored and Timeout performed Patient Re-evaluated:Patient Re-evaluated prior to induction Oxygen Delivery Method: Circle system utilized Preoxygenation: Pre-oxygenation with 100% oxygen Induction Type: IV induction Ventilation: Mask ventilation without difficulty Laryngoscope Size: Mac and 3 Grade View: Grade I Tube type: Oral Tube size: 7.0 mm Number of attempts: 1 Airway Equipment and Method: Stylet Placement Confirmation: ETT inserted through vocal cords under direct vision, positive ETCO2 and breath sounds checked- equal and bilateral Secured at: 22 cm Tube secured with: Tape Dental Injury: Teeth and Oropharynx as per pre-operative assessment

## 2020-09-03 NOTE — H&P (Signed)
History and Physical Interval Note:  09/03/2020 11:02 AM  Heather Horton  has presented today for surgery, with the diagnosis of PAD.  The various methods of treatment have been discussed with the patient and family. After consideration of risks, benefits and other options for treatment, the patient has consented to  Procedure(s): BYPASS GRAFT FEMORAL-POPLITEAL ARTERY RIGHT (Right) as a surgical intervention.  The patient's history has been reviewed, patient examined, no change in status, stable for surgery.  I have reviewed the patient's chart and labs.  Questions were answered to the patient's satisfaction.    Plan right fem pop bypass.  [name]  REASON FOR CONSULT: Evaluate right leg pain and lower extremity swelling   HPI: Heather Horton is a 75 y.o. female, with history of anxiety, hypertension, hyperlipidemia, hepatitis C, stage III CKD that presents for evaluation of lower extremity swelling and right leg pain.  She states this has been an ongoing issue for the last year at least.  She does walk with a walker.  She states her right foot hurts her throughout the day and into the night at times.  Pain is into the bottom of the foot.  She denies any active tissue loss at this time.  In addition she complains about a fair amount of swelling and a varicosity on the right medial calf.  She did have home health screening that was evidence of PAD in the right leg.  She denies any previous lower extremity interventions.  Does smoke a pack a day.          Past Medical History:  Diagnosis Date   Acute encephalopathy     Allergy     Anemia     Anxiety     Arthritis     Asthma     Blood transfusion without reported diagnosis     Bronchitis     Bursitis     Cervical cancer ([place])     Chronic constipation     Chronic hepatitis C without hepatic coma ([place]) 10/17/2014    11/10/2015 visit with Dr. Linus Salmons. Repeat labs revealed resolution of infection following treatment.  [facility], Florentina Addison, [place], FNP-C; last seen 01/2014 Genotype 1b Diagnosed 01/2013   Colon polyps     Fibrocystic breast disease     Galactorrhea on right side     GERD (gastroesophageal reflux disease)     Hepatitis C     Hyperlipidemia     Hypertension     Insomnia     Osteoporosis     Respiratory failure ([place]) 03/20/2017   Seizures ([place])                 Past Surgical History:  Procedure Laterality Date   ABDOMINAL HYSTERECTOMY   30's    due to cervical cancer   APPENDECTOMY       BIOPSY   01/29/2020    Procedure: BIOPSY;  Surgeon: Irving Copas., MD;  Location: [facility] ENDOSCOPY;  Service: Gastroenterology;;   ESOPHAGOGASTRODUODENOSCOPY (EGD) WITH PROPOFOL N/A 01/29/2020    Procedure: ESOPHAGOGASTRODUODENOSCOPY (EGD) WITH PROPOFOL;  Surgeon: Irving Copas., MD;  Location: [place];  Service: Gastroenterology;  Laterality: N/A;   SAVORY DILATION N/A 01/29/2020    Procedure: [place] Setting DILATION;  Surgeon: Irving Copas., MD;  Location: [place];  Service: Gastroenterology;  Laterality: N/A;               Family History  Problem Relation Age of Onset  Asthma Mother     Colon cancer Sister 65   Stroke Brother 69   Stroke Sister 56   Cancer Sister 27        lung cancer, +TOBACCO   Arthritis Daughter     Pulmonary embolism Daughter 11        on chronic warfarin   Hypertension Son 83   Esophageal cancer Neg Hx     Rectal cancer Neg Hx     Stomach cancer Neg Hx        SOCIAL HISTORY: Social History             Socioeconomic History   Marital status: Single      Spouse name: n/a   Number of children: 3   Years of education: 14+   Highest education level: Not on file  Occupational History   Occupation: retired      Comment: phlebotomy, EKG tech; taught both  Tobacco Use   Smoking status: Current Every Day Smoker      Packs/day: 0.50      Years: 40.00      Pack years: 20.00      Types: Cigarettes   Smokeless tobacco:  Never Used   Tobacco comment: I'm scared of e-cigarettes, interested in patches, tobacco info given 01/10/15  Vaping Use   Vaping Use: Never used  Substance and Sexual Activity   Alcohol use: Not Currently   Drug use: No   Sexual activity: Not Currently  Other Topics Concern   Not on file  Social History Narrative    Raised in [place], [place].    Moved to [place], [place] and then came to [place] in 2013.    Lives alone.    Daughter lives around the corner.    Raised her niece as her own daughter, and she lives nearby.    Son lives in [place].    Right handed    Social Determinants of Health    Financial Resource Strain: Not on file  Food Insecurity: Not on file  Transportation Needs: Not on file  Physical Activity: Not on file  Stress: Not on file  Social Connections: Not on file  Intimate Partner Violence: Not on file               Allergies  Allergen Reactions   Molds & Smuts Other (See Comments)      Per allergy test   Pollen Extract Other (See Comments)      Per allergy test   Aspirin Nausea And Vomiting and Rash                 Current Outpatient Medications  Medication Sig Dispense Refill   albuterol (PROVENTIL HFA;VENTOLIN HFA) 108 (90 Base) MCG/ACT inhaler Inhale 2 puffs into the lungs every 6 (six) hours as needed for wheezing. 1 Inhaler 1   atorvastatin (LIPITOR) 10 MG tablet Take 1 tablet (10 mg total) by mouth daily. 90 tablet 3   busPIRone (BUSPAR) 5 MG tablet Take 5 mg by mouth 3 (three) times daily.       diazepam (VALIUM) 5 MG tablet Take 1 tablet 30 minutes prior to MRI. May take second dose if needed. 2 tablet 0   diclofenac Sodium (VOLTAREN) 1 % GEL Apply 2 g topically 4 (four) times daily. 50 g 1   ergocalciferol (VITAMIN D2) 1.25 MG (50000 UT) capsule Take 50,000 Units by mouth every Wednesday.       folic acid (FOLVITE) 694 MCG tablet Take  800 mcg by mouth in the morning.       gabapentin (NEURONTIN) 300 MG capsule Take 300 mg by mouth See admin  instructions. Take 300mg  qHS 1 week, 300mg  BID 1 week, 300mg  TID then on       HYDROcodone-acetaminophen (NORCO) 10-325 MG tablet Take 0.5-1 tablets by mouth in the morning and at bedtime.       hydrOXYzine (ATARAX/VISTARIL) 10 MG tablet Take 10 mg by mouth daily as needed for anxiety.       ketoconazole (NIZORAL) 2 % cream Apply 1 application topically as needed for irritation.       levETIRAcetam (KEPPRA) 500 MG tablet TAKE 1 TABLET BY MOUTH TWICE A DAY (Patient taking differently: Take 500 mg by mouth 2 (two) times daily.) 120 tablet 0   loratadine (CLARITIN) 10 MG tablet Take 10 mg by mouth daily as needed for allergies or rhinitis.       pantoprazole (PROTONIX) 40 MG tablet Take 40 mg by mouth at bedtime.       QUEtiapine (SEROQUEL) 300 MG tablet Take 1 tablet (300 mg total) by mouth at bedtime. 90 tablet 3   tetrahydrozoline-zinc (VISINE-AC) 0.05-0.25 % ophthalmic solution Place 1-2 drops into both eyes 3 (three) times daily as needed (for irritation).       mirtazapine (REMERON) 7.5 MG tablet Take 7.5 mg by mouth at bedtime.  (Patient not taking: Reported on 07/29/2020)        No current facility-administered medications for this visit.      REVIEW OF SYSTEMS:  [X]  denotes positive finding, [ ]  denotes negative finding Cardiac   Comments:  Chest pain or chest pressure:      Shortness of breath upon exertion:      Short of breath when lying flat:      Irregular heart rhythm:             Vascular      Pain in calf, thigh, or hip brought on by ambulation:      Pain in feet at night that wakes you up from your sleep:  x Right  Blood clot in your veins:      Leg swelling:             Pulmonary      Oxygen at home:      Productive cough:      Wheezing:             Neurologic      Sudden weakness in arms or legs:      Sudden numbness in arms or legs:      Sudden onset of difficulty speaking or slurred speech:      Temporary loss of vision in one eye:      Problems with dizziness:              Gastrointestinal      Blood in stool:      Vomited blood:             Genitourinary      Burning when urinating:      Blood in urine:             Psychiatric      Major depression:             Hematologic      Bleeding problems:      Problems with blood clotting too easily:             Skin  Rashes or ulcers:             Constitutional      Fever or chills:          PHYSICAL EXAM:      Vitals:    07/29/20 1352  BP: (!) 172/93  Pulse: 76  Temp: 98.1 F (36.7 C)  TempSrc: Skin  SpO2: 96%  Weight: 179 lb 3.2 oz (81.3 kg)  Height: 5\' 5"  (1.651 m)      GENERAL: The patient is a well-nourished female, in no acute distress. The vital signs are documented above. CARDIAC: There is a regular rate and rhythm. VASCULAR:  Palpable femoral pulses bilaterally Left DP palpable No palpable right pedal pulses Does have a right varicosity medial calf PULMONARY: There is good air exchange bilaterally without wheezing or rales. ABDOMEN: Soft and non-tender. MUSCULOSKELETAL: There are no major deformities or cyanosis. NEUROLOGIC: No focal weakness or paresthesias are detected. SKIN: There are no ulcers or rashes noted. PSYCHIATRIC: The patient has a normal affect.   DATA:    Indications: Claudication.   High Risk Factors: Hypertension, current smoker.     Performing Technologist: Delorise Shiner RVT      Examination Guidelines: A complete evaluation includes at minimum, Doppler  waveform signals and systolic blood pressure reading at the level of  bilateral  brachial, anterior tibial, and posterior tibial arteries, when vessel  segments  are accessible. Bilateral testing is considered an integral part of a  complete  examination. Photoelectric Plethysmograph (PPG) waveforms and toe systolic  pressure readings are included as required and additional duplex testing  as  needed. Limited examinations for reoccurring indications may be performed  as   noted.      ABI Findings:  +---------+------------------+-----+----------+--------+  Right    Rt Pressure (mmHg)IndexWaveform  Comment   +---------+------------------+-----+----------+--------+  Brachial 155                                        +---------+------------------+-----+----------+--------+  ATA      106               0.68                     +---------+------------------+-----+----------+--------+  PTA      99                0.64 monophasic          +---------+------------------+-----+----------+--------+  DP                              monophasic          +---------+------------------+-----+----------+--------+  Great Toe30                0.19                     +---------+------------------+-----+----------+--------+   +---------+------------------+-----+--------+-------+  Left     Lt Pressure (mmHg)IndexWaveformComment  +---------+------------------+-----+--------+-------+  Brachial 155                                     +---------+------------------+-----+--------+-------+  ATA      140               0.90                  +---------+------------------+-----+--------+-------+  PTA      156               1.01 biphasic         +---------+------------------+-----+--------+-------+  DP                              biphasic         +---------+------------------+-----+--------+-------+  Great Toe109               0.70                  +---------+------------------+-----+--------+-------+   +-------+-----------+-----------+------------+------------+  ABI/TBIToday's ABIToday's TBIPrevious ABIPrevious TBI  +-------+-----------+-----------+------------+------------+  Right  0.68       0.19                                 +-------+-----------+-----------+------------+------------+  Left   1.01       0.70                                  +-------+-----------+-----------+------------+------------+          Summary:  Right: Resting right ankle-brachial index indicates moderate right lower  extremity arterial disease. The right toe-brachial index is abnormal. RT  great toe pressure = 30 mmHg.   Left: Resting left ankle-brachial index is within normal range. No  evidence of significant left lower extremity arterial disease. The left  toe-brachial index is normal. LT Great toe pressure = 109 mmHg.   Assessment/Plan:   75 year old female with multiple medical comorbidities presents for evaluation of a multitude of symptoms including foot pain in the right lower extremity as well as leg swelling.  In trying to decipher her symptoms, it sounds like the most bothersome issue at this time is pain in the right foot and calf that at times keeps her awake at night.  She does have much more diminished flow in the right leg compared to the left and I cannot palpate any pulses in the right foot compared to an easily palpable dorsalis pedis in the left foot.  Certainly discussed that this could be peripheral arterial disease as underlying etiology.  I have recommended aortogram with lower extremity arteriogram and possible intervention.  Discussed we will address her leg swelling a later time likely with a venous reflux study but will address her arterial disease first.     [name], MD [facility]: [phone]

## 2020-09-03 NOTE — Op Note (Addendum)
Date: September 03, 2020  Preoperative diagnosis: Peripheral arterial disease of the right lower extremity with underlying rest pain  Postoperative diagnosis: Same  Procedure: 1.  Harvest of right leg great saphenous vein 2.  Right common femoral to below-knee popliteal artery bypass with ipsilateral nonreversed great saphenous vein  Surgeon: Dr. [name], MD  Assistant: Roxy Horseman, PA  Indications: Patient is a 75 year old female who was seen in the office with right foot pain with abnormal ABIs that were 0.6 and monophasic at the ankle with a severely depressed toe pressure.  She underwent arteriogram that showed occluded SFA with underlying tibial disease.  She presents today for planned right common femoral to below-knee popliteal bypass after risk benefits discussed.  An assistant was needed for exposure and to expedite the case.  Findings: The right great saphenous vein was harvested from the saphenofemoral junction to the mid calf and was of good caliber.  She did have a high common femoral bifurcation in the right groin.The bypass was then sewn in non-reversed with great saphenous vein from the common femoral to the below-knee popliteal artery tunneled subfascial and subsartorial.  She had a palpable AT with a brisk dorsalis and posterior tibial signal at completion.    Anesthesia: General  Details: Patient was taken to the operating room after informed consent was obtained.  Placed on the operating table in supine position and general endotracheal anesthesia was induced.  Ultimately prior to prepping the patient, I used ultrasound to mark the saphenous vein in the right leg from the saphenofemoral junction down to the mid calf.  The right groin and right leg was then prepped and draped in standard sterile fashion.  Timeout was performed.  Antibiotics were given.  Initially made a horizontal groin incision in the right groin over the femoral pulse and above the inguinal  crease.  Dissected down above the inguinal crease and opened the subcutaneous tissue with Bovie cautery and the femoral sheath was opened longitudinally.  Ultimately dissected out the SFA and profunda and appeared to have a high femoral bifurcation and then the common femoral and distal external iliac artery were dissected out.  This appeared soft anteriorly for sewing.  There was a plaque on the posterior wall.  I then made three skip incisions down the right leg with two in the upper thigh and one in the calf and the great saphenous vein was then harvested and all side branches were ligated between 3-0 silk ties and divided.  Once I felt I had enough vein it was ligated over right angle clamp in the distal calf and then the vein was passed through all the skin tunnels and then at the saphenofemoral junction was oversewn with a 5-0 Prolene over right angle clamp after was passed off the field.  The vein was then reversed, a vessel cannula was placed, it dilated of good caliber.  Multiple side branches had to be repaired with 6-0 Prolene's.  I then went to the below-knee saphenectomy incision medially and entered into the popliteal space and the popliteal artery below the knee was mobilized off the veins and Vesseloops were placed proximally distally and this appeared nice and soft for anastomosis.  I then brought a large Gore tunneler on the field and tunneled from the below-knee popliteal up to the groin in a subfascial subsartorial plane.  Patient was then given 100 units per kilogram IV heparin.  We used Vesseloops on the SFA profunda and a Henley clamp on the  distal external iliac.  I then opened the common femoral with 11 blade scalpel extended with Potts scissors and then the vein was brought on the field in non-reversed fashion the proximal anastomosis was sewn to the common femoral 5-0 Prolene parachute technique.  We did de-air everything thing prior to completion.  There was a good pulse proximally but I  needed to lyse the valves given this was a non-reversed bypass.  I then used a valvulotome and carefully lysed all the valves until we had good pulsatile flow distally.  I then placed 2 medium clips on the distal bypass.  The vein was then marked to ensure it was not twisted.  I then attached this to the Gore tunneler with a 2-0 silk tie.  I then passed the vein through the tunneler ensuring not to twist it.  The leg was then straightened and the vein was then marked after the appropriate length.  I then controlled the below-knee popliteal artery with Vesseloops and this was opened with 11 blade scalpel extended Potts scissors.  I then spatulated the distal vein and an end-to-side anastomosis with 6-0 Prolene was sewn to the below knee popliteal artery.  I de-aired everything prior to completion.  We had brisk dorsalis pedis posterior tibial signals.  Protamine was given for reversal.  I then closed all the incisions with multiple layers of 2-0 Vicryl 3-0 Vicryl 4-0 Monocryl and Dermabond.  Complication: None  Condition: Stable  [name], MD [facility] [place] Office: [place]

## 2020-09-03 NOTE — Transfer of Care (Signed)
Immediate Anesthesia Transfer of Care Note  Patient: Heather Horton  Procedure(s) Performed: BYPASS GRAFT FEMORAL-POPLITEAL ARTERY USING NON-REVERSED GREATER SAPHENOUS VEIN RIGHT (Right)  Patient Location: PACU  Anesthesia Type:General  Level of Consciousness: awake, alert , oriented and patient cooperative  Airway & Oxygen Therapy: Patient Spontanous Breathing  Post-op Assessment: Report given to RN, Post -op Vital signs reviewed and stable and Patient moving all extremities X 4  Post vital signs: Reviewed and stable  Last Vitals:  Vitals Value Taken Time  BP 132/58 09/03/20 1603  Temp    Pulse 73 09/03/20 1605  Resp 12 09/03/20 1605  SpO2 98 % 09/03/20 1605  Vitals shown include unvalidated device data.  Last Pain:  Vitals:   09/03/20 0943  TempSrc:   PainSc: 7          Complications: No notable events documented.

## 2020-09-04 ENCOUNTER — Encounter (HOSPITAL_COMMUNITY): Payer: Self-pay | Admitting: Vascular Surgery

## 2020-09-04 LAB — CBC
HCT: 31.6 % — ABNORMAL LOW (ref 36.0–46.0)
Hemoglobin: 10.6 g/dL — ABNORMAL LOW (ref 12.0–15.0)
MCH: 32.7 pg (ref 26.0–34.0)
MCHC: 33.5 g/dL (ref 30.0–36.0)
MCV: 97.5 fL (ref 80.0–100.0)
Platelets: 227 10*3/uL (ref 150–400)
RBC: 3.24 MIL/uL — ABNORMAL LOW (ref 3.87–5.11)
RDW: 14.3 % (ref 11.5–15.5)
WBC: 6.4 10*3/uL (ref 4.0–10.5)
nRBC: 0 % (ref 0.0–0.2)

## 2020-09-04 LAB — COMPREHENSIVE METABOLIC PANEL
ALT: 11 U/L (ref 0–44)
AST: 21 U/L (ref 15–41)
Albumin: 3.3 g/dL — ABNORMAL LOW (ref 3.5–5.0)
Alkaline Phosphatase: 50 U/L (ref 38–126)
Anion gap: 7 (ref 5–15)
BUN: 12 mg/dL (ref 8–23)
CO2: 26 mmol/L (ref 22–32)
Calcium: 8.8 mg/dL — ABNORMAL LOW (ref 8.9–10.3)
Chloride: 104 mmol/L (ref 98–111)
Creatinine, Ser: 0.99 mg/dL (ref 0.44–1.00)
GFR, Estimated: >60 mL/min (ref >60)
Glucose, Bld: 132 mg/dL — ABNORMAL HIGH (ref 70–99)
Potassium: 3.7 mmol/L (ref 3.5–5.1)
Sodium: 137 mmol/L (ref 135–145)
Total Bilirubin: 0.6 mg/dL (ref 0.3–1.2)
Total Protein: 6.4 g/dL — ABNORMAL LOW (ref 6.5–8.1)

## 2020-09-04 LAB — POCT ACTIVATED CLOTTING TIME
Activated Clotting Time: 335 seconds
Activated Clotting Time: 288 seconds

## 2020-09-04 LAB — LIPID PANEL
Cholesterol: 139 mg/dL (ref 0–200)
HDL: 59 mg/dL (ref >40)
LDL Cholesterol: 68 mg/dL (ref 0–99)
Total CHOL/HDL Ratio: 2.4 RATIO
Triglycerides: 58 mg/dL (ref <150)
VLDL: 12 mg/dL (ref 0–40)

## 2020-09-04 NOTE — Evaluation (Signed)
Physical Therapy Evaluation Patient Details Name: Kathline Banbury MRN:  DOB: 01/25/1946 Today's Date: 09/04/2020   History of Present Illness  Patient is a 75 year old female admitted 7/6,  s/p Right common femoral to below-knee popliteal artery bypass. PMH includes Hepatitis C, anxiety, arthritis, cervical CA  Clinical Impression  Pt admitted with/for R LE ischemic pain, s/p Fem-Pop BPG.  Pt needing min to mod assist for basic mobility and gaiit.  Pt currently limited functionally due to the problems listed below.  (see problems list.)  Pt will benefit from PT to maximize function and safety to be able to get home safely with available assist.     Follow Up Recommendations No PT follow up;Supervision - Intermittent (vs HHPT if slower to improve)    Equipment Recommendations  None recommended by PT;Other (comment) (TBA)    Recommendations for Other Services       Precautions / Restrictions Precautions Precautions: Fall Restrictions Weight Bearing Restrictions: No      Mobility  Bed Mobility Overal bed mobility: Needs Assistance Bed Mobility: Sit to Supine     Supine to sit: Mod assist;HOB elevated Sit to supine: Mod assist   General bed mobility comments: cues for direction/sequencing, assist R LE into bed.    Transfers Overall transfer level: Needs assistance Equipment used: 4-wheeled walker Transfers: Sit to/from Stand Sit to Stand: Min assist         General transfer comment: Cues for hand placement, use of the brakes.  Assist for stability and foward, pt boosted well.  Ambulation/Gait Ambulation/Gait assistance: Min Web designer (Feet): 12 Feet Assistive device: 4-wheeled walker Gait Pattern/deviations: Step-to pattern;Step-through pattern   Gait velocity interpretation: <1.31 ft/sec, indicative of household ambulator General Gait Details: mildly unsteady and antalgic on the right.  Pt losing focus, reaching for the sink, getting distracted by her  son.  Pt also needed minimal assist maneuvering the rollator.  Stairs            Wheelchair Mobility    Modified Rankin (Stroke Patients Only)       Balance Overall balance assessment: Needs assistance Sitting-balance support: Feet supported Sitting balance-Leahy Scale: Good     Standing balance support: Bilateral upper extremity supported Standing balance-Leahy Scale: Poor Standing balance comment: reliant on UE support and up to mod A for safety                             Pertinent Vitals/Pain Pain Assessment: 0-10 Pain Score: 5  Faces Pain Scale: Hurts even more Pain Location: R LE Pain Descriptors / Indicators: Grimacing;Discomfort Pain Intervention(s): Monitored during session    Home Living Family/patient expects to be discharged to:: Private residence Living Arrangements: Alone Available Help at Discharge: Family (DTR comes everyday) Type of Home: House Home Access: Stairs to enter   [employer] of Steps: threshold Home Layout: One level Home Equipment: Bedside commode;Walker - 4 wheels;Shower seat      Prior Function Level of Independence: Needs assistance   Gait / Transfers Assistance Needed: sleeps in recliner, can ambulate with rollator mod I in house  ADL's / Homemaking Assistance Needed: DTR assists with dressing, bathing, IADLs. can toilet herself. per DTR patient had worked with [facility] PT + OT for 5 months stopped in May and patient had been getting back to performing ADLs such as dressing herself        Hand Dominance   Dominant Hand: Right  Extremity/Trunk Assessment   Upper Extremity Assessment Upper Extremity Assessment: Generalized weakness    Lower Extremity Assessment Lower Extremity Assessment: Generalized weakness    Cervical / Trunk Assessment Cervical / Trunk Assessment: Kyphotic  Communication   Communication: No difficulties  Cognition Arousal/Alertness: Awake/alert Behavior During Therapy:  WFL for tasks assessed/performed Overall Cognitive Status: Impaired/Different from baseline Area of Impairment: Attention;Following commands;Safety/judgement;Problem solving                   Current Attention Level: Focused   Following Commands: Follows one step commands inconsistently;Follows one step commands with increased time Safety/Judgement: Decreased awareness of safety;Decreased awareness of deficits   Problem Solving: Slow processing;Decreased initiation;Difficulty sequencing;Requires verbal cues;Requires tactile cues General Comments: patient is tangential and easily distracted needing increased time and multiple cues to direct to tasks      General Comments General comments (skin integrity, edema, etc.): vss on RA    Exercises     Assessment/Plan    PT Assessment Patient needs continued PT services  PT Problem List Decreased strength;Decreased activity tolerance;Decreased mobility;Decreased knowledge of use of DME;Pain;Decreased safety awareness       PT Treatment Interventions DME instruction;Gait training;Functional mobility training;Therapeutic activities;Balance training;Patient/family education    PT Goals (Current goals can be found in the Care Plan section)  Acute Rehab PT Goals Patient Stated Goal: bring patient home with family assist and HH PT Goal Formulation: With patient Time For Goal Achievement: 09/11/20 Potential to Achieve Goals: Good    Frequency Min 3X/week   Barriers to discharge        Co-evaluation               AM-PAC PT "6 Clicks" Mobility  Outcome Measure Help needed turning from your back to your side while in a flat bed without using bedrails?: A Little Help needed moving from lying on your back to sitting on the side of a flat bed without using bedrails?: A Lot Help needed moving to and from a bed to a chair (including a wheelchair)?: A Little Help needed standing up from a chair using your arms (e.g., wheelchair  or bedside chair)?: A Little Help needed to walk in hospital room?: A Little Help needed climbing 3-5 steps with a railing? : A Little 6 Click Score: 17    End of Session   Activity Tolerance: Patient tolerated treatment well;Patient limited by pain Patient left: in bed;with call bell/phone within reach;with bed alarm set;with family/visitor present Nurse Communication: Mobility status PT Visit Diagnosis: Unsteadiness on feet (R26.81);Other abnormalities of gait and mobility (R26.89);Pain;Difficulty in walking, not elsewhere classified (R26.2) Pain - Right/Left: Right Pain - part of body: Leg    Time: 1550-1620 PT Time Calculation (min) (ACUTE ONLY): 30 min   Charges:   PT Evaluation $PT Eval Moderate Complexity: 1 Mod PT Treatments $Gait Training: 8-22 mins        09/04/2020  Ginger Carne., PT Acute Rehabilitation Services [phone]  (pager) [phone]  (office)  Tessie Fass Rosmary Dionisio 09/04/2020, 4:31 PM

## 2020-09-04 NOTE — Progress Notes (Addendum)
[facility] [place]  Subjective  - Sever pain response has right LE flexed in protection.    Objective (!) 105/56 (!) 102 99.4 F (37.4 C) (Oral) 18 97%  Intake/Output Summary (Last 24 hours) at 09/04/2020 0738 Last data filed at 09/03/2020 1840 Gross per 24 hour  Intake 1400 ml  Output 500 ml  Net 900 ml    Doppler brisk AT/PT Leg incisions healing well, groin without hematoma, dry wash cloth placed in groin crease Leg incision healing well Lungs non labored breathing  Assessment/Planning: POD # 1 Right common femoral to below-knee popliteal artery bypass with ipsilateral nonreversed great saphenous vein for  right foot pain with abnormal ABIs that were 0.6 and monophasic at the ankle with a severely depressed toe pressure.  Encouraged movement of the right LE to prevent contracture Good inflow with brisk doppler signals PT/OT eval and treatment ordered.     Roxy Horseman 09/04/2020 7:38 AM --  Laboratory Lab Results: Recent Labs    09/03/20 1859 09/04/20 0115  WBC 9.9 6.4  HGB 11.7* 10.6*  HCT 35.0* 31.6*  PLT 302 227   BMET Recent Labs    09/03/20 1859 09/04/20 0115  NA  --  137  K  --  3.7  CL  --  104  CO2  --  26  GLUCOSE  --  132*  BUN  --  12  CREATININE 0.73 0.99  CALCIUM  --  8.8*    COAG Lab Results  Component Value Date   INR 1.0 08/28/2020   INR 1.12 03/20/2017   INR 1.08 03/20/2017   No results found for: PTT   I have seen and evaluated the patient. I agree with the PA note as documented above.  Postop day 1 status post right common femoral to below-knee popliteal bypass.  She is fairly sleepy this morning.  All of her incisions are clean dry and intact.  She does have a palpable right AT pulse.  Brisk dorsalis pedis and posterior tibial signals in right foot.  Will allow to work with therapy.  Minimize IV narcotics.  Postop labs look good  [name], MD [facility]  [place] Office: [phone]

## 2020-09-04 NOTE — Anesthesia Postprocedure Evaluation (Signed)
Anesthesia Post Note  Patient: Heather Horton  Procedure(s) Performed: BYPASS GRAFT FEMORAL-POPLITEAL ARTERY USING NON-REVERSED GREATER SAPHENOUS VEIN RIGHT (Right)     Patient location during evaluation: PACU Anesthesia Type: General Level of consciousness: awake and alert Pain management: pain level controlled Vital Signs Assessment: post-procedure vital signs reviewed and stable Respiratory status: spontaneous breathing, nonlabored ventilation, respiratory function stable and patient connected to nasal cannula oxygen Cardiovascular status: blood pressure returned to baseline and stable Postop Assessment: no apparent nausea or vomiting Anesthetic complications: no   No notable events documented.  Last Vitals:  Vitals:   09/04/20 1805 09/04/20 2014  BP: 108/60 (!) 111/44  Pulse: (!) 107   Resp: 18 20  Temp: 37.8 C 37.8 C  SpO2: 99% 100%    Last Pain:  Vitals:   09/04/20 2014  TempSrc: Oral  PainSc:                  [name]

## 2020-09-04 NOTE — Evaluation (Signed)
Occupational Therapy Evaluation Patient Details Name: Heather Horton MRN: [id number] DOB: [birth year] Today's Date: 09/04/2020    History of Present Illness Patient is a 75 year old female s/p Right common femoral to below-knee popliteal artery bypass. PMH includes Hepatitis C, anxiety, arthritis, cervical CA   Clinical Impression   Patient lives alone in a single level house with threshold step to enter. Daughter comes over twice a day once before work and then after work in which she stays until patient goes to bed. Per daughter patient finished HH PT and OT in May after ~5 months "due to an infection, she lost so much weight." Patient had been getting back to dressing herself and was able to ambulate with rollator to perform toilet tasks, daughter assists with bathing, IADL tasks. Currently patient needing increased assist with self care tasks due to r leg pain, impaired cognition and safety. Patient is easily distracted needing max cues to attend to task. Patient stating "I can do it" but ultimately needing mod A for bed mobility to bring R leg off bed and to shift hips to edge of bed. Patient also needing mod A x2 for safety to stand from edge of bed with rollator, cues for hand placement and max cues to try and side step to head of bed. Discuss with daughter that they would like patient to D/C home with home health and that 24/7 assist could be arranged while patient recovering. Acute OT to follow.     Follow Up Recommendations  Home health OT;Supervision/Assistance - 24 hour    Equipment Recommendations  None recommended by OT;Other (comment) (had all DME needs at home)       Precautions / Restrictions Precautions Precautions: Fall Restrictions Weight Bearing Restrictions: No      Mobility Bed Mobility Overal bed mobility: Needs Assistance Bed Mobility: Supine to Sit     Supine to sit: Mod assist;HOB elevated     General bed mobility comments: patient needing increased time  stating "I can do it" however lays onto head of bed having difficulty advancing R LE to edge of bed ultimately needing mod A and use of bed pad to scoot hips to edge of bed. daughter asking if patient can stay sitting up at EOB at end of session, informed DTR to not leave her unattended like this. verbalize understanding    Transfers Overall transfer level: Needs assistance Equipment used: 4-wheeled walker Transfers: Sit to/from Stand Sit to Stand: Mod assist;+2 safety/equipment         General transfer comment: please see toilet transfer in ADL section, poor safety awareness needing cues for hand placement, difficulty placing weight through R LE to try side stepping    Balance Overall balance assessment: Needs assistance Sitting-balance support: Feet supported Sitting balance-Leahy Scale: Good     Standing balance support: Bilateral upper extremity supported Standing balance-Leahy Scale: Poor Standing balance comment: reliant on UE support and up to mod A for safety                           ADL either performed or assessed with clinical judgement   ADL Overall ADL's : Needs assistance/impaired     Grooming: Set up;Sitting   Upper Body Bathing: Set up;Sitting   Lower Body Bathing: Moderate assistance;Sitting/lateral leans;Sit to/from stand   Upper Body Dressing : Set up;Sitting   Lower Body Dressing: Maximal assistance;Sitting/lateral leans;Sit to/from stand Lower Body Dressing Details (indicate cue type and reason): attempts  to don sock however due to pain unable, DTR reports has helped patient in past when needed for dressing/ bathing Toilet Transfer: Moderate assistance;+2 for safety/equipment;Cueing for safety;Cueing for sequencing;RW Toilet Transfer Details (indicate cue type and reason): mod A x1-2 to power up to standing with cues for hand placement to push from bed. patient having difficulty putting weight through R LE and needing cues + increased time to  take small side steps to head of bed before fatigues and needing to sit. Toileting- Clothing Manipulation and Hygiene: Total assistance;Sitting/lateral lean;Sit to/from stand Toileting - Clothing Manipulation Details (indicate cue type and reason): reliant on UE support, poor safety     Functional mobility during ADLs: Moderate assistance;+2 for safety/equipment;Cueing for safety;Cueing for sequencing;Rolling walker General ADL Comments: patient requiring assistance for self care tasks due to impaired cognition, safety awareness, balance and pain limiting activity tolerance      Pertinent Vitals/Pain Pain Assessment: Faces Faces Pain Scale: Hurts even more Pain Location: R LE Pain Descriptors / Indicators: Grimacing;Discomfort Pain Intervention(s): Monitored during session     Hand Dominance Right   Extremity/Trunk Assessment Upper Extremity Assessment Upper Extremity Assessment: Generalized weakness   Lower Extremity Assessment Lower Extremity Assessment: Defer to PT evaluation       Communication Communication Communication: No difficulties   Cognition Arousal/Alertness: Awake/alert Behavior During Therapy: WFL for tasks assessed/performed Overall Cognitive Status: Impaired/Different from baseline Area of Impairment: Attention;Following commands;Safety/judgement;Problem solving                   Current Attention Level: Focused   Following Commands: Follows one step commands inconsistently;Follows one step commands with increased time Safety/Judgement: Decreased awareness of safety;Decreased awareness of deficits   Problem Solving: Slow processing;Decreased initiation;Difficulty sequencing;Requires verbal cues;Requires tactile cues General Comments: patient is tangential and easily distracted needing increased time and multiple cues to direct to tasks   General Comments  O2 95% on room air, notified RN            Home Living Family/patient expects to be  discharged to:: Private residence Living Arrangements: Alone Available Help at Discharge: Family (DTR comes everyday) Type of Home: House Home Access: Stairs to enter Entrance Stairs-Number of Steps: threshold   Home Layout: One level     Bathroom Shower/Tub: Teacher, early years/pre: [place]: Bedside commode;Walker - 4 wheels;Shower seat          Prior Functioning/Environment Level of Independence: Needs assistance  Gait / Transfers Assistance Needed: sleeps in recliner, can ambulate with rollator mod I in house ADL's / Homemaking Assistance Needed: DTR assists with dressing, bathing, IADLs. can toilet herself. per DTR patient had worked with [facility] PT + OT for 5 months stopped in May and patient had been getting back to performing ADLs such as dressing herself            OT Problem List: Decreased strength;Decreased activity tolerance;Impaired balance (sitting and/or standing);Decreased safety awareness;Decreased cognition;Pain      OT Treatment/Interventions: Self-care/ADL training;Balance training;Patient/family education;Therapeutic activities;DME and/or AE instruction;Cognitive remediation/compensation    OT Goals(Current goals can be found in the care plan section) Acute Rehab OT Goals Patient Stated Goal: bring patient home with family assist and HH OT Goal Formulation: With patient/family Time For Goal Achievement: 09/18/20 Potential to Achieve Goals: Good ADL Goals Pt Will Transfer to Toilet: with supervision;ambulating (rollator, 3 in 1 over toilet) Pt Will Perform Toileting - Clothing Manipulation and hygiene: with supervision;sit to/from stand;sitting/lateral  leans Additional ADL Goal #1: Patient will be supervision level for standing activity for 5 minutes in order to participate in self care tasks. Additional ADL Goal #2: Patient will follow 1 step directions with less than 25% verbal cues in order to safely participate in self care  tasks.  OT Frequency: Min 2X/week    AM-PAC OT "6 Clicks" Daily Activity     Outcome Measure Help from another person eating meals?: None Help from another person taking care of personal grooming?: A Little Help from another person toileting, which includes using toliet, bedpan, or urinal?: A Lot Help from another person bathing (including washing, rinsing, drying)?: A Lot Help from another person to put on and taking off regular upper body clothing?: A Little Help from another person to put on and taking off regular lower body clothing?: A Lot 6 Click Score: 16   End of Session Equipment Utilized During Treatment: Gait belt;Rolling walker Nurse Communication: Mobility status  Activity Tolerance: Patient limited by pain Patient left: with family/visitor present;Other (comment);with call bell/phone within reach (seated EOB)  OT Visit Diagnosis: Unsteadiness on feet (R26.81);Other abnormalities of gait and mobility (R26.89);Pain Pain - Right/Left: Right Pain - part of body: Leg                Time: [id number] OT Time Calculation (min): 32 min Charges:  OT General Charges $OT Visit: 1 Visit OT Evaluation $OT Eval Moderate Complexity: 1 Mod OT Treatments $Self Care/Home Management : 8-22 mins  Delbert Phenix OT OT pager: [place] 09/04/2020, 1:13 PM

## 2020-09-05 ENCOUNTER — Ambulatory Visit: Payer: Medicare HMO | Admitting: Neurology

## 2020-09-05 MED ORDER — ATORVASTATIN CALCIUM 40 MG PO TABS
40.0000 mg | ORAL_TABLET | Freq: Every day | ORAL | Status: DC
  Administered 2020-09-06 – 2020-09-07 (×2): 40 mg via ORAL
  Filled 2020-09-05 (×2): qty 1

## 2020-09-05 NOTE — Progress Notes (Signed)
PHARMACIST LIPID MONITORING   Heather Horton is a 75 y.o. female admitted on 09/03/2020 with peripheral arterial disease.  Pharmacy has been consulted to optimize lipid-lowering therapy with the indication of secondary prevention for clinical ASCVD.  Recent Labs:  Lipid Panel (last 6 months):   Lab Results  Component Value Date   CHOL 139 09/04/2020   TRIG 58 09/04/2020   HDL 59 09/04/2020   CHOLHDL 2.4 09/04/2020   VLDL 12 09/04/2020   LDLCALC 68 09/04/2020    Hepatic function panel (last 6 months):   Lab Results  Component Value Date   AST 21 09/04/2020   ALT 11 09/04/2020   ALKPHOS 50 09/04/2020   BILITOT 0.6 09/04/2020    SCr (since admission):   Serum creatinine: 0.99 mg/dL 09/04/20 0115 Estimated creatinine clearance: 53.4 mL/min  Current therapy and lipid therapy tolerance Current lipid-lowering therapy: atorvastatin 10mg   Previous lipid-lowering therapies (if applicable): n/a Documented or reported allergies or intolerances to lipid-lowering therapies (if applicable): n/a  Assessment:   Will optimize patient to high dose atorvastatin for LDL goal closer to 50.   Plan:    1.Statin intensity (high intensity recommended for all patients regardless of the LDL):  Add or increase statin to high intensity.  2.Add ezetimibe (if any one of the following):   Not indicated at this time.  3.Refer to lipid clinic:   No  4.Follow-up with:  Primary care provider - [name], PA  5.Follow-up labs after discharge:  Changes in lipid therapy were made. Check a lipid panel in 8-12 weeks then annually.      [name] PharmD., BCPS Clinical Pharmacist 09/05/2020 1:37 PM

## 2020-09-05 NOTE — Progress Notes (Signed)
Occupational Therapy Treatment Patient Details Name: Heather Horton MRN: [id number] DOB: [birth year] Today's Date: 09/05/2020    History of present illness Patient is a 75 year old female admitted 7/6,  s/p Right common femoral to below-knee popliteal artery bypass. PMH includes Hepatitis C, anxiety, arthritis, cervical CA   OT comments  Patient reporting pain sharp penetrating in R leg despite pain medication this AM, stated it helped "a little." Patient min A for bed mobility needing less time than previous session due to improved mentation. Demonstrate to patient to use momentum and for hand placement to push up from edge of bed to stand. Patient needing two attempts and min A to complete. Patient still minimizing weight placed through R LE due to pain and needing cues for safety not to let go of rollator prematurely. Recommend continued acute OT to facilitate D/C home with family.    Follow Up Recommendations  Home health OT;Supervision/Assistance - 24 hour    Equipment Recommendations  None recommended by OT       Precautions / Restrictions Precautions Precautions: Fall Restrictions Weight Bearing Restrictions: No       Mobility Bed Mobility Overal bed mobility: Needs Assistance Bed Mobility: Supine to Sit     Supine to sit: Min assist;HOB elevated     General bed mobility comments: min A to bring R LE to edge of bed and for trunk support. cue to push through elbow to sit upright    Transfers Overall transfer level: Needs assistance Equipment used: 4-wheeled walker Transfers: Sit to/from Stand Sit to Stand: Min assist         General transfer comment: please see toilet transfer in ADL section, patient is high fall risk due to safety and pain limiting activity tolerance however improved from yesterday's OT session    Balance Overall balance assessment: Needs assistance Sitting-balance support: Feet supported Sitting balance-Leahy Scale: Good     Standing balance  support: Bilateral upper extremity supported Standing balance-Leahy Scale: Poor Standing balance comment: reliant on UE support and min A                           ADL either performed or assessed with clinical judgement   ADL Overall ADL's : Needs assistance/impaired     Grooming: Wash/dry face;Set up;Sitting               Lower Body Dressing: Total assistance;Bed level Lower Body Dressing Details (indicate cue type and reason): to don sock Toilet Transfer: Minimal assistance;Stand-pivot;Cueing for safety;Cueing for sequencing (rollator) Toilet Transfer Details (indicate cue type and reason): to recliner, patient needing cues to use momentum to assist with powering up to standing as well as cues to push from bed vs pulling on rollator. patient needing two attempts and min A to power up to standing and steady. cue patient to sequence stepping towards recliner as well as for safety as patient lets go of walker to hold onto recliner before lined up with chair. also needing cues to reach back to assist with eccentric control into chair         Functional mobility during ADLs: Minimal assistance;Cueing for safety;Cueing for sequencing;Rolling walker General ADL Comments: improved mobility this session however patient still limiting weight bearing in R leg due to pain      Cognition Arousal/Alertness: Awake/alert Behavior During Therapy: WFL for tasks assessed/performed Overall Cognitive Status: No family/caregiver present to determine baseline cognitive functioning  General Comments: patient appears more clear this AM following directions more appropriately, increased time more related to pain this session than cognition                   Pertinent Vitals/ Pain       Pain Assessment: Faces Faces Pain Scale: Hurts even more Pain Location: R LE Pain Descriptors / Indicators: Grimacing;Discomfort Pain Intervention(s):  Monitored during session;Other (comment) (notified RN)         Frequency  Min 2X/week        Progress Toward Goals  OT Goals(current goals can now be found in the care plan section)  Progress towards OT goals: Progressing toward goals  Acute Rehab OT Goals Patient Stated Goal: bring patient home with family assist and HH OT Goal Formulation: With patient/family Time For Goal Achievement: 09/18/20 Potential to Achieve Goals: Good ADL Goals Pt Will Transfer to Toilet: with supervision;ambulating (rollator 3 in 1 over toilet) Pt Will Perform Toileting - Clothing Manipulation and hygiene: with supervision;sit to/from stand;sitting/lateral leans Additional ADL Goal #1: Patient will be supervision level for standing activity for 5 minutes in order to participate in self care tasks. Additional ADL Goal #2: Patient will follow 1 step directions with less than 25% verbal cues in order to safely participate in self care tasks.  Plan Discharge plan remains appropriate       AM-PAC OT "6 Clicks" Daily Activity     Outcome Measure   Help from another person eating meals?: None Help from another person taking care of personal grooming?: A Little Help from another person toileting, which includes using toliet, bedpan, or urinal?: A Lot Help from another person bathing (including washing, rinsing, drying)?: A Lot Help from another person to put on and taking off regular upper body clothing?: A Little Help from another person to put on and taking off regular lower body clothing?: A Lot 6 Click Score: 16    End of Session Equipment Utilized During Treatment: Gait belt;Rolling walker  OT Visit Diagnosis: Unsteadiness on feet (R26.81);Other abnormalities of gait and mobility (R26.89);Pain Pain - Right/Left: Right Pain - part of body: Leg   Activity Tolerance Patient limited by pain;Patient tolerated treatment well   Patient Left in chair;with call bell/phone within reach;with chair  alarm set   Nurse Communication Mobility status;Other (comment) (patient reporting pain)        Time: 0831-0900 OT Time Calculation (min): 29 min  Charges: OT General Charges $OT Visit: 1 Visit OT Treatments $Self Care/Home Management : 23-37 mins  Delbert Phenix OT OT pager: [phone]   Rosemary Holms 09/05/2020, 9:09 AM

## 2020-09-05 NOTE — Progress Notes (Addendum)
Progress Note    09/05/2020 7:37 AM 2 Days Post-Op  Subjective:  complaining of incisional pain. No N, V or CP. Has not yet been OOB. PT eval: none vs HHPT if slow to improve  Vitals:   09/04/20 2310 09/05/20 0333  BP: 140/61 (!) 118/58  Pulse:    Resp: 20 20  Temp: 99.6 F (37.6 C) 98.1 F (36.7 C)  SpO2: 98% 98%    Physical Exam:  General appearance: Awake, alert in no apparent distress Cardiac: Heart rate and rhythm are regular Respirations: Nonlabored Incisions: Righ groin, thigh and lower leg incisions are all well approximated without bleeding or hematoma Extremities: Both feet are warm with intact sensation and motor function.   Pulse/Doppler exam: Brisk right dorsalis pedis, posterior tibial and peroneal artery Doppler signals    CBC    Component Value Date/Time   WBC 6.4 09/04/2020 0115   RBC 3.24 (L) 09/04/2020 0115   HGB 10.6 (L) 09/04/2020 0115   HCT 31.6 (L) 09/04/2020 0115   PLT 227 09/04/2020 0115   MCV 97.5 09/04/2020 0115   MCV 103.5 (A) 05/11/2012 1638   MCH 32.7 09/04/2020 0115   MCHC 33.5 09/04/2020 0115   RDW 14.3 09/04/2020 0115   LYMPHSABS 2.1 01/26/2020 1808   MONOABS 1.0 01/26/2020 1808   EOSABS 0.1 01/26/2020 1808   BASOSABS 0.1 01/26/2020 1808    BMET    Component Value Date/Time   NA 137 09/04/2020 0115   NA 140 04/08/2017 1716   K 3.7 09/04/2020 0115   CL 104 09/04/2020 0115   CO2 26 09/04/2020 0115   GLUCOSE 132 (H) 09/04/2020 0115   BUN 12 09/04/2020 0115   BUN 18 04/08/2017 1716   CREATININE 0.99 09/04/2020 0115   CREATININE 0.84 10/28/2015 1628   CALCIUM 8.8 (L) 09/04/2020 0115   GFRNONAA >60 09/04/2020 0115   GFRNONAA 27 (L) 05/13/2015 1713   GFRAA 79 04/08/2017 1716   GFRAA 31 (L) 05/13/2015 1713    No intake or output data in the 24 hours ending 09/05/20 [place] Scheduled Meds:  atorvastatin  10 mg Oral Daily   busPIRone  5 mg Oral TID   docusate sodium  100 mg Oral Daily   folic acid   1 mg Oral q AM   gabapentin  300 mg Oral BID   gabapentin  600 mg Oral QHS   heparin  5,000 Units Subcutaneous Q8H   hydrochlorothiazide  25 mg Oral q morning   levETIRAcetam  500 mg Oral BID   pantoprazole  40 mg Oral QHS   QUEtiapine  300 mg Oral QHS   [START ON 09/10/2020] Vitamin D (Ergocalciferol)  50,000 Units Oral Q Wed   Continuous Infusions:  sodium chloride     sodium chloride 100 mL/hr at 09/03/20 1840   magnesium sulfate bolus IVPB     PRN Meds:.sodium chloride, acetaminophen **OR** acetaminophen, albuterol, alum & mag hydroxide-simeth, bisacodyl, guaiFENesin-dextromethorphan, hydrALAZINE, HYDROcodone-acetaminophen, HYDROmorphone (DILAUDID) injection, labetalol, loratadine, magnesium sulfate bolus IVPB, metoprolol tartrate, ondansetron, phenol, potassium chloride, senna-docusate  Assessment and Plan: POD 2 right common femoral to below-knee popliteal bypass. VSS and afebrile. RLE well perfused. Continue to work on mobility. Follow-up PT assessment today and likely discharge tomorrow.  -DVT prophylaxis:  heparin [place]   Risa Grill, PA-C [facility] [phone] 09/05/2020  7:37 AM   I have seen and evaluated the patient. I agree with the PA note as documented above.  Postop day 2 status post  right common femoral to below-knee popliteal bypass.  She looks good today and has a palpable AT pulse with brisk Doppler signals in the foot.  PT OT essentially says home health.  She needs to mobilize more in order to be ready for discharge.  Vitals are stable.  She is on statin and has aspirin intolerance.  Maybe discharge this afternoon or this weekend pending her mobility.  [name], MD [facility] [place] Office: [phone]

## 2020-09-05 NOTE — Progress Notes (Signed)
Pt has been ambulating in room from bed to bathroom using rollator with minimal assist.  Pain reasonably controlled with PO pain meds q4h as ordered.  Will cont plan of care.

## 2020-09-05 NOTE — Progress Notes (Signed)
Physical Therapy Treatment Patient Details Name: Heather Horton MRN: [id number] DOB: 1945/11/10 Today's Date: 09/05/2020    History of Present Illness Patient is a 75 year old female admitted 7/6,  s/p Right common femoral to below-knee popliteal artery bypass. PMH includes Hepatitis C, anxiety, arthritis, cervical CA    PT Comments    Pt moving slowly and more painfully this afternoon.  Slower to progress toward goals, but still wishes to go home soon.  Will place her on schedule for therapy tomorrow to get her as mobile as possible.  Emphasis today on warm up, transitions to EOB, scooting, sitting balance, sit to stand and progression of gait with my opinion to change rollator for RW for safety.    Follow Up Recommendations  Home health PT;Supervision/Assistance - 24 hour (pt/family do not want any SNF, they want home and will get the necessary supervision/assist for home.)     Equipment Recommendations  None recommended by PT    Recommendations for Other Services       Precautions / Restrictions Precautions Precautions: Fall    Mobility  Bed Mobility Overal bed mobility: Needs Assistance Bed Mobility: Supine to Sit;Sit to Supine     Supine to sit: Min assist;HOB elevated Sit to supine: Mod assist   General bed mobility comments: min A to bring R LE to edge of bed and for trunk support. cue to push through elbow to sit upright.  More assist of R LE into bed and bridging support for return to good positioning.    Transfers Overall transfer level: Needs assistance Equipment used: 4-wheeled walker;Rolling walker (2 wheeled) Transfers: Sit to/from Stand Sit to Stand: Min assist         General transfer comment: cues for hand placement to/from sitting.  Switched out the rollator for the RW due to not enought AD stability.  Ambulation/Gait Ambulation/Gait assistance: Min assist Gait Distance (Feet): 4 Feet (Forward and back.  Too painful to continue.) Assistive device:  Rolling walker (2 wheeled) Gait Pattern/deviations: Step-to pattern   Gait velocity interpretation: <1.31 ft/sec, indicative of household ambulator General Gait Details: unsteady and antalgic, retropulsive unless pt was stabilized from begind.   Stairs             Wheelchair Mobility    Modified Rankin (Stroke Patients Only)       Balance Overall balance assessment: Needs assistance Sitting-balance support: Feet supported Sitting balance-Leahy Scale: Good     Standing balance support: Bilateral upper extremity supported Standing balance-Leahy Scale: Poor Standing balance comment: reliant on UE support and min A                            Cognition Arousal/Alertness: Awake/alert Behavior During Therapy: WFL for tasks assessed/performed Overall Cognitive Status:  (NT formally)                     Current Attention Level: Sustained   Following Commands: Follows one step commands inconsistently;Follows one step commands with increased time Safety/Judgement: Decreased awareness of safety;Decreased awareness of deficits   Problem Solving: Slow processing;Decreased initiation General Comments: patient appears more clear this AM following directions more appropriately, increased time more related to pain this session than cognition      Exercises Other Exercises Other Exercises: warm up exercise with hip/knee flexion/ext  prior to mobility.    General Comments General comments (skin integrity, edema, etc.): vss on RA      Pertinent  Vitals/Pain Pain Assessment: Faces Faces Pain Scale: Hurts whole lot Pain Location: R LE Pain Descriptors / Indicators: Grimacing;Discomfort;Guarding Pain Intervention(s): Limited activity within patient's tolerance    Home Living Family/patient expects to be discharged to:: Private residence                    Prior Function            PT Goals (current goals can now be found in the care plan  section) Acute Rehab PT Goals Patient Stated Goal: bring patient home with family assist and HH PT Goal Formulation: With patient Time For Goal Achievement: 09/11/20 Potential to Achieve Goals: Good Progress towards PT goals: Progressing toward goals    Frequency    Min 3X/week      PT Plan Current plan remains appropriate    Co-evaluation              AM-PAC PT "6 Clicks" Mobility   Outcome Measure  Help needed turning from your back to your side while in a flat bed without using bedrails?: A Little Help needed moving from lying on your back to sitting on the side of a flat bed without using bedrails?: A Lot Help needed moving to and from a bed to a chair (including a wheelchair)?: A Little Help needed standing up from a chair using your arms (e.g., wheelchair or bedside chair)?: A Little Help needed to walk in hospital room?: A Little Help needed climbing 3-5 steps with a railing? : A Little 6 Click Score: 17    End of Session   Activity Tolerance: Patient limited by pain Patient left: in bed;with call bell/phone within reach;with bed alarm set;with family/visitor present Nurse Communication: Mobility status PT Visit Diagnosis: Unsteadiness on feet (R26.81);Other abnormalities of gait and mobility (R26.89);Difficulty in walking, not elsewhere classified (R26.2);Pain Pain - Right/Left: Right Pain - part of body: Leg     Time: 6004-5997 PT Time Calculation (min) (ACUTE ONLY): 30 min  Charges:  $Therapeutic Exercise: 8-22 mins $Therapeutic Activity: 8-22 mins                     09/05/2020  Heather Carne., PT Acute Rehabilitation Services [phone]  (pager) [phone]  (office)   Heather Horton 09/05/2020, 1:19 PM

## 2020-09-06 NOTE — Progress Notes (Signed)
Physical Therapy Treatment Patient Details Name: Heather Horton MRN: [id number] DOB: April 10, 1945 Today's Date: 09/06/2020    History of Present Illness Patient is a 75 year old female admitted 7/6,  s/p Right common femoral to below-knee popliteal artery bypass. PMH includes Hepatitis C, anxiety, arthritis, cervical CA    PT Comments    Pt progressing well with mobility, ambulatory with rollator for hallway distance. Pt requires multiple standing rest breaks due to RLE pain, and frequent safety cues with use of rollator. PT to continue to follow.     Follow Up Recommendations  Home health PT;Supervision/Assistance - 24 hour (pt/family do not want any SNF, they want home and will get the necessary supervision/assist for home.)     Equipment Recommendations  None recommended by PT    Recommendations for Other Services       Precautions / Restrictions Precautions Precautions: Fall Restrictions Weight Bearing Restrictions: No    Mobility  Bed Mobility Overal bed mobility: Needs Assistance         Sit to supine: Min assist   General bed mobility comments: sitting EOB upon PT arrival, min assist for LE lifting into bed for return to supine.    Transfers Overall transfer level: Needs assistance Equipment used: 4-wheeled walker Transfers: Sit to/from Stand Sit to Stand: Min guard         General transfer comment: for safety, verbal cuing for locking rollator prior to standing and before sitting  Ambulation/Gait Ambulation/Gait assistance: Min guard Gait Distance (Feet): 100 Feet Assistive device: 4-wheeled walker Gait Pattern/deviations: Step-to pattern;Step-through pattern;Decreased stride length;Trunk flexed Gait velocity: decr   General Gait Details: min gaurd for safety, initially very short steps with RLE progressing to more fluid step-through gait with further distance ambulated. Verbal cuing for upright posture   Stairs             Wheelchair  Mobility    Modified Rankin (Stroke Patients Only)       Balance Overall balance assessment: Needs assistance Sitting-balance support: Feet supported Sitting balance-Leahy Scale: Good     Standing balance support: Bilateral upper extremity supported Standing balance-Leahy Scale: Poor Standing balance comment: reliant on UE support and min A                            Cognition Arousal/Alertness: Awake/alert Behavior During Therapy: WFL for tasks assessed/performed Overall Cognitive Status: Within Functional Limits for tasks assessed                                        Exercises General Exercises - Lower Extremity Long Arc Quad: AROM;Left;AAROM;Right;5 reps;Seated    General Comments        Pertinent Vitals/Pain Pain Assessment: Faces Faces Pain Scale: Hurts even more Pain Location: R LE Pain Descriptors / Indicators: Grimacing;Discomfort;Guarding Pain Intervention(s): Limited activity within patient's tolerance;Monitored during session;Repositioned    Home Living                      Prior Function            PT Goals (current goals can now be found in the care plan section) Acute Rehab PT Goals Patient Stated Goal: bring patient home with family assist and HH PT Goal Formulation: With patient Time For Goal Achievement: 09/11/20 Potential to Achieve Goals: Good Progress towards PT goals:  Progressing toward goals    Frequency    Min 3X/week      PT Plan Current plan remains appropriate    Co-evaluation              AM-PAC PT "6 Clicks" Mobility   Outcome Measure  Help needed turning from your back to your side while in a flat bed without using bedrails?: A Little Help needed moving from lying on your back to sitting on the side of a flat bed without using bedrails?: A Little Help needed moving to and from a bed to a chair (including a wheelchair)?: A Little Help needed standing up from a chair using  your arms (e.g., wheelchair or bedside chair)?: A Little Help needed to walk in hospital room?: A Little Help needed climbing 3-5 steps with a railing? : A Little 6 Click Score: 18    End of Session   Activity Tolerance: Patient limited by pain;Patient tolerated treatment well Patient left: in bed;with call bell/phone within reach;with bed alarm set Nurse Communication: Mobility status PT Visit Diagnosis: Unsteadiness on feet (R26.81);Other abnormalities of gait and mobility (R26.89);Difficulty in walking, not elsewhere classified (R26.2);Pain Pain - Right/Left: Right Pain - part of body: Leg     Time: 7026-3785 PT Time Calculation (min) (ACUTE ONLY): 19 min  Charges:  $Gait Training: 8-22 mins                    Stacie Glaze, PT DPT Acute Rehabilitation Services Pager [phone]  Office [phone]    [place] 09/06/2020, 4:42 PM

## 2020-09-06 NOTE — Progress Notes (Signed)
[facility] [place] PROGRESS NOTE  ASSESSMENT / PLAN: Heather Horton is a 75 y.o. female status post left femoral-below knee popliteal bypass with GSV.  PRN pain control PT/OT/OOB/Ambulate ASA / Statin Doesn't feel quite ready for DC yet - will give her another day  SUBJECTIVE: Left foot feels great. Has only walked around the room. Not feeling confident to go home.  OBJECTIVE: BP 124/60 (BP Location: Right Arm)   Pulse 91   Temp 99.2 F (37.3 C) (Axillary)   Resp 20   Ht 5\' 5"  (1.651 m)   Wt 81.6 kg   SpO2 97%   BMI 29.95 kg/m   Intake/Output Summary (Last 24 hours) at 09/06/2020 1045 Last data filed at 09/05/2020 2000 Gross per 24 hour  Intake 240 ml  Output --  Net 240 ml    Constitutional: well appearing. No acute distress. Cardiac: RRR. Pulmonary: unlabored Abdomen: soft Vascular: Left leg warm. Strong DS in PT. Incisions clean and dry.  CBC Latest Ref Rng & Units 09/04/2020 09/03/2020 08/28/2020  WBC 4.0 - 10.5 K/uL 6.4 9.9 8.2  Hemoglobin 12.0 - 15.0 g/dL 10.6(L) 11.7(L) 12.4  Hematocrit 36.0 - 46.0 % 31.6(L) 35.0(L) 37.3  Platelets 150 - 400 K/uL 227 302 312     CMP Latest Ref Rng & Units 09/04/2020 09/03/2020 08/28/2020  Glucose 70 - 99 mg/dL 132(H) - 99  BUN 8 - 23 mg/dL 12 - 14  Creatinine 0.44 - 1.00 mg/dL 0.99 0.73 0.66  Sodium 135 - 145 mmol/L 137 - 134(L)  Potassium 3.5 - 5.1 mmol/L 3.7 - 3.4(L)  Chloride 98 - 111 mmol/L 104 - 101  CO2 22 - 32 mmol/L 26 - 21(L)  Calcium 8.9 - 10.3 mg/dL 8.8(L) - 9.6  Total Protein 6.5 - 8.1 g/dL 6.4(L) - 7.5  Total Bilirubin 0.3 - 1.2 mg/dL 0.6 - 0.7  Alkaline Phos 38 - 126 U/L 50 - 66  AST 15 - 41 U/L 21 - 22  ALT 0 - 44 U/L 11 - 21    Estimated Creatinine Clearance: 53.4 mL/min (by C-G formula based on SCr of 0.99 mg/dL).  Yevonne Aline. Stanford Breed, MD [facility] Phone Number: [phone] 09/06/2020 10:45 AM

## 2020-09-06 NOTE — Progress Notes (Signed)
Mobility Specialist: Progress Note   09/06/20 1210  Mobility  Activity Ambulated in hall  Level of Assistance Contact guard assist, steadying assist  Assistive Device Four wheel walker  Distance Ambulated (ft) 100 ft  Mobility Ambulated with assistance in hallway  Mobility Response Tolerated well  Mobility performed by Mobility specialist  Bed Position Chair  $Mobility charge 1 Mobility   Pre-Mobility: 90 HR Post-Mobility: 99 HR, 143/65 BP, 97% SpO2  Pt independent to stand and contact guard during ambulation. Pt c/o 7-8/10 pain during ambulation in her RLE at incision sites. Pt required verbal cues to keep her hands on the rollator during ambulation as she would stop and put one hand on the railing and leave one hand on the rollator. Pt back to the recliner with call bell in her lap and pt's daughter present in the room.   [facility] Galo Sayed Mobility Specialist Mobility Specialist Phone: [phone]

## 2020-09-06 NOTE — Progress Notes (Signed)
Occupational Therapy Treatment Patient Details Name: Heather Horton MRN: [id number] DOB: 01-25-1946 Today's Date: 09/06/2020    History of present illness Patient is a 75 year old female admitted 7/6,  s/p Right common femoral to below-knee popliteal artery bypass. PMH includes Hepatitis C, anxiety, arthritis, cervical CA   OT comments  Pt. Progressing well with skilled OT.  Bed mobility S.  Standing task at sink approx. 5 min. With min guard a.  Reports good family support at home and son purchasing additional DME for her ie: bed with elevation controls and possible lift chair.    Follow Up Recommendations  Home health OT;Supervision/Assistance - 24 hour    Equipment Recommendations  None recommended by OT    Recommendations for Other Services      Precautions / Restrictions Precautions Precautions: Fall Restrictions Weight Bearing Restrictions: No       Mobility Bed Mobility Overal bed mobility: Needs Assistance Bed Mobility: Supine to Sit;Sit to Supine     Supine to sit: Supervision;HOB elevated Sit to supine: Supervision   General bed mobility comments: hob elevated for out of bed as pt. states she sleeps in a recliner.  hob lowered for back to bed and pt. able to bring b les into bed without assistance    Transfers Overall transfer level: Needs assistance Equipment used: 4-wheeled walker Transfers: Sit to/from [facility] Sit to Stand: Min guard Stand pivot transfers: Min guard            Balance                                           ADL either performed or assessed with clinical judgement   ADL Overall ADL's : Needs assistance/impaired     Grooming: Wash/dry hands;Wash/dry face;Oral care;Standing;Min guard Grooming Details (indicate cue type and reason): cues for weight bearing and standing balance at sink                 Toilet Transfer: Min guard;Cueing for sequencing Copy) Armed forces technical officer Details  (indicate cue type and reason): simulated with in room ambulation from eob to sink back to eob. cues to slide RLE forward before sitting down         Functional mobility during ADLs: Min guard;Cueing for sequencing (rolator) General ADL Comments: improved mobility this session however patient still limiting weight bearing in R leg due to pain     Vision       Perception     Praxis      Cognition Arousal/Alertness: Awake/alert Behavior During Therapy: WFL for tasks assessed/performed Overall Cognitive Status: Within Functional Limits for tasks assessed                                          Exercises     Shoulder Instructions       General Comments  Retired Charity fundraiser, taught phlebotomy too    Pertinent Vitals/ Pain       Pain Assessment: Faces Faces Pain Scale: Hurts a little bit Pain Location: R LE Pain Descriptors / Indicators: Grimacing;Discomfort;Guarding Pain Intervention(s): Limited activity within patient's tolerance;Monitored during session;Premedicated before session;Repositioned  Home Living  Prior Functioning/Environment              Frequency  Min 2X/week        Progress Toward Goals  OT Goals(current goals can now be found in the care plan section)  Progress towards OT goals: Progressing toward goals     Plan Discharge plan remains appropriate    Co-evaluation                 AM-PAC OT "6 Clicks" Daily Activity     Outcome Measure   Help from another person eating meals?: None Help from another person taking care of personal grooming?: A Little Help from another person toileting, which includes using toliet, bedpan, or urinal?: A Lot Help from another person bathing (including washing, rinsing, drying)?: A Lot Help from another person to put on and taking off regular upper body clothing?: A Little Help from another person to put on and taking off  regular lower body clothing?: A Lot 6 Click Score: 16    End of Session Equipment Utilized During Treatment: Other (comment) (rolator)  OT Visit Diagnosis: Unsteadiness on feet (R26.81);Other abnormalities of gait and mobility (R26.89);Pain Pain - Right/Left: Right Pain - part of body: Leg   Activity Tolerance Patient tolerated treatment well   Patient Left in bed;with call bell/phone within reach;with bed alarm set   Nurse Communication          Time: 8889-1694 OT Time Calculation (min): 21 min  Charges: OT General Charges $OT Visit: 1 Visit OT Treatments $Self Care/Home Management : 8-22 mins  Sonia Baller, COTA/L Acute Rehabilitation [phone]    Tanya Nones 09/06/2020, 10:45 AM

## 2020-09-07 MED ORDER — ATORVASTATIN CALCIUM 40 MG PO TABS: 40.0000 mg | ORAL_TABLET | Freq: Every day | ORAL | 11 refills | Status: DC

## 2020-09-07 MED ORDER — HYDROCODONE-ACETAMINOPHEN 5-325 MG PO TABS: 1.0000 | ORAL_TABLET | ORAL | 0 refills | Status: DC | PRN

## 2020-09-07 NOTE — TOC Initial Note (Signed)
Transition of Care (TOC) - Initial/Assessment Note  Call to patient to discuss discharge plan. No answer to hospital room. Call to daughter- Luan Pulling- [phone] to discuss recommendation for home health PT and OT. Ms. Braulio Conte is at the bedside. Daughter is in agreement with the plan and requests [facility] furnish services as they have in the past. She shares that patient lives alone but, she is their daily to assist her mother with ADLs. Ms. Braulio Conte also shares she will transport patient home upon discharge. Call to [facility] [place]- [phone]. Placed referral for PT and OT services. Will continue to monitor for discharge needs.  Patient Details  Name: Heather Horton MRN: [id number] Date of Birth: 31-Jan-1946  Transition of Care [facility]) CM/SW Contact:    Maebelle Munroe, RN Phone Number: 09/07/2020, 10:09 AM  Clinical Narrative:                   Expected Discharge Plan: [place] Barriers to Discharge:  (Continued medical work up for discharge.)   Patient Goals and CMS Choice Patient states their goals for this hospitalization and ongoing recovery are:: Family shares- Patient to return home. CMS Medicare.gov Compare Post Acute Care list provided to:: Patient Represenative (must comment) (Daughter- Luan Pulling.) Choice offered to / list presented to : Adult Children  Expected Discharge Plan and Services Expected Discharge Plan: [place] In-house Referral: Clinical Social Work Discharge Planning Services: CM Consult Post Acute Care Choice: [place] arrangements for the past 2 months: [place] Expected Discharge Date: 09/07/20               DME Arranged: N/A         HH Arranged: OT, PT HH Agency: [facility] Date HH Agency Contacted: 09/07/20 Time HH Agency Contacted: 1000 Representative spoke with at [place]: Left message for [place]  Prior Living Arrangements/Services Living  arrangements for the past 2 months: [place] Lives with:: Self Patient language and need for interpreter reviewed:: No Do you feel safe going back to the place where you live?: Yes      Need for Family Participation in Patient Care: Yes (Comment) ([place] post discharge care for patient.) Care giver support system in place?: Yes (comment)   Criminal Activity/Legal Involvement Pertinent to Current Situation/Hospitalization: No - Comment as needed  Activities of Daily Living Home Assistive Devices/Equipment: Environmental consultant (specify type) (rollator) ADL Screening (condition at time of admission) Patient's cognitive ability adequate to safely complete daily activities?: Yes Is the patient deaf or have difficulty hearing?: No Does the patient have difficulty seeing, even when wearing glasses/contacts?: No Does the patient have difficulty concentrating, remembering, or making decisions?: No Patient able to express need for assistance with ADLs?: Yes Does the patient have difficulty dressing or bathing?: No Independently performs ADLs?: Yes (appropriate for developmental age) Does the patient have difficulty walking or climbing stairs?: No Weakness of Legs: None Weakness of Arms/Hands: None  Permission Sought/Granted                  Emotional Assessment           Psych Involvement: No (comment)  Admission diagnosis:  PAD (peripheral artery disease) ([place]) [I73.9] Patient Active Problem List   Diagnosis Date Noted   PAD (peripheral artery disease) ([place]) 19/62/2297   Gastritis, Helicobacter pylori 98/92/1194   Malnutrition of moderate degree 01/28/2020   Esophageal dysphagia 01/27/2020   Unintentional weight loss of  more than 10 pounds 01/27/2020   Temporal lobe epilepsy ([place]) 01/27/2020   Aortic atherosclerosis ([place]) 01/27/2020   AKI (acute kidney injury) ([place]) 01/26/2020   History of cervical cancer 04/29/2017   Generalized anxiety disorder 07/22/2015   Liver fibrosis  04/17/2015   Family history of colon cancer 12/17/2014   History of colonic polyps 12/17/2014   Fibromyalgia 11/28/2014   Osteoarthritis of multiple joints 10/17/2014   Esophageal reflux 10/17/2014   Benign essential HTN 10/17/2014   Smoker 10/17/2014   Chronic constipation    Fibrocystic breast disease    PCP:  [name], PA Pharmacy:   [id number] - [place], [place] [address] Phone: [phone] Fax: [phone]     Social Determinants of Health (SDOH) Interventions    Readmission Risk Interventions No flowsheet data found.

## 2020-09-07 NOTE — Discharge Summary (Addendum)
[facility] Discharge Summary   Patient ID:  Heather Horton MRN: [id number] DOB/AGE: 1946/01/21 75 y.o.  Admit date: 09/03/2020 Discharge date: 09/07/2020 Attending Surgeon: [name] Admission Diagnosis: PAD (peripheral artery disease) [facility]) [I73.9]  Discharge Diagnoses:  PAD (peripheral artery disease) ([place]) [I73.9]  Secondary Diagnoses: Past Medical History:  Diagnosis Date   Acute encephalopathy    Allergy    Anemia    Anxiety    Arthritis    Asthma    Blood transfusion without reported diagnosis    Bronchitis    Bursitis    Cervical cancer ([place])    Chronic constipation    Chronic hepatitis C without hepatic coma ([place]) 10/17/2014   11/10/2015 visit with Dr. Linus Salmons. Repeat labs revealed resolution of infection following treatment.  [facility], Florentina Addison, [place], FNP-C; last seen 01/2014 Genotype 1b Diagnosed 01/2013   Chronic kidney disease    stage 3   Colon polyps    Family history of adverse reaction to anesthesia    daughter post-op nausea and vomiting   Fibrocystic breast disease    Fibromyalgia    diagnosed years ago per pt   Galactorrhea on right side    GERD (gastroesophageal reflux disease)    Headache    migraines in the past   Hepatitis C    Hyperlipidemia    Hypertension    Insomnia    Neuromuscular disorder ([place])    Osteoporosis    Respiratory failure ([place]) 03/20/2017   Seizures ([place])     Procedures: 09/03/2020 Procedure(s): BYPASS GRAFT FEMORAL-POPLITEAL ARTERY USING NON-REVERSED GREATER SAPHENOUS VEIN RIGHT  Discharged Condition: good  HPI: Heather Horton is a 75 y.o. female, with history of anxiety, hypertension, hyperlipidemia, hepatitis C, stage III CKD that presents for evaluation of lower extremity swelling and right leg pain.  She states this has been an ongoing issue for the last year at least.  She does walk with a walker.  She states her right foot hurts her throughout the day and into the  night at times.  Pain is into the bottom of the foot.  She denies any active tissue loss at this time.  In addition she complains about a fair amount of swelling and a varicosity on the right medial calf.  She did have home health screening that was evidence of PAD in the right leg.  She denies any previous lower extremity interventions.  Does smoke a pack a day.    Hospital Course:  Heather Horton is a 75 y.o. female is 4 Days Post-Op Procedure(s): BYPASS GRAFT FEMORAL-POPLITEAL ARTERY USING NON-REVERSED GREATER SAPHENOUS VEIN RIGHT   Extubated: POD # 0 Post-op wounds healing well Pt. Ambulating, voiding and taking PO diet without difficulty. Pt pain controlled with PO pain meds. Labs as below Complications:none  Consults:  none  Significant Diagnostic Studies: CBC    Component Value Date/Time   WBC 6.4 09/04/2020 0115   RBC 3.24 (L) 09/04/2020 0115   HGB 10.6 (L) 09/04/2020 0115   HCT 31.6 (L) 09/04/2020 0115   PLT 227 09/04/2020 0115   MCV 97.5 09/04/2020 0115   MCV 103.5 (A) 05/11/2012 1638   MCH 32.7 09/04/2020 0115   MCHC 33.5 09/04/2020 0115   RDW 14.3 09/04/2020 0115   LYMPHSABS 2.1 01/26/2020 1808   MONOABS 1.0 01/26/2020 1808   EOSABS 0.1 01/26/2020 1808   BASOSABS 0.1 01/26/2020 1808    BMET    Component Value Date/Time   NA 137 09/04/2020 0115  NA 140 04/08/2017 1716   K 3.7 09/04/2020 0115   CL 104 09/04/2020 0115   CO2 26 09/04/2020 0115   GLUCOSE 132 (H) 09/04/2020 0115   BUN 12 09/04/2020 0115   BUN 18 04/08/2017 1716   CREATININE 0.99 09/04/2020 0115   CREATININE 0.84 10/28/2015 1628   CALCIUM 8.8 (L) 09/04/2020 0115   GFRNONAA >60 09/04/2020 0115   GFRNONAA 27 (L) 05/13/2015 1713   GFRAA 79 04/08/2017 1716   GFRAA 31 (L) 05/13/2015 1713    COAG estimated creatinine clearance is 53.4 mL/min (by C-G formula based on SCr of 0.99 mg/dL).  No results found for: PTT  Disposition:  Discharge to :Home Discharge Instructions     Call MD for:   redness, tenderness, or signs of infection (pain, swelling, bleeding, redness, odor or green/yellow discharge around incision site)   Complete by: As directed    Call MD for:  severe or increased pain, loss or decreased feeling  in affected limb(s)   Complete by: As directed    Call MD for:  temperature >100.5   Complete by: As directed    Driving Restrictions   Complete by: As directed    No driving while taking narcotic pain medicine. Only drive when you feel confident working the pedals with your feet.   Lifting restrictions   Complete by: As directed    No heavy lifting for 2 weeks   Resume previous diet   Complete by: As directed       Allergies as of 09/07/2020       Reactions   Molds & Smuts Other (See Comments)   Per allergy test   Pollen Extract Other (See Comments)   Per allergy test   Aspirin Nausea And Vomiting, Rash        Medication List     TAKE these medications    albuterol 108 (90 Base) MCG/ACT inhaler Commonly known as: VENTOLIN HFA Inhale 2 puffs into the lungs every 6 (six) hours as needed for wheezing.   atorvastatin 40 MG tablet Commonly known as: LIPITOR Take 1 tablet (40 mg total) by mouth daily. Start taking on: September 08, 2020 What changed:  medication strength how much to take   busPIRone 5 MG tablet Commonly known as: BUSPAR Take 5 mg by mouth 3 (three) times daily.   ergocalciferol 1.25 MG (50000 UT) capsule Commonly known as: VITAMIN D2 Take 50,000 Units by mouth every Wednesday.   folic acid 768 MCG tablet Commonly known as: FOLVITE Take 800 mcg by mouth in the morning.   gabapentin 300 MG capsule Commonly known as: NEURONTIN Take 300-600 mg by mouth See admin instructions. Take 300 mg morning and noon and 600 mg at bedtime   hydrochlorothiazide 25 MG tablet Commonly known as: HYDRODIURIL Take 25 mg by mouth every morning.   HYDROcodone-acetaminophen 10-325 MG tablet Commonly known as: NORCO Take 1 tablet by mouth every  8 (eight) hours as needed for moderate pain or severe pain.   ketoconazole 2 % cream Commonly known as: NIZORAL Apply 1 application topically daily as needed for irritation.   loratadine 10 MG tablet Commonly known as: CLARITIN Take 10 mg by mouth daily as needed for allergies or rhinitis.   pantoprazole 40 MG tablet Commonly known as: PROTONIX Take 40 mg by mouth at bedtime.   QUEtiapine 300 MG tablet Commonly known as: SEROQUEL Take 1 tablet (300 mg total) by mouth at bedtime.   Visine-AC 0.05-0.25 % ophthalmic solution Generic drug:  tetrahydrozoline-zinc Place 1-2 drops into both eyes 3 (three) times daily as needed (for irritation).       ASK your doctor about these medications    diclofenac Sodium 1 % Gel Commonly known as: VOLTAREN Apply 2 g topically 4 (four) times daily.   levETIRAcetam 500 MG tablet Commonly known as: KEPPRA TAKE 1 TABLET BY MOUTH TWICE A DAY         Signed: Cherre Robins 09/07/2020, 10:03 AM

## 2020-09-07 NOTE — Discharge Instructions (Signed)
 [facility] [place]  Discharge instructions  Lower Extremity Bypass Surgery  Please refer to the following instruction for your post-procedure care. Your surgeon or physician assistant will discuss any changes with you.  Activity  You are encouraged to walk as much as you can. You can slowly return to normal activities during the month after your surgery. Avoid strenuous activity and heavy lifting until your doctor tells you it's OK. Avoid activities such as vacuuming or swinging a golf club. Do not drive until your doctor give the OK and you are no longer taking prescription pain medications. It is also normal to have difficulty with sleep habits, eating and bowel movement after surgery. These will go away with time.  Bathing/Showering  You may shower after you go home. Do not soak in a bathtub, hot tub, or swim until the incision heals completely.  Incision Care  Clean your incision with mild soap and water. Shower every day. Pat the area dry with a clean towel. You do not need a bandage unless otherwise instructed. Do not apply any ointments or creams to your incision. If you have open wounds you will be instructed how to care for them or a visiting nurse may be arranged for you. If you have staples or sutures along your incision they will be removed at your post-op appointment. You may have skin glue on your incision. Do not peel it off. It will come off on its own in about one week. If you have a great deal of moisture in your groin, use a gauze help keep this area dry.  Diet  Resume your normal diet. There are no special food restrictions following this procedure. A low fat/ low cholesterol diet is recommended for all patients with vascular disease. In order to heal from your surgery, it is CRITICAL to get adequate nutrition. Your body requires vitamins, minerals, and protein. Vegetables are the best source of vitamins and minerals. Vegetables also provide the  perfect balance of protein. Processed food has little nutritional value, so try to avoid this.  Medications  Resume taking all your medications unless your doctor or nurse practitioner tells you not to. If your incision is causing pain, you may take over-the-counter pain relievers such as acetaminophen (Tylenol). If you were prescribed a stronger pain medication, please aware these medication can cause nausea and constipation. Prevent nausea by taking the medication with a snack or meal. Avoid constipation by drinking plenty of fluids and eating foods with high amount of fiber, such as fruits, vegetables, and grains. Take Colase 100 mg (an over-the-counter stool softener) twice a day as needed for constipation. Do not take Tylenol if you are taking prescription pain medications.  Follow Up  Our office will schedule a follow up appointment 2-3 weeks following discharge.  Please call us immediately for any of the following conditions  Severe or worsening pain in your legs or feet while at rest or while walking Increase pain, redness, warmth, or drainage (pus) from your incision site(s) Fever of 101 degree or higher The swelling in your leg with the bypass suddenly worsens and becomes more painful than when you were in the hospital If you have been instructed to feel your graft pulse then you should do so every day. If you can no longer feel this pulse, call the office immediately. Not all patients are given this instruction.  Leg swelling is common after leg bypass surgery.  The swelling should improve over a few months   following surgery. To improve the swelling, you may elevate your legs above the level of your heart while you are sitting or resting. Your surgeon or physician assistant may ask you to apply an ACE wrap or wear compression (TED) stockings to help to reduce swelling.  Reduce your risk of vascular disease  Stop smoking. If you would like help call [facility] at 1-800-QUIT-NOW  (1-800-784-8669) or [place] at 336-586-4000.  Manage your cholesterol Maintain a desired weight Control your diabetes weight Control your diabetes Keep your blood pressure down  If you have any questions, please call the office at [phone]   

## 2020-09-07 NOTE — Progress Notes (Signed)
Discharge instructions provided to patient. Medications, incicion care and follow-up reviewed. All questions answered. IV removed. Patient to be escorted home by her daughter.  Gailen Shelter RN

## 2020-09-08 ENCOUNTER — Telehealth: Payer: Self-pay

## 2020-09-08 NOTE — Telephone Encounter (Signed)
Patient's daughter calls today to report swelling in patient's leg s/p bypass on 09/03/20. Advised her the swelling was normal as long as she is able to move her leg. She is reassured. We discussed signs symptoms of infection and to watch for severe pain in the foot. She verbalizes understanding.

## 2020-09-25 ENCOUNTER — Other Ambulatory Visit: Payer: Self-pay

## 2020-09-25 DIAGNOSIS — I739 Peripheral vascular disease, unspecified: Secondary | ICD-10-CM

## 2020-09-30 ENCOUNTER — Ambulatory Visit (INDEPENDENT_AMBULATORY_CARE_PROVIDER_SITE_OTHER): Payer: Medicare HMO | Admitting: Physician Assistant

## 2020-09-30 ENCOUNTER — Ambulatory Visit (HOSPITAL_COMMUNITY)
Admission: RE | Admit: 2020-09-30 | Discharge: 2020-09-30 | Disposition: A | Discharge status: Home / Self Care | Payer: Medicare HMO | Source: Ambulatory Visit | Attending: Vascular Surgery | Admitting: Vascular Surgery

## 2020-09-30 ENCOUNTER — Other Ambulatory Visit: Payer: Self-pay

## 2020-09-30 ENCOUNTER — Ambulatory Visit (INDEPENDENT_AMBULATORY_CARE_PROVIDER_SITE_OTHER)
Admission: RE | Admit: 2020-09-30 | Discharge: 2020-09-30 | Disposition: A | Discharge status: Home / Self Care | Payer: Medicare HMO | Source: Ambulatory Visit | Attending: Vascular Surgery | Admitting: Vascular Surgery

## 2020-09-30 VITALS — BP 148/79 | HR 82 | Temp 97.0°F | Ht 65.0 in | Wt 183.5 lb

## 2020-09-30 DIAGNOSIS — I739 Peripheral vascular disease, unspecified: Secondary | ICD-10-CM | POA: Diagnosis present

## 2020-09-30 NOTE — Progress Notes (Signed)
POST OPERATIVE OFFICE NOTE    CC:  F/u for surgery  HPI:  This is a 75 y.o. female who is s/p harvest of right greater saphenous vein, right common femoral to below knee popliteal artery bypass with non reversed greater saphenous vein by Dr. [name] on 09/03/20. This was performed due to RLE rest pain. She did well post operatively and was discharge POD #4. She has been receiving Home Health PT. She reports that pain on bottom of right foot is resolved. She is still having tenderness along right shin with some intermittent sharp pains. She is taking Gabapentin for nerve pain. She otherwise is doing well. Her incisions are healing well. She reports no rest pain, claudication or tissue loss of LLE. She is on statin. Does not tolerate Aspirin due to n/v and rash  Allergies  Allergen Reactions   Molds & Smuts Other (See Comments)    Per allergy test   Pollen Extract Other (See Comments)    Per allergy test   Aspirin Nausea And Vomiting and Rash    Current Outpatient Medications  Medication Sig Dispense Refill   albuterol (PROVENTIL HFA;VENTOLIN HFA) 108 (90 Base) MCG/ACT inhaler Inhale 2 puffs into the lungs every 6 (six) hours as needed for wheezing. 1 Inhaler 1   atorvastatin (LIPITOR) 40 MG tablet Take 1 tablet (40 mg total) by mouth daily. 30 tablet 11   busPIRone (BUSPAR) 5 MG tablet Take 5 mg by mouth 3 (three) times daily.     diclofenac Sodium (VOLTAREN) 1 % GEL Apply 2 g topically 4 (four) times daily. (Patient taking differently: Apply 2 g topically daily as needed (Pain).) 50 g 1   ergocalciferol (VITAMIN D2) 1.25 MG (50000 UT) capsule Take 50,000 Units by mouth every Wednesday.      folic acid (FOLVITE) [id number] MCG tablet Take 800 mcg by mouth in the morning.     gabapentin (NEURONTIN) 300 MG capsule Take 300-600 mg by mouth See admin instructions. Take 300 mg morning and noon and 600 mg at bedtime     hydrochlorothiazide (HYDRODIURIL) 25 MG tablet Take 25 mg by mouth every morning.      HYDROcodone-acetaminophen (NORCO) 10-325 MG tablet Take 1 tablet by mouth every 8 (eight) hours as needed for moderate pain or severe pain.     ketoconazole (NIZORAL) 2 % cream Apply 1 application topically daily as needed for irritation.     levETIRAcetam (KEPPRA) 500 MG tablet TAKE 1 TABLET BY MOUTH TWICE A DAY (Patient taking differently: Take 500 mg by mouth 2 (two) times daily.) 120 tablet 0   loratadine (CLARITIN) 10 MG tablet Take 10 mg by mouth daily as needed for allergies or rhinitis.     pantoprazole (PROTONIX) 40 MG tablet Take 40 mg by mouth at bedtime.     QUEtiapine (SEROQUEL) 300 MG tablet Take 1 tablet (300 mg total) by mouth at bedtime. 90 tablet 3   tetrahydrozoline-zinc (VISINE-AC) 0.05-0.25 % ophthalmic solution Place 1-2 drops into both eyes 3 (three) times daily as needed (for irritation).      No current facility-administered medications for this visit.     ROS:  See HPI  Physical Exam:  Vitals:   09/30/20 1452  BP: (!) 148/79  Pulse: 82  Temp: (!) 97 F (36.1 C)  TempSrc: Oral  SpO2: 98%  Weight: 183 lb 8 oz (83.2 kg)  Height: '5\' 5"'$  (1.651 m)   General: well appearing, well nourished, not in any distress Incision:  right groin,  saphenectomy sites and bk popliteal incisions are all well appearing and healing nicely. Dry gauze placed in right groin Extremities:  well perfused and warm. Mild edema around right ankle. 2+ femoral pulse. Unable to palpable distal pulses.  Neuro: alert and oriented Abdomen:  obese, soft, non tender  Non invasive vascular lab: +-------+-----------+-----------+------------+------------+  ABI/TBIToday's ABIToday's TBIPrevious ABIPrevious TBI  +-------+-----------+-----------+------------+------------+  Right  0.87       0.43       0.68        0.19          +-------+-----------+-----------+------------+------------+  Left   0.62       0.48       1.01        0.7            +-------+-----------+-----------+------------+------------+  LT Great toe pressure = 91 mmHg.  RT great toe pressure = 80 mmHg.   +-----------+--------+-----+--------+----------+--------+  RIGHT      PSV cm/sRatioStenosisWaveform  Comments  +-----------+--------+-----+--------+----------+--------+  ATA Distal 101                  monophasic          +-----------+--------+-----+--------+----------+--------+  PTA Distal 18                   monophasic          +-----------+--------+-----+--------+----------+--------+  PERO Distal51                   monophasic          +-----------+--------+-----+--------+----------+--------+     Right Graft #1: femoral to BK popliteal  +------------------+--------+--------+----------+--------+                    PSV cm/sStenosisWaveform  Comments  +------------------+--------+--------+----------+--------+  Inflow            93              biphasic            +------------------+--------+--------+----------+--------+  Prox Anastomosis  94              biphasic            +------------------+--------+--------+----------+--------+  Proximal Graft    113             triphasic           +------------------+--------+--------+----------+--------+  Mid Graft         61              monophasic          +------------------+--------+--------+----------+--------+  Distal Graft      80              biphasic            +------------------+--------+--------+----------+--------+  Distal Anastomosis156             monophasic          +------------------+--------+--------+----------+--------+  Outflow           75              monophasic          +------------------+--------+--------+----------+--------+   Right: Patent right fem-BK popliteal bypass graft with no stenosis identified.   Assessment/Plan:  This is a 75 y.o. female who is s/p: harvest of right greater saphenous vein, right  common femoral to below knee popliteal artery bypass with non reversed greater saphenous vein by Dr. [name] on 09/03/20. Her duplex today shows patent RLE  bypass. Her ABIs are improved on the right lower extremity post operatively. Her left ABIs had decreased slightly but she is not having any claudication, rest pain or tissue loss. Her RLE incisions are healing very well. Gauze to right groin until incision is well healed. She otherwise will continue to clean incisions with mild soap and water. - She will continue her HH PT - Continue Statin - She will follow up in 6 months with repeat RLE bypass graft duplex and ABIs   Heather Caldwell, PA-C [facility] [phone]  Clinic MD:  Roxanne Mins

## 2020-10-01 ENCOUNTER — Other Ambulatory Visit: Payer: Self-pay

## 2020-10-01 DIAGNOSIS — I739 Peripheral vascular disease, unspecified: Secondary | ICD-10-CM

## 2020-11-12 ENCOUNTER — Emergency Department (HOSPITAL_COMMUNITY)
Admission: EM | Admit: 2020-11-12 | Discharge: 2020-11-13 | Disposition: A | Discharge status: Home / Self Care | Payer: Medicare HMO | Attending: Emergency Medicine | Admitting: Emergency Medicine

## 2020-11-12 ENCOUNTER — Emergency Department (HOSPITAL_COMMUNITY): Payer: Medicare HMO

## 2020-11-12 ENCOUNTER — Encounter (HOSPITAL_COMMUNITY): Payer: Self-pay | Admitting: Emergency Medicine

## 2020-11-12 ENCOUNTER — Other Ambulatory Visit: Payer: Self-pay

## 2020-11-12 DIAGNOSIS — M25562 Pain in left knee: Secondary | ICD-10-CM | POA: Diagnosis present

## 2020-11-12 DIAGNOSIS — Z5321 Procedure and treatment not carried out due to patient leaving prior to being seen by health care provider: Secondary | ICD-10-CM | POA: Diagnosis not present

## 2020-11-12 NOTE — ED Triage Notes (Signed)
Pt c/o left knee pain that started this morning. Mild swelling noted, denies fall/trauma.

## 2020-11-12 NOTE — ED Provider Notes (Signed)
Emergency Medicine Provider Triage Evaluation Note  Heather Horton , a 75 y.o. female  was evaluated in triage.  Pt complains of left knee pain.  Patient reports that pain started this morning after waking.  Patient reports that she has had difficulty ambulating due to pain.  Review of Systems  Positive: Arthralgia Negative: Numbness, weakness  Physical Exam  BP 136/77 (BP Location: Right Arm)   Pulse 93   Temp 97.9 F (36.6 C) (Oral)   Resp 18   SpO2 99%  Gen:   Awake, no distress   Resp:  Normal effort  MSK:   Moves extremities without difficulty, patient has tenderness to the left knee.  Minimal swelling noted to lateral aspect of left knee.  No erythema noted. Other:  +1 left DP pulse.  Medical Decision Making  Medically screening exam initiated at 3:58 PM.  Appropriate orders placed.  Heather Horton was informed that the remainder of the evaluation will be completed by another provider, this initial triage assessment does not replace that evaluation, and the importance of remaining in the ED until their evaluation is complete.  The patient appears stable so that the remainder of the work up may be completed by another provider.      Loni Beckwith, PA-C 11/12/20 1600    Elnora Morrison, MD 11/13/20 [id number]

## 2020-11-13 NOTE — ED Notes (Signed)
Patient called x3 for vitals recheck with no response and not visible in lobby

## 2020-11-19 ENCOUNTER — Ambulatory Visit: Payer: Medicare HMO | Admitting: Neurology

## 2020-11-19 ENCOUNTER — Other Ambulatory Visit: Payer: Self-pay

## 2020-11-19 ENCOUNTER — Encounter: Payer: Self-pay | Admitting: Neurology

## 2020-11-19 VITALS — BP 157/81 | HR 83 | Ht 65.0 in | Wt 188.2 lb

## 2020-11-19 DIAGNOSIS — G40009 Localization-related (focal) (partial) idiopathic epilepsy and epileptic syndromes with seizures of localized onset, not intractable, without status epilepticus: Secondary | ICD-10-CM

## 2020-11-19 DIAGNOSIS — R413 Other amnesia: Secondary | ICD-10-CM

## 2020-11-19 NOTE — Progress Notes (Signed)
NEUROLOGY FOLLOW UP OFFICE NOTE  [name] [id number] [birth year]  HISTORY OF PRESENT ILLNESS: I had the pleasure of seeing Heather Horton in follow-up in the neurology clinic on 11/19/2020. She is again accompanied by her daughter [name] who helps supplement the history today.  The patient was last seen 10 months ago due to concerns that her cognitive changes were causing her inability to eat and weight loss.  MOCA score in 12/2019 was 13/30, however we discussed that her symptoms were not suggestive of dementia as cause. I personally reviewed MRI brain without contrast done 12/2019, patient was unable to complete the study with motion degradation, but there were no acute changes seen with mild diffuse atrophy. She was then admitted to the hospital and found to have distal esophageal stenosis and chronic gastritis with H. Pylori. With treatment, all her symptoms have improved, she is eating better (and upset about weight gain now). Her daughter reports she is cognitively improved as well. [name] administers her medications daily and manages finances. She states if she did her medications, she gets a little confused. She does not drive.She makes sandwiches and does not use the stove. She needs help with getting into the shower but can dress herself. They both deny any seizures since 2019 on Levetiracetam 500mg  BID. They deny any staring/unresponsive episodes, gaps in time, olfactory/gustatory hallucinations. Sometimes her hands may jerk. She has left carpal tunnel syndrome and has had injections in her wrist. She feels her left hand is still weaker and uses a brace. She feels tired. She is on Seroquel 300mg  qhs for sleep but gets up several times at night to urinate. Sometimes she cannot go back to sleep. She denies taking naps but [name] says she naps sometimes. She reports dizziness when standing, no falls. She is doing physical therapy after right femoropopliteal bypass 75. She uses a walker to ambulate,  right leg feels sore.    History on Initial Assessment 06/28/2017: This is a 75 year old right-handed woman with a history of hypertension, hyperlipidemia, hepatitis C, cervical cancer, presenting for evaluation of possible seizure. She was admitted to [facility] last 03/20/2017 for altered mental status. She lives alone and recalls getting ready for bed feeling fine, then waking up in the hospital. Her daughter visits her daily except on weekends, she had seen her Friday and spoke to her on the phone the day prior. Her daughter came Sunday noon and found her naked on the floor. She had fallen into a table in the den and daughter found her moaning with eyes rolling back, her right leg and arm were "flinching." They found her bed clothes folded on top of the bed. No tongue bite or incontinence. Her daughter feels she was down for a long time because her lips were so dry. She was brought to [facility] where CBC showed WBC 10.9, MCV 101.5, CMP showed mildly elevated creatinine 1.04, CK level was elevated at 1528. UDS was positive for opioids. She had an MRI brain with and without contrast which I personally reviewed, no acute changes. EEG showed moderate diffuse background slowing, no epileptiform discharges. It was noted that symptoms were most likely resolving toxic metabolic encephalopathy in the setting of opiate overuse, however since unclear if seizure-related, she was continued on Keppra 500mg  BID.   Since hospital discharge, her daughter reports she is back to baseline. Her daughter now administers medications. Her daughter reports that prior to hospitalization, she was taking Tramadol, Tizanidine, and Trazodone. There is note of hydrocodone, however  her daughter did not find any hydrocodone bottle in the house. She states she has not taken it that day. She is now on Tramadol TID which does not help with pain from fibromyalgia and osteoarthritis. She is scheduled to see Pain Management. Her daughter denies any  staring/unresponsive episodes, she denies any other gaps in time, olfactory/gustatory hallucinations, myoclonic jerks. She has tingling in her left arm and both feet L>R with associated pain. She has swelling in both knees. She does not drive. She has a history of spinal meningitis at age 2. Otherwise she had a normal birth and early development.  There is no history of febrile convulsions, significant traumatic brain injury, neurosurgical procedures, or family history of seizures.  Diagnostic Data:  MRI brain in 03/2017 was unremarkable MRI brain in 12/2019 was unremarkable  EEG in 03/2017 showed moderate diffuse slowing 24-hour EEG in June 2019 which was abnormal, showing occasional left mid-temporal epileptiform discharges during sleep, typical events were not captured.    PAST MEDICAL HISTORY: Past Medical History:  Diagnosis Date   Acute encephalopathy    Allergy    Anemia    Anxiety    Arthritis    Asthma    Blood transfusion without reported diagnosis    Bronchitis    Bursitis    Cervical cancer ([place])    Chronic constipation    Chronic hepatitis C without hepatic coma ([place]) 10/17/2014   11/10/2015 visit with Dr. Linus Salmons. Repeat labs revealed resolution of infection following treatment.  [facility], Florentina Addison, [place], FNP-C; last seen 01/2014 Genotype 1b Diagnosed 01/2013   Chronic kidney disease    stage 3   Colon polyps    Family history of adverse reaction to anesthesia    daughter post-op nausea and vomiting   Fibrocystic breast disease    Fibromyalgia    diagnosed years ago per pt   Galactorrhea on right side    GERD (gastroesophageal reflux disease)    Headache    migraines in the past   Hepatitis C    Hyperlipidemia    Hypertension    Insomnia    Neuromuscular disorder ([place])    Osteoporosis    Respiratory failure ([place]) 03/20/2017   Seizures ([place])     MEDICATIONS: Current Outpatient Medications on File Prior to Visit   Medication Sig Dispense Refill   albuterol (PROVENTIL HFA;VENTOLIN HFA) 108 (90 Base) MCG/ACT inhaler Inhale 2 puffs into the lungs every 6 (six) hours as needed for wheezing. 1 Inhaler 1   atorvastatin (LIPITOR) 40 MG tablet Take 1 tablet (40 mg total) by mouth daily. 30 tablet 11   busPIRone (BUSPAR) 5 MG tablet Take 5 mg by mouth 3 (three) times daily.     diclofenac Sodium (VOLTAREN) 1 % GEL Apply 2 g topically 4 (four) times daily. (Patient taking differently: Apply 2 g topically daily as needed (Pain).) 50 g 1   ergocalciferol (VITAMIN D2) 1.25 MG (50000 UT) capsule Take 50,000 Units by mouth every Wednesday.      folic acid (FOLVITE) 680 MCG tablet Take 800 mcg by mouth in the morning.     gabapentin (NEURONTIN) 300 MG capsule Take 300-600 mg by mouth See admin instructions. Take 300 mg morning and noon and 600 mg at bedtime     hydrochlorothiazide (HYDRODIURIL) 25 MG tablet Take 25 mg by mouth every morning.     HYDROcodone-acetaminophen (NORCO) 10-325 MG tablet Take 1 tablet by mouth every 8 (eight) hours as needed for  moderate pain or severe pain.     ketoconazole (NIZORAL) 2 % cream Apply 1 application topically daily as needed for irritation.     levETIRAcetam (KEPPRA) 500 MG tablet TAKE 1 TABLET BY MOUTH TWICE A DAY (Patient taking differently: Take 500 mg by mouth 2 (two) times daily.) 120 tablet 0   loratadine (CLARITIN) 10 MG tablet Take 10 mg by mouth daily as needed for allergies or rhinitis.     pantoprazole (PROTONIX) 40 MG tablet Take 40 mg by mouth at bedtime.     QUEtiapine (SEROQUEL) 300 MG tablet Take 1 tablet (300 mg total) by mouth at bedtime. 90 tablet 3   tetrahydrozoline-zinc (VISINE-AC) 0.05-0.25 % ophthalmic solution Place 1-2 drops into both eyes 3 (three) times daily as needed (for irritation).      No current facility-administered medications on file prior to visit.    ALLERGIES: Allergies  Allergen Reactions   Molds & Smuts Other (See Comments)    Per  allergy test   Pollen Extract Other (See Comments)    Per allergy test   Aspirin Nausea And Vomiting and Rash    FAMILY HISTORY: Family History  Problem Relation Age of Onset   Asthma Mother    Colon cancer Sister 73   Stroke Brother 82   Stroke Sister 15   Cancer Sister 58       lung cancer, +TOBACCO   Arthritis Daughter    Pulmonary embolism Daughter 75       on chronic warfarin   Hypertension Son 6   Esophageal cancer Neg Hx    Rectal cancer Neg Hx    Stomach cancer Neg Hx     SOCIAL HISTORY: Social History   Socioeconomic History   Marital status: Divorced    Spouse name: Not on file   Number of children: 3   Years of education: 14+   Highest education level: Not on file  Occupational History   Occupation: retired    Comment: phlebotomy, EKG tech; taught both  Tobacco Use   Smoking status: Every Day    Packs/day: 0.50    Years: 40.00    Pack years: 20.00    Types: Cigarettes   Smokeless tobacco: Never   Tobacco comments:    I'm scared of e-cigarettes, interested in patches, tobacco info given 01/10/15  Vaping Use   Vaping Use: Never used  Substance and Sexual Activity   Alcohol use: Yes    Alcohol/week: 2.0 standard drinks    Types: 2 Glasses of wine per week   Drug use: No   Sexual activity: Not Currently  Other Topics Concern   Not on file  Social History Narrative   Raised in [place], [place].   Moved to [place], [place] and then came to [place] in 2013.   Lives alone.   Daughter lives around the corner.    Raised her niece as her own daughter, and she lives nearby.   Son lives in [place].   Right handed   Social Determinants of Health   Financial Resource Strain: Not on file  Food Insecurity: Not on file  Transportation Needs: Not on file  Physical Activity: Not on file  Stress: Not on file  Social Connections: Not on file  Intimate Partner Violence: Not on file     PHYSICAL EXAM: Vitals:   11/19/20 0801  BP: (!) 157/81  Pulse: 83   SpO2: 97%   General: No acute distress Head:  Normocephalic/atraumatic Skin/Extremities: No rash, no edema  Neurological Exam: alert and oriented to person, place, and time. No aphasia or dysarthria. Fund of knowledge is reduced.  Recent and remote memory are impaired, 1/3 delayed recall.  Attention and concentration are normal, 5/5 WORLD backwards.  Cranial nerves: Pupils equal, round. Extraocular movements intact with no nystagmus. Visual fields full.  No facial asymmetry.  Motor: Bulk and tone normal, muscle strength 5/5 on both UE, 4/5 both LE, no pronator drift.   Finger to nose testing intact.  Gait slow and cautious with walker, favoring right leg, no ataxia   IMPRESSION: This is a 75 yo RH woman with a history of f hypertension, hyperlipidemia, hepatitis C, cervical cancer, with left temporal lobe epilepsy diagnosed after an episode of altered mental status in 2019. MRI brain at that time normal. Her 24-hour EEG was abnormal with occasional left mid-temporal epileptiform discharges. She continues to be seizure-free since 2019 on Levetiracetam 500mg  BID. Her daughter will check her bottle at home and let us know if she needs refills or if PCP has been refilling it. She was seen in 12/2019 due to loss of appetite, weight loss, memory loss. After treatment of GI issues, these symptoms have all improved. She does have mild cognitive impairment, we discussed continued close supervision, control of vascular risk factors. Follow-up in 6-8 months, call for any changes.    Thank you for allowing me to participate in her care.  Please do not hesitate to call for any questions or concerns.   Ellouise Newer, M.D.   CC: [name], [place]

## 2020-11-19 NOTE — Patient Instructions (Signed)
Good to see you looking better! Continue Keppra 500mg  twice a day. Please check your bottle at home and let me know if you need refills. Follow-up in 6-8 months, call for any changes.  Seizure Precautions: 1. If medication has been prescribed for you to prevent seizures, take it exactly as directed.  Do not stop taking the medicine without talking to your doctor first, even if you have not had a seizure in a long time.   2. Avoid activities in which a seizure would cause danger to yourself or to others.  Don't operate dangerous machinery, swim alone, or climb in high or dangerous places, such as on ladders, roofs, or girders.  Do not drive unless your doctor says you may.  3. If you have any warning that you may have a seizure, lay down in a safe place where you can't hurt yourself.    4.  No driving for 6 months from last seizure, as per [facility].   Please refer to the following link on the [facility] website for more information: http://[email or web address]/[email or web address]/[email or web address]   5.  Maintain good sleep hygiene.  6.  Contact your doctor if you have any problems that may be related to the medicine you are taking.  7.  Call 911 and bring the patient back to the ED if:        A.  The seizure lasts longer than 5 minutes.       B.  The patient doesn't awaken shortly after the seizure  C.  The patient has new problems such as difficulty seeing, speaking or moving  D.  The patient was injured during the seizure  E.  The patient has a temperature over 102 F (39C)  F.  The patient vomited and now is having trouble breathing        FALL PRECAUTIONS: Be cautious when walking. Scan the area for obstacles that may increase the risk of trips and falls. When getting up in the mornings, sit up at the edge of the bed for a few minutes before getting out of bed. Consider elevating the bed at the head end to avoid drop of blood pressure when  getting up. Walk always in a well-lit room (use night lights in the walls). Avoid area rugs or power cords from appliances in the middle of the walkways. Use a walker or a cane if necessary and consider physical therapy for balance exercise. Get your eyesight checked regularly.  HOME SAFETY: Consider the safety of the kitchen when operating appliances like stoves, microwave oven, and blender. Consider having supervision and share cooking responsibilities until no longer able to participate in those. Accidents with firearms and other hazards in the house should be identified and addressed as well.  ABILITY TO BE LEFT ALONE: If patient is unable to contact 911 operator, consider using LifeLine, or when the need is there, arrange for someone to stay with patients. Smoking is a fire hazard, consider supervision or cessation. Risk of wandering should be assessed by caregiver and if detected at any point, supervision and safe proof recommendations should be instituted.   RECOMMENDATIONS FOR ALL PATIENTS WITH MEMORY PROBLEMS: 1. Continue to exercise (Recommend 30 minutes of walking everyday, or 3 hours every week) 2. Increase social interactions - continue going to [place] and enjoy social gatherings with friends and family 3. Eat healthy, avoid fried foods and eat more fruits and vegetables 4. Maintain adequate blood pressure, blood sugar, and blood cholesterol level.  Reducing the risk of stroke and cardiovascular disease also helps promoting better memory. 5. Avoid stressful situations. Live a simple life and avoid aggravations. Organize your time and prepare for the next day in anticipation. 6. Sleep well, avoid any interruptions of sleep and avoid any distractions in the bedroom that may interfere with adequate sleep quality 7. Avoid sugar, avoid sweets as there is a strong link between excessive sugar intake, diabetes, and cognitive impairment The Mediterranean diet has been shown to help patients reduce  the risk of progressive memory disorders and reduces cardiovascular risk. This includes eating fish, eat fruits and green leafy vegetables, nuts like almonds and hazelnuts, walnuts, and also use olive oil. Avoid fast foods and fried foods as much as possible. Avoid sweets and sugar as sugar use has been linked to worsening of memory function.

## 2021-04-17 NOTE — Progress Notes (Signed)
HISTORY AND PHYSICAL     CC:  follow up. Requesting Provider:  [name], PA  HPI: This is a 76 y.o. female who is here today for follow up for PAD.  She has hx of harvest of right greater saphenous vein, right common femoral to below knee popliteal artery bypass with non reversed greater saphenous vein by Dr. [name] on 09/03/20. This was performed due to RLE rest pain.  Pt was last seen 09/30/2020 and at that time, the pain on the bottom of her right foot was resolved but still having some tenderness along the right shin with intermittent sharp pains.  She was on gabapentin for nerve pain.  Her incisions had healed.  Her ABI's were improved on the right and slightly down on the left but asymptomatic on the left.  She was scheduled for 6 month follow up.    The pt returns today for follow up and here with her daughter [name].  Pt states that she has some pain around her upper thigh incision and lower leg incision.  She does not really endorse pain in her foot  although she has cramping in her feet at night.    The pt is on a statin for cholesterol management.    The pt is not on an aspirin due to allergy.    Other AC:  none The pt is on diuretic for hypertension.  The pt does not have diabetes. Tobacco hx:  current   Past Medical History:  Diagnosis Date   Acute encephalopathy    Allergy    Anemia    Anxiety    Arthritis    Asthma    Blood transfusion without reported diagnosis    Bronchitis    Bursitis    Cervical cancer ([place])    Chronic constipation    Chronic hepatitis C without hepatic coma ([place]) 10/17/2014   11/10/2015 visit with Dr. Linus Salmons. Repeat labs revealed resolution of infection following treatment.  [facility], Florentina Addison, [place], FNP-C; last seen 01/2014 Genotype 1b Diagnosed 01/2013   Chronic kidney disease    stage 3   Colon polyps    Family history of adverse reaction to anesthesia    daughter post-op nausea and vomiting    Fibrocystic breast disease    Fibromyalgia    diagnosed years ago per pt   Galactorrhea on right side    GERD (gastroesophageal reflux disease)    Headache    migraines in the past   Hepatitis C    Hyperlipidemia    Hypertension    Insomnia    Neuromuscular disorder ([place])    Osteoporosis    Respiratory failure ([place]) 03/20/2017   Seizures ([place])     Past Surgical History:  Procedure Laterality Date   ABDOMINAL AORTOGRAM W/LOWER EXTREMITY Right 08/14/2020   Procedure: ABDOMINAL AORTOGRAM W/LOWER EXTREMITY;  Surgeon: [name], MD;  Location: [facility];  Service: Cardiovascular;  Laterality: Right;   ABDOMINAL HYSTERECTOMY  30's   due to cervical cancer   APPENDECTOMY     BIOPSY  01/29/2020   Procedure: BIOPSY;  Surgeon: Rush Landmark Telford Nab., MD;  Location: [facility];  Service: Gastroenterology;;   DILATION AND CURETTAGE OF UTERUS     pt says years ago when living in [place]   ESOPHAGOGASTRODUODENOSCOPY (EGD) WITH PROPOFOL N/A 01/29/2020   Procedure: ESOPHAGOGASTRODUODENOSCOPY (EGD) WITH PROPOFOL;  Surgeon: Irving Copas., MD;  Location: [facility];  Service: Gastroenterology;  Laterality: N/A;  FEMORAL-POPLITEAL BYPASS GRAFT Right 09/03/2020   Procedure: BYPASS GRAFT FEMORAL-POPLITEAL ARTERY USING NON-REVERSED GREATER SAPHENOUS VEIN RIGHT;  Surgeon: [name], MD;  Location: [facility];  Service: Vascular;  Laterality: Right;   POLYPECTOMY     SAVORY DILATION N/A 01/29/2020   Procedure: [place] Setting DILATION;  Surgeon: Irving Copas., MD;  Location: [facility];  Service: Gastroenterology;  Laterality: N/A;    Allergies  Allergen Reactions   Molds & Smuts Other (See Comments)    Per allergy test   Pollen Extract Other (See Comments)    Per allergy test   Aspirin Nausea And Vomiting and Rash    Current Outpatient Medications  Medication Sig Dispense Refill   albuterol (PROVENTIL HFA;VENTOLIN HFA) 108 (90 Base) MCG/ACT inhaler  Inhale 2 puffs into the lungs every 6 (six) hours as needed for wheezing. 1 Inhaler 1   atorvastatin (LIPITOR) 40 MG tablet Take 1 tablet (40 mg total) by mouth daily. 30 tablet 11   busPIRone (BUSPAR) 5 MG tablet Take 5 mg by mouth 3 (three) times daily.     diclofenac Sodium (VOLTAREN) 1 % GEL Apply 2 g topically 4 (four) times daily. (Patient taking differently: Apply 2 g topically daily as needed (Pain).) 50 g 1   ergocalciferol (VITAMIN D2) 1.25 MG (50000 UT) capsule Take 50,000 Units by mouth every Wednesday.      folic acid (FOLVITE) 093 MCG tablet Take 800 mcg by mouth in the morning.     gabapentin (NEURONTIN) 300 MG capsule Take 300-600 mg by mouth See admin instructions. Take 300 mg morning and noon and 600 mg at bedtime     hydrochlorothiazide (HYDRODIURIL) 25 MG tablet Take 25 mg by mouth every morning.     ketoconazole (NIZORAL) 2 % cream Apply 1 application topically daily as needed for irritation.     levETIRAcetam (KEPPRA) 500 MG tablet TAKE 1 TABLET BY MOUTH TWICE A DAY (Patient taking differently: Take 500 mg by mouth 2 (two) times daily.) 120 tablet 0   loratadine (CLARITIN) 10 MG tablet Take 10 mg by mouth daily as needed for allergies or rhinitis.     pantoprazole (PROTONIX) 40 MG tablet Take 40 mg by mouth at bedtime.     QUEtiapine (SEROQUEL) 300 MG tablet Take 1 tablet (300 mg total) by mouth at bedtime. 90 tablet 3   tetrahydrozoline-zinc (VISINE-AC) 0.05-0.25 % ophthalmic solution Place 1-2 drops into both eyes 3 (three) times daily as needed (for irritation).      No current facility-administered medications for this visit.    Family History  Problem Relation Age of Onset   Asthma Mother    Colon cancer Sister 26   Stroke Brother 77   Stroke Sister 62   Cancer Sister 66       lung cancer, +TOBACCO   Arthritis Daughter    Pulmonary embolism Daughter 78       on chronic warfarin   Hypertension Son 50   Esophageal cancer Neg Hx    Rectal cancer Neg Hx     Stomach cancer Neg Hx     Social History   Socioeconomic History   Marital status: Divorced    Spouse name: Not on file   Number of children: 3   Years of education: 14+   Highest education level: Not on file  Occupational History   Occupation: retired    Comment: phlebotomy, EKG tech; taught both  Tobacco Use   Smoking status: Every Day    Packs/day:  0.50    Years: 40.00    Pack years: 20.00    Types: Cigarettes   Smokeless tobacco: Never   Tobacco comments:    I'm scared of e-cigarettes, interested in patches, tobacco info given 01/10/15  Vaping Use   Vaping Use: Never used  Substance and Sexual Activity   Alcohol use: Yes    Alcohol/week: 2.0 standard drinks    Types: 2 Glasses of wine per week   Drug use: No   Sexual activity: Not Currently  Other Topics Concern   Not on file  Social History Narrative   Raised in [place], [place].   Moved to [place], [place] and then came to [place] in 2013.   Lives alone.   Daughter lives around the corner.    Raised her niece as her own daughter, and she lives nearby.   Son lives in [place].   Right handed   Social Determinants of Health   Financial Resource Strain: Not on file  Food Insecurity: Not on file  Transportation Needs: Not on file  Physical Activity: Not on file  Stress: Not on file  Social Connections: Not on file  Intimate Partner Violence: Not on file     REVIEW OF SYSTEMS:   [X]  denotes positive finding, [ ]  denotes negative finding Cardiac  Comments:  Chest pain or chest pressure:    Shortness of breath upon exertion:    Short of breath when lying flat:    Irregular heart rhythm:        Vascular    Pain in calf, thigh, or hip brought on by ambulation:    Pain in feet at night that wakes you up from your sleep:     Blood clot in your veins:    Leg swelling:         Pulmonary    Oxygen at home:    Productive cough:     Wheezing:         Neurologic    Sudden weakness in arms or legs:      Sudden numbness in arms or legs:     Sudden onset of difficulty speaking or slurred speech:    Temporary loss of vision in one eye:     Problems with dizziness:         Gastrointestinal    Blood in stool:     Vomited blood:         Genitourinary    Burning when urinating:     Blood in urine:        Psychiatric    Major depression:         Hematologic    Bleeding problems:    Problems with blood clotting too easily:        Skin    Rashes or ulcers:        Constitutional    Fever or chills:      PHYSICAL EXAMINATION:  Today's Vitals   04/21/21 1456  BP: 128/75  Pulse: 98  Resp: 20  Temp: 97.9 F (36.6 C)  TempSrc: Temporal  SpO2: 98%  Weight: 185 lb (83.9 kg)  Height: 5\' 5"  (1.651 m)  PainSc: 7   PainLoc: Leg   Body mass index is 30.79 kg/m.   General:  WDWN in NAD; vital signs documented above Gait: Not observed HENT: WNL, normocephalic Pulmonary: normal non-labored breathing , without wheezing Cardiac: regular HR, without carotid bruits Abdomen: soft, NT, no masses; aortic pulse is not palpable Skin: without rashes  Vascular Exam/Pulses: Femoral pulses are difficult to palpate due to body habitus.  Radial pulses are palpable bilaterally;  pedal pulses are not palpable.   Extremities: without ischemic changes, without Gangrene , without cellulitis; without open wounds;  Musculoskeletal: no muscle wasting or atrophy  Neurologic: A&O X 3 Psychiatric:  The pt has Normal affect.   Non-Invasive Vascular Imaging:   ABI's/TBI's on 04/21/2021: Right:  0.72/0.32 - Great toe pressure: 47 Left:  0.40/0.27 - Great toe pressure: 39  Arterial duplex on 04/21/2021: Right Graft #1: fem-BK pop  +------------------+--------+---------------+----------+--------+                      PSV cm/s Stenosis        Waveform   Comments   +------------------+--------+---------------+----------+--------+   Inflow             279      50-74% stenosis monophasic             +------------------+--------+---------------+----------+--------+   Prox Anastomosis   512      >70% stenosis   monophasic            +------------------+--------+---------------+----------+--------+   Proximal Graft     100                      biphasic              +------------------+--------+---------------+----------+--------+   Mid Graft          50                       monophasic            +------------------+--------+---------------+----------+--------+   Distal Graft       50                       monophasic            +------------------+--------+---------------+----------+--------+   Distal Anastomosis 328      >70% stenosis   monophasic            +------------------+--------+---------------+----------+--------+   Outflow            97                       monophasic            +------------------+--------+---------------+----------+--------+  Previous ABI's/TBI's on 09/30/2020: Right:  0.74/0.43 - Great toe pressure: 80 Left:  0.62/0.48 - Great toe pressure:  91  Previous arterial duplex on 09/30/2020: Right: Patent right fem-BK popliteal bypass graft with no stenosis  identified.   ASSESSMENT/PLAN:: 76 y.o. female here for follow up for hx of harvest of right greater saphenous vein, right common femoral to below knee popliteal artery bypass with non reversed greater saphenous vein by Dr. [name] on 09/03/20. This was performed due to RLE rest pain.  -on duplex today, pt has a stenosis at the proximal anastomosis with velocities greater than 500 cm/s.  Discussed with pt and daughter that she would need to have angiogram to address this stenosis.  -discussed the great importance of smoking cessation with pt and she is going to work on this.  She knows that continued smoking puts her at risk for heart attack, stroke and limb loss.  -she is not on asa as she is allergic.  Discussed with her that if Dr. [name] performs intervention during angiogram, she most likely would need  to be on  Plavix.   -continue statin -we will get this scheduled at her earliest convenience.   Leontine Locket, [facility] [phone]  Clinic MD:   [name]

## 2021-04-21 ENCOUNTER — Encounter: Payer: Self-pay | Admitting: Physician Assistant

## 2021-04-21 ENCOUNTER — Other Ambulatory Visit: Payer: Self-pay

## 2021-04-21 ENCOUNTER — Ambulatory Visit (INDEPENDENT_AMBULATORY_CARE_PROVIDER_SITE_OTHER)
Admission: RE | Admit: 2021-04-21 | Discharge: 2021-04-21 | Disposition: A | Discharge status: Home / Self Care | Payer: Medicare HMO | Source: Ambulatory Visit | Attending: Vascular Surgery | Admitting: Vascular Surgery

## 2021-04-21 ENCOUNTER — Ambulatory Visit: Payer: Medicare HMO | Admitting: Physician Assistant

## 2021-04-21 ENCOUNTER — Ambulatory Visit (HOSPITAL_COMMUNITY)
Admission: RE | Admit: 2021-04-21 | Discharge: 2021-04-21 | Disposition: A | Discharge status: Home / Self Care | Payer: Medicare HMO | Source: Ambulatory Visit | Attending: Vascular Surgery | Admitting: Vascular Surgery

## 2021-04-21 VITALS — BP 128/75 | HR 98 | Temp 97.9°F | Resp 20 | Ht 65.0 in | Wt 185.0 lb

## 2021-04-21 DIAGNOSIS — I739 Peripheral vascular disease, unspecified: Secondary | ICD-10-CM

## 2021-04-24 ENCOUNTER — Other Ambulatory Visit: Payer: Self-pay

## 2021-05-07 ENCOUNTER — Encounter (HOSPITAL_COMMUNITY)
Admission: RE | Disposition: A | Discharge status: Home / Self Care | Payer: Self-pay | Source: Home / Self Care | Attending: Vascular Surgery

## 2021-05-07 ENCOUNTER — Ambulatory Visit (HOSPITAL_COMMUNITY)
Admission: RE | Admit: 2021-05-07 | Discharge: 2021-05-07 | Disposition: A | Discharge status: Home / Self Care | Payer: Medicare HMO | Attending: Vascular Surgery | Admitting: Vascular Surgery

## 2021-05-07 ENCOUNTER — Other Ambulatory Visit: Payer: Self-pay

## 2021-05-07 DIAGNOSIS — E785 Hyperlipidemia, unspecified: Secondary | ICD-10-CM | POA: Insufficient documentation

## 2021-05-07 DIAGNOSIS — R569 Unspecified convulsions: Secondary | ICD-10-CM | POA: Insufficient documentation

## 2021-05-07 DIAGNOSIS — T82858A Stenosis of vascular prosthetic devices, implants and grafts, initial encounter: Secondary | ICD-10-CM | POA: Diagnosis not present

## 2021-05-07 DIAGNOSIS — G709 Myoneural disorder, unspecified: Secondary | ICD-10-CM | POA: Insufficient documentation

## 2021-05-07 DIAGNOSIS — Y832 Surgical operation with anastomosis, bypass or graft as the cause of abnormal reaction of the patient, or of later complication, without mention of misadventure at the time of the procedure: Secondary | ICD-10-CM | POA: Diagnosis not present

## 2021-05-07 DIAGNOSIS — I70221 Atherosclerosis of native arteries of extremities with rest pain, right leg: Secondary | ICD-10-CM | POA: Diagnosis not present

## 2021-05-07 DIAGNOSIS — Z886 Allergy status to analgesic agent status: Secondary | ICD-10-CM | POA: Insufficient documentation

## 2021-05-07 DIAGNOSIS — Z79899 Other long term (current) drug therapy: Secondary | ICD-10-CM | POA: Diagnosis not present

## 2021-05-07 DIAGNOSIS — T85858A Stenosis due to other internal prosthetic devices, implants and grafts, initial encounter: Secondary | ICD-10-CM | POA: Diagnosis present

## 2021-05-07 DIAGNOSIS — N183 Chronic kidney disease, stage 3 unspecified: Secondary | ICD-10-CM | POA: Diagnosis not present

## 2021-05-07 DIAGNOSIS — I129 Hypertensive chronic kidney disease with stage 1 through stage 4 chronic kidney disease, or unspecified chronic kidney disease: Secondary | ICD-10-CM | POA: Insufficient documentation

## 2021-05-07 DIAGNOSIS — F17210 Nicotine dependence, cigarettes, uncomplicated: Secondary | ICD-10-CM | POA: Diagnosis not present

## 2021-05-07 HISTORY — PX: PERIPHERAL VASCULAR BALLOON ANGIOPLASTY: CATH118281

## 2021-05-07 HISTORY — PX: ABDOMINAL AORTOGRAM W/LOWER EXTREMITY: CATH118223

## 2021-05-07 LAB — POCT I-STAT, CHEM 8
BUN: 15 mg/dL (ref 8–23)
Calcium, Ion: 1.14 mmol/L — ABNORMAL LOW (ref 1.15–1.40)
Chloride: 102 mmol/L (ref 98–111)
Creatinine, Ser: 0.6 mg/dL (ref 0.44–1.00)
Glucose, Bld: 113 mg/dL — ABNORMAL HIGH (ref 70–99)
HCT: 39 % (ref 36.0–46.0)
Hemoglobin: 13.3 g/dL (ref 12.0–15.0)
Potassium: 3 mmol/L — ABNORMAL LOW (ref 3.5–5.1)
Sodium: 138 mmol/L (ref 135–145)
TCO2: 23 mmol/L (ref 22–32)

## 2021-05-07 LAB — POCT ACTIVATED CLOTTING TIME
Activated Clotting Time: 287 seconds
Activated Clotting Time: 233 seconds
Activated Clotting Time: 173 seconds

## 2021-05-07 SURGERY — ABDOMINAL AORTOGRAM W/LOWER EXTREMITY
Anesthesia: LOCAL

## 2021-05-07 MED ORDER — MIDAZOLAM HCL 2 MG/2ML IJ SOLN
INTRAMUSCULAR | Status: AC
  Filled 2021-05-07: qty 2

## 2021-05-07 MED ORDER — FENTANYL CITRATE (PF) 100 MCG/2ML IJ SOLN
INTRAMUSCULAR | Status: AC
Start: 2021-05-07 — End: ?
  Filled 2021-05-07: qty 2

## 2021-05-07 MED ORDER — SODIUM CHLORIDE 0.9 % IV SOLN
250.0000 mL | INTRAVENOUS | Status: DC | PRN
Start: 2021-05-07 — End: 2021-05-07

## 2021-05-07 MED ORDER — HEPARIN SODIUM (PORCINE) 1000 UNIT/ML IJ SOLN
INTRAMUSCULAR | Status: DC | PRN
  Administered 2021-05-07: 9000 [IU] via INTRAVENOUS

## 2021-05-07 MED ORDER — HYDRALAZINE HCL 20 MG/ML IJ SOLN: 5.0000 mg | INTRAMUSCULAR | Status: DC | PRN

## 2021-05-07 MED ORDER — HEPARIN (PORCINE) IN NACL 1000-0.9 UT/500ML-% IV SOLN
INTRAVENOUS | Status: DC | PRN
Start: 2021-05-07 — End: 2021-05-07
  Administered 2021-05-07 (×3): 500 mL

## 2021-05-07 MED ORDER — MIDAZOLAM HCL 2 MG/2ML IJ SOLN
INTRAMUSCULAR | Status: DC | PRN
  Administered 2021-05-07 (×2): 1 mg via INTRAVENOUS

## 2021-05-07 MED ORDER — LABETALOL HCL 5 MG/ML IV SOLN: 10.0000 mg | INTRAVENOUS | Status: DC | PRN

## 2021-05-07 MED ORDER — LABETALOL HCL 5 MG/ML IV SOLN
INTRAVENOUS | Status: AC
  Filled 2021-05-07: qty 4

## 2021-05-07 MED ORDER — CLOPIDOGREL BISULFATE 300 MG PO TABS
ORAL_TABLET | ORAL | Status: AC
Start: 2021-05-07 — End: ?
  Filled 2021-05-07: qty 1

## 2021-05-07 MED ORDER — CLOPIDOGREL BISULFATE 300 MG PO TABS
ORAL_TABLET | ORAL | Status: DC | PRN
  Administered 2021-05-07: 300 mg via ORAL

## 2021-05-07 MED ORDER — CLOPIDOGREL BISULFATE 75 MG PO TABS: 300.0000 mg | ORAL_TABLET | Freq: Once | ORAL | Status: DC

## 2021-05-07 MED ORDER — FENTANYL CITRATE (PF) 100 MCG/2ML IJ SOLN
INTRAMUSCULAR | Status: DC | PRN
  Administered 2021-05-07 (×3): 25 ug via INTRAVENOUS

## 2021-05-07 MED ORDER — CLOPIDOGREL BISULFATE 75 MG PO TABS: 75.0000 mg | ORAL_TABLET | Freq: Every day | ORAL | 11 refills | Status: AC

## 2021-05-07 MED ORDER — SODIUM CHLORIDE 0.9% FLUSH: 3.0000 mL | Freq: Two times a day (BID) | INTRAVENOUS | Status: DC

## 2021-05-07 MED ORDER — SODIUM CHLORIDE 0.9 % IV SOLN: INTRAVENOUS | Status: AC

## 2021-05-07 MED ORDER — SODIUM CHLORIDE 0.9 % IV SOLN: INTRAVENOUS | Status: DC

## 2021-05-07 MED ORDER — LIDOCAINE HCL (PF) 1 % IJ SOLN
INTRAMUSCULAR | Status: AC
  Filled 2021-05-07: qty 30

## 2021-05-07 MED ORDER — ACETAMINOPHEN 325 MG PO TABS: 650.0000 mg | ORAL_TABLET | ORAL | Status: DC | PRN

## 2021-05-07 MED ORDER — POTASSIUM CHLORIDE CRYS ER 20 MEQ PO TBCR
40.0000 meq | EXTENDED_RELEASE_TABLET | Freq: Once | ORAL | Status: AC
  Administered 2021-05-07: 07:00:00 40 meq via ORAL
  Filled 2021-05-07: qty 2

## 2021-05-07 MED ORDER — CLOPIDOGREL BISULFATE 75 MG PO TABS: 75.0000 mg | ORAL_TABLET | Freq: Every day | ORAL | Status: DC

## 2021-05-07 MED ORDER — LIDOCAINE HCL (PF) 1 % IJ SOLN
INTRAMUSCULAR | Status: DC | PRN
  Administered 2021-05-07: 15 mL

## 2021-05-07 MED ORDER — ONDANSETRON HCL 4 MG/2ML IJ SOLN: 4.0000 mg | Freq: Four times a day (QID) | INTRAMUSCULAR | Status: DC | PRN

## 2021-05-07 MED ORDER — HEPARIN (PORCINE) IN NACL 1000-0.9 UT/500ML-% IV SOLN
INTRAVENOUS | Status: AC
Start: 2021-05-07 — End: ?
  Filled 2021-05-07: qty 1000

## 2021-05-07 MED ORDER — SODIUM CHLORIDE 0.9% FLUSH: 3.0000 mL | INTRAVENOUS | Status: DC | PRN

## 2021-05-07 MED ORDER — LABETALOL HCL 5 MG/ML IV SOLN
INTRAVENOUS | Status: DC | PRN
  Administered 2021-05-07: 10 mg via INTRAVENOUS

## 2021-05-07 MED ORDER — HEPARIN (PORCINE) IN NACL 1000-0.9 UT/500ML-% IV SOLN
INTRAVENOUS | Status: AC
  Filled 2021-05-07: qty 500

## 2021-05-07 MED ORDER — HEPARIN SODIUM (PORCINE) 1000 UNIT/ML IJ SOLN
INTRAMUSCULAR | Status: AC
  Filled 2021-05-07: qty 10

## 2021-05-07 MED ORDER — IODIXANOL 320 MG/ML IV SOLN
INTRAVENOUS | Status: DC | PRN
  Administered 2021-05-07: 09:00:00 140 mL

## 2021-05-07 SURGICAL SUPPLY — 18 items
BAG SNAP BAND KOVER 36X36 (MISCELLANEOUS) ×1 IMPLANT
BALLN STERLING OTW 4X20X135 (BALLOONS) ×3
BALLOON STERLING OTW 4X20X135 (BALLOONS) IMPLANT
CATH 0.018 NAVICROSS ST 135 (CATHETERS) ×1 IMPLANT
CATH OMNI FLUSH 5F 65CM (CATHETERS) ×1 IMPLANT
DCB RANGER 4.0X40 135 (BALLOONS) IMPLANT
KIT ENCORE 26 ADVANTAGE (KITS) ×1 IMPLANT
KIT MICROPUNCTURE NIT STIFF (SHEATH) ×1 IMPLANT
KIT PV (KITS) ×3 IMPLANT
RANGER DCB 4.0X40 135 (BALLOONS) ×3
SHEATH DESTINATION MP 5FR 45CM (SHEATH) ×1 IMPLANT
SHEATH PINNACLE 5F 10CM (SHEATH) ×1 IMPLANT
SYR MEDRAD MARK 7 150ML (SYRINGE) ×1 IMPLANT
SYR MEDRAD MARK V 150ML (SYRINGE) ×1 IMPLANT
TRANSDUCER W/STOPCOCK (MISCELLANEOUS) ×3 IMPLANT
TRAY PV CATH (CUSTOM PROCEDURE TRAY) ×3 IMPLANT
WIRE BENTSON .035X145CM (WIRE) ×1 IMPLANT
WIRE G V18X300CM (WIRE) ×1 IMPLANT

## 2021-05-07 NOTE — H&P (Signed)
History and Physical Interval Note:  05/07/2021 7:32 AM  Heather Horton  has presented today for surgery, with the diagnosis of PAD.  The various methods of treatment have been discussed with the patient and family. After consideration of risks, benefits and other options for treatment, the patient has consented to  Procedure(s): ABDOMINAL AORTOGRAM W/LOWER EXTREMITY (N/A) as a surgical intervention.  The patient's history has been reviewed, patient examined, no change in status, stable for surgery.  I have reviewed the patient's chart and labs.  Questions were answered to the patient's satisfaction.     [name]  HISTORY AND PHYSICAL        CC:  follow up. Requesting Provider:  [name], PA   HPI: This is a 76 y.o. female who is here today for follow up for PAD.  She has hx of harvest of right greater saphenous vein, right common femoral to below knee popliteal artery bypass with non reversed greater saphenous vein by Dr. [name] on 09/03/20. This was performed due to RLE rest pain.   Pt was last seen 09/30/2020 and at that time, the pain on the bottom of her right foot was resolved but still having some tenderness along the right shin with intermittent sharp pains.  She was on gabapentin for nerve pain.  Her incisions had healed.  Her ABI's were improved on the right and slightly down on the left but asymptomatic on the left.  She was scheduled for 6 month follow up.     The pt returns today for follow up and here with her daughter Heather Horton.  Pt states that she has some pain around her upper thigh incision and lower leg incision.  She does not really endorse pain in her foot  although she has cramping in her feet at night.     The pt is on a statin for cholesterol management.    The pt is not on an aspirin due to allergy.    Other AC:  none The pt is on diuretic for hypertension.  The pt does not have diabetes. Tobacco hx:  current         Past Medical History:  Diagnosis  Date   Acute encephalopathy     Allergy     Anemia     Anxiety     Arthritis     Asthma     Blood transfusion without reported diagnosis     Bronchitis     Bursitis     Cervical cancer ([place])     Chronic constipation     Chronic hepatitis C without hepatic coma ([place]) 10/17/2014    11/10/2015 visit with Dr. Linus Salmons. Repeat labs revealed resolution of infection following treatment.  Gray, [place], FNP-C; last seen 01/2014 Genotype 1b Diagnosed 01/2013   Chronic kidney disease      stage 3   Colon polyps     Family history of adverse reaction to anesthesia      daughter post-op nausea and vomiting   Fibrocystic breast disease     Fibromyalgia      diagnosed years ago per pt   Galactorrhea on right side     GERD (gastroesophageal reflux disease)     Headache      migraines in the past   Hepatitis C     Hyperlipidemia     Hypertension     Insomnia     Neuromuscular disorder ([place])     Osteoporosis  Respiratory failure ([place]) 03/20/2017   Seizures ([place])             Past Surgical History:  Procedure Laterality Date   ABDOMINAL AORTOGRAM W/LOWER EXTREMITY Right 08/14/2020    Procedure: ABDOMINAL AORTOGRAM W/LOWER EXTREMITY;  Surgeon: [name], MD;  Location: [facility];  Service: Cardiovascular;  Laterality: Right;   ABDOMINAL HYSTERECTOMY   30's    due to cervical cancer   APPENDECTOMY       BIOPSY   01/29/2020    Procedure: BIOPSY;  Surgeon: Rush Landmark Telford Nab., MD;  Location: [facility];  Service: Gastroenterology;;   DILATION AND CURETTAGE OF UTERUS        pt says years ago when living in [place]   ESOPHAGOGASTRODUODENOSCOPY (EGD) WITH PROPOFOL N/A 01/29/2020    Procedure: ESOPHAGOGASTRODUODENOSCOPY (EGD) WITH PROPOFOL;  Surgeon: Irving Copas., MD;  Location: [facility];  Service: Gastroenterology;  Laterality: N/A;   FEMORAL-POPLITEAL BYPASS GRAFT Right 09/03/2020    Procedure: BYPASS  GRAFT FEMORAL-POPLITEAL ARTERY USING NON-REVERSED GREATER SAPHENOUS VEIN RIGHT;  Surgeon: [name], MD;  Location: [facility];  Service: Vascular;  Laterality: Right;   POLYPECTOMY       SAVORY DILATION N/A 01/29/2020    Procedure: Benson Setting DILATION;  Surgeon: Irving Copas., MD;  Location: [facility];  Service: Gastroenterology;  Laterality: N/A;           Allergies  Allergen Reactions   Molds & Smuts Other (See Comments)      Per allergy test   Pollen Extract Other (See Comments)      Per allergy test   Aspirin Nausea And Vomiting and Rash            Current Outpatient Medications  Medication Sig Dispense Refill   albuterol (PROVENTIL HFA;VENTOLIN HFA) 108 (90 Base) MCG/ACT inhaler Inhale 2 puffs into the lungs every 6 (six) hours as needed for wheezing. 1 Inhaler 1   atorvastatin (LIPITOR) 40 MG tablet Take 1 tablet (40 mg total) by mouth daily. 30 tablet 11   busPIRone (BUSPAR) 5 MG tablet Take 5 mg by mouth 3 (three) times daily.       diclofenac Sodium (VOLTAREN) 1 % GEL Apply 2 g topically 4 (four) times daily. (Patient taking differently: Apply 2 g topically daily as needed (Pain).) 50 g 1   ergocalciferol (VITAMIN D2) 1.25 MG (50000 UT) capsule Take 50,000 Units by mouth every Wednesday.        folic acid (FOLVITE) 030 MCG tablet Take 800 mcg by mouth in the morning.       gabapentin (NEURONTIN) 300 MG capsule Take 300-600 mg by mouth See admin instructions. Take 300 mg morning and noon and 600 mg at bedtime       hydrochlorothiazide (HYDRODIURIL) 25 MG tablet Take 25 mg by mouth every morning.       ketoconazole (NIZORAL) 2 % cream Apply 1 application topically daily as needed for irritation.       levETIRAcetam (KEPPRA) 500 MG tablet TAKE 1 TABLET BY MOUTH TWICE A DAY (Patient taking differently: Take 500 mg by mouth 2 (two) times daily.) 120 tablet 0   loratadine (CLARITIN) 10 MG tablet Take 10 mg by mouth daily as needed for allergies or rhinitis.        pantoprazole (PROTONIX) 40 MG tablet Take 40 mg by mouth at bedtime.       QUEtiapine (SEROQUEL) 300 MG tablet Take 1 tablet (300 mg total) by mouth at  bedtime. 90 tablet 3   tetrahydrozoline-zinc (VISINE-AC) 0.05-0.25 % ophthalmic solution Place 1-2 drops into both eyes 3 (three) times daily as needed (for irritation).         No current facility-administered medications for this visit.           Family History  Problem Relation Age of Onset   Asthma Mother     Colon cancer Sister 37   Stroke Brother 31   Stroke Sister 27   Cancer Sister 33        lung cancer, +TOBACCO   Arthritis Daughter     Pulmonary embolism Daughter 110        on chronic warfarin   Hypertension Son 41   Esophageal cancer Neg Hx     Rectal cancer Neg Hx     Stomach cancer Neg Hx        Social History         Socioeconomic History   Marital status: Divorced      Spouse name: Not on file   Number of children: 3   Years of education: 14+   Highest education level: Not on file  Occupational History   Occupation: retired      Comment: phlebotomy, EKG tech; taught both  Tobacco Use   Smoking status: Every Day      Packs/day: 0.50      Years: 40.00      Pack years: 20.00      Types: Cigarettes   Smokeless tobacco: Never   Tobacco comments:      I'm scared of e-cigarettes, interested in patches, tobacco info given 01/10/15  Vaping Use   Vaping Use: Never used  Substance and Sexual Activity   Alcohol use: Yes      Alcohol/week: 2.0 standard drinks      Types: 2 Glasses of wine per week   Drug use: No   Sexual activity: Not Currently  Other Topics Concern   Not on file  Social History Narrative    Raised in [place], [place].    Moved to [place], [place] and then came to [place] in 2013.    Lives alone.    Daughter lives around the corner.     Raised her niece as her own daughter, and she lives nearby.    Son lives in [place].    Right handed    Social Determinants of Health    Financial  Resource Strain: Not on file  Food Insecurity: Not on file  Transportation Needs: Not on file  Physical Activity: Not on file  Stress: Not on file  Social Connections: Not on file  Intimate Partner Violence: Not on file        REVIEW OF SYSTEMS:    '[X]'$  denotes positive finding, '[ ]'$  denotes negative finding Cardiac   Comments:  Chest pain or chest pressure:      Shortness of breath upon exertion:      Short of breath when lying flat:      Irregular heart rhythm:             Vascular      Pain in calf, thigh, or hip brought on by ambulation:      Pain in feet at night that wakes you up from your sleep:       Blood clot in your veins:      Leg swelling:              Pulmonary  Oxygen at home:      Productive cough:       Wheezing:              Neurologic      Sudden weakness in arms or legs:       Sudden numbness in arms or legs:       Sudden onset of difficulty speaking or slurred speech:      Temporary loss of vision in one eye:       Problems with dizziness:              Gastrointestinal      Blood in stool:       Vomited blood:              Genitourinary      Burning when urinating:       Blood in urine:             Psychiatric      Major depression:              Hematologic      Bleeding problems:      Problems with blood clotting too easily:             Skin      Rashes or ulcers:             Constitutional      Fever or chills:          PHYSICAL EXAMINATION:      Today's Vitals    04/21/21 1456  BP: 128/75  Pulse: 98  Resp: 20  Temp: 97.9 F (36.6 C)  TempSrc: Temporal  SpO2: 98%  Weight: 185 lb (83.9 kg)  Height: '5\' 5"'$  (1.651 m)  PainSc: 7   PainLoc: Leg    Body mass index is 30.79 kg/m.     General:  WDWN in NAD; vital signs documented above Gait: Not observed HENT: WNL, normocephalic Pulmonary: normal non-labored breathing , without wheezing Cardiac: regular HR, without carotid bruits Abdomen: soft, NT, no masses; aortic  pulse is not palpable Skin: without rashes Vascular Exam/Pulses: Femoral pulses are difficult to palpate due to body habitus.  Radial pulses are palpable bilaterally;  pedal pulses are not palpable.   Extremities: without ischemic changes, without Gangrene , without cellulitis; without open wounds;  Musculoskeletal: no muscle wasting or atrophy       Neurologic: A&O X 3 Psychiatric:  The pt has Normal affect.     Non-Invasive Vascular Imaging:   ABI's/TBI's on 04/21/2021: Right:  0.72/0.32 - Great toe pressure: 47 Left:  0.40/0.27 - Great toe pressure: 39   Arterial duplex on 04/21/2021: Right Graft #1: fem-BK pop  +------------------+--------+---------------+----------+--------+                      PSV cm/s Stenosis        Waveform   Comments   +------------------+--------+---------------+----------+--------+   Inflow             279      50-74% stenosis monophasic            +------------------+--------+---------------+----------+--------+   Prox Anastomosis   512      >70% stenosis   monophasic            +------------------+--------+---------------+----------+--------+   Proximal Graft     100                      biphasic              +------------------+--------+---------------+----------+--------+  Mid Graft          50                       monophasic            +------------------+--------+---------------+----------+--------+   Distal Graft       50                       monophasic            +------------------+--------+---------------+----------+--------+   Distal Anastomosis 328      >70% stenosis   monophasic            +------------------+--------+---------------+----------+--------+   Outflow            97                       monophasic            +------------------+--------+---------------+----------+--------+   Previous ABI's/TBI's on 09/30/2020: Right:  0.74/0.43 - Great toe pressure: 80 Left:  0.62/0.48 - Great toe pressure:  91   Previous arterial duplex on  09/30/2020: Right: Patent right fem-BK popliteal bypass graft with no stenosis  identified.     ASSESSMENT/PLAN:: 76 y.o. female here for follow up for hx of harvest of right greater saphenous vein, right common femoral to below knee popliteal artery bypass with non reversed greater saphenous vein by Dr. [name] on 09/03/20. This was performed due to RLE rest pain.   -on duplex today, pt has a stenosis at the proximal anastomosis with velocities greater than 500 cm/s.  Discussed with pt and daughter that she would need to have angiogram to address this stenosis.  -discussed the great importance of smoking cessation with pt and she is going to work on this.  She knows that continued smoking puts her at risk for heart attack, stroke and limb loss.  -she is not on asa as she is allergic.  Discussed with her that if Dr. [name] performs intervention during angiogram, she most likely would need to be on Plavix.   -continue statin -we will get this scheduled at her earliest convenience.     Leontine Locket, [facility] [phone]   Clinic MD:   [name]

## 2021-05-07 NOTE — Op Note (Signed)
? ? ?Patient name: Heather Horton MRN: [id number] DOB: 16-Sep-1945 Sex: female ? ?05/07/2021 ?Pre-operative Diagnosis: Concern for high-grade greater than 80% stenosis of both the proximal and distal bypass in her right lower extremity common femoral to below-knee popliteal artery bypass with vein graft ?Post-operative diagnosis:  Same ?Surgeon:  [name], MD ?Procedure Performed: ?1.  Ultrasound-guided access left common femoral artery ?2.  Aortogram including catheter selection of aorta ?3.  Right lower extremity arteriogram with selection of third-order branches ?4.  Angioplasty of right lower extremity vein bypass graft onto the below-knee popliteal artery (4 mm x 20 mm Mustang and 4 mm x 40 mm drug-coated Ranger) ?5.  94 minutes of monitored moderate conscious sedation time ? ?Indications: Patient is a 76 year old female who previously underwent a right common femoral to below-knee popliteal bypass with vein graft for rest pain in the right foot.  She was seen in the office recently for surveillance with evidence of stenosis both at the proximal and distal anastomosis greater than 70% in the vein graft.  She presents today for lower extremity arteriogram and possible intervention after risk benefits discussed. ? ?Findings:  ? ?Aortogram showed patent renal arteries bilaterally with no evidence of flow-limiting stenosis in the aortoiliac segment.  Right lower extremity arteriogram showed a patent common femoral and profunda although these are small caliber vessels.  The vein graft is much larger and there is a caliber change at the proximal anastomosis onto the common femoral artery given a much larger vein graft compared to the common femoral artery but no apparent stenosis was visualized.  The right lower extremity bypass was patent except for the distal bypass adjacent to the below-knee popliteal artery where there was a focal high-grade stenosis greater than 80%.  Two-vessel runoff in the anterior  tibial and peroneal artery. ? ?The distal anastomosis of the vein graft onto the below knee popliteal artery was angioplastied with a 4 mm x 20 mm Mustang and 4 mm x 40 mm drug-coated Ranger with excellent results and no residual stenosis.  Preserved two-vessel runoff. ?  ?Procedure:  The patient was identified in the holding area and taken to room 8.  The patient was then placed supine on the table and prepped and draped in the usual sterile fashion.  A time out was called.  Ultrasound was used to evaluate the left common femoral artery.  It was patent .  A digital ultrasound image was acquired.  A micropuncture needle was used to access the left common femoral artery under ultrasound guidance.  An 018 wire was advanced without resistance and a micropuncture sheath was placed.  The 018 wire was removed and a benson wire was placed.  The micropuncture sheath was exchanged for a 5 french sheath.  An omniflush catheter was advanced over the wire to the level of L-1.  An abdominal aortogram was obtained.  Pertinent findings are noted above.  Used the Omni Flush catheter to cross the aortic bifurcation and the catheter was placed in the right distal external iliac artery.  Right lower extremity arteriogram was then obtained.  After evaluating images, we saw a high-grade stenosis in the distal bypass vein graft adjacent to the below-knee popliteal artery.  I then got a Bentson wire into the bypass and exchanged for a long 5 French sheath in the left groin over the aortic bifurcation.  Patient was given 100 units/kg IV heparin.  I then used a V18 with a long guide catheter to get across the  distal anastomosis into the peroneal artery.  We then got dedicated imaging to identify the lesion.  The distal bypass onto the below-knee popliteal artery was then angioplastied with a 4 mm x 20 mm Mustang to nominal pressure for 2 minutes.  There was good results with no residual stenosis.  I then used a drug-coated balloon using a 4  mm x 40 mm Ranger to nominal pressure for 3 minutes.  Excellent results with preserved runoff in the anterior tibial and peroneal artery and no residual stenosis in the distal bypass.  I did go back to the proximal anastomosis given concern on duplex and looked at this in multiple angles including a steep oblique imaging and could not visualize any stenosis in the proximal bypass.  There is a definite caliber change given a much smaller common femoral artery compared to the vein graft.  No intervention was performed here.  Wires and catheters were removed.  The sheath was pulled over the aortic bifurcation.  I did not use a closure device given small left common femoral artery with common femoral disease.  Taken to holding in stable condition. ? ? ?Plan: Patient will be loaded on Plavix (aspirin allergy).  We will arrange follow-up in 1 month with right lower extremity arterial duplex. ? ?[name], MD ?[facility] ?Office: [phone] ? ? ?

## 2021-05-07 NOTE — Progress Notes (Signed)
Site area: left groin arterial sheath pulled by Elza Rafter ?Site Prior to Removal:  Level 0 ?Pressure Applied For: 20 minutes ?Manual:   yes ?Patient Status During Pull:  stable ?Post Pull Site:  Level 0 ?Post Pull Instructions Given:  yes ?Post Pull Pulses Present: left dp and pt dopplered ?Dressing Applied:  gauze and tegaderm ?Bedrest begins @ 1305 ?Comments: ?  ?

## 2021-05-08 ENCOUNTER — Encounter (HOSPITAL_COMMUNITY): Payer: Self-pay | Admitting: Vascular Surgery

## 2021-05-26 ENCOUNTER — Encounter: Payer: Self-pay | Admitting: Neurology

## 2021-05-26 ENCOUNTER — Other Ambulatory Visit: Payer: Self-pay

## 2021-05-26 ENCOUNTER — Ambulatory Visit: Payer: Medicare HMO | Admitting: Neurology

## 2021-05-26 VITALS — BP 142/79 | HR 94 | Resp 18 | Ht 65.0 in | Wt 187.0 lb

## 2021-05-26 DIAGNOSIS — G40009 Localization-related (focal) (partial) idiopathic epilepsy and epileptic syndromes with seizures of localized onset, not intractable, without status epilepticus: Secondary | ICD-10-CM

## 2021-05-26 MED ORDER — LEVETIRACETAM 500 MG PO TABS: 500.0000 mg | ORAL_TABLET | Freq: Two times a day (BID) | ORAL | 3 refills | Status: DC

## 2021-05-26 NOTE — Progress Notes (Signed)
? ?NEUROLOGY FOLLOW UP OFFICE NOTE ? ?Heather Horton ?[id number] ?[birth year] ? ?HISTORY OF PRESENT ILLNESS: ?I had the pleasure of seeing Heather Horton in follow-up in the neurology clinic on 05/26/2021.  The patient was last seen 6 months ago for seizures and mild cognitive impairment. She is again accompanied by her daughter Heather Horton who helps supplement the history today.  Records and images were personally reviewed where available.  She continues to do well from a seizure standpoint with no seizures since 2019 on Levetiracetam '500mg'$  BID. No staring/unresponsive episodes, gaps in time, olfactory/gustatory hallucinations,  myoclonic jerks. She has been recovering well from recent vascular surgery. She had tingling in her left hand, at times with twitching, she is being treated for CTS with injections which help some. She feels her memory is good, "off and on like old people." Her daughter manages medications, finances, she does not drive. Her daughter is on standby assist for showers. Some nights she sleeps halfway decent, majority of the time she is up and down. Her PCP prescribes Seroquel '300mg'$  qhs. She takes occasional naps during the day. Heather Horton reports she is not active during the day. No falls.  ? ? ? ? ?History on Initial Assessment 06/28/2017: This is a 76 year old right-handed woman with a history of hypertension, hyperlipidemia, hepatitis C, cervical cancer, presenting for evaluation of possible seizure. She was [facility] to [facility] last [facility]/20/2017 for altered mental status. She lives alone and recalls getting ready for bed feeling fine, then waking up in the hospital. Her daughter visits her daily except on weekends, she had seen her Friday and spoke to her on the phone the day prior. Her daughter came Sunday noon and found her naked on the floor. She had fallen into a table in the den and daughter found her moaning with eyes rolling back, her right leg and arm were "flinching." They found her bed clothes folded on  top of the bed. No tongue bite or incontinence. Her daughter feels she was down for a long time because her lips were so dry. She was brought to [facility] where CBC showed WBC 10.9, MCV 101.5, CMP showed mildly elevated creatinine 1.04, CK level was elevated at 1528. UDS was positive for opioids. She had an MRI brain with and without contrast which I personally reviewed, no acute changes. EEG showed moderate diffuse background slowing, no epileptiform discharges. It was noted that symptoms were most likely resolving toxic metabolic encephalopathy in the setting of opiate overuse, however since unclear if seizure-related, she was continued on Keppra '500mg'$  BID. ?  ?Since hospital discharge, her daughter reports she is back to baseline. Her daughter now administers medications. Her daughter reports that prior to hospitalization, she was taking Tramadol, Tizanidine, and Trazodone. There is note of hydrocodone, however her daughter did not find any hydrocodone bottle in the house. She states she has not taken it that day. She is now on Tramadol TID which does not help with pain from fibromyalgia and osteoarthritis. She is scheduled to see Pain Management. Her daughter denies any staring/unresponsive episodes, she denies any other gaps in time, olfactory/gustatory hallucinations, myoclonic jerks. She has tingling in her left arm and both feet L>R with associated pain. She has swelling in both knees. She does not drive. She has a history of spinal meningitis at age 57. Otherwise she had a normal birth and early development.  There is no history of febrile convulsions, significant traumatic brain injury, neurosurgical procedures, or family history of seizures. ? ?Diagnostic  Data:  ?MRI brain in 03/2017 was unremarkable ?MRI brain in 12/2019 was unremarkable ? ?EEG in 03/2017 showed moderate diffuse slowing ?24-hour EEG in June 2019 which was abnormal, showing occasional left mid-temporal epileptiform discharges during sleep,  typical events were not captured.  ? ? ?PAST MEDICAL HISTORY: ?Past Medical History:  ?Diagnosis Date  ? Acute encephalopathy   ? Allergy   ? Anemia   ? Anxiety   ? Arthritis   ? Asthma   ? Blood transfusion without reported diagnosis   ? Bronchitis   ? Bursitis   ? Cervical cancer ([place])   ? Chronic constipation   ? Chronic hepatitis C without hepatic coma ([place]) 10/17/2014  ? 11/10/2015 visit with Dr. Linus Salmons. Repeat labs revealed resolution of infection following treatment.  [place], , FNP-C; last seen 01/2014 Genotype 1b Diagnosed 01/2013  ? Chronic kidney disease   ? stage 3  ? Colon polyps   ? Family history of adverse reaction to anesthesia   ? daughter post-op nausea and vomiting  ? Fibrocystic breast disease   ? Fibromyalgia   ? diagnosed years ago per pt  ? Galactorrhea on right side   ? GERD (gastroesophageal reflux disease)   ? Headache   ? migraines in the past  ? Hepatitis C   ? Hyperlipidemia   ? Hypertension   ? Insomnia   ? Neuromuscular disorder ([place])   ? Osteoporosis   ? Respiratory failure ([place]) 03/20/2017  ? Seizures ([place])   ? ? ?MEDICATIONS: ?Current Outpatient Medications on File Prior to Visit  ?Medication Sig Dispense Refill  ? albuterol (PROVENTIL HFA;VENTOLIN HFA) 108 (90 Base) MCG/ACT inhaler Inhale 2 puffs into the lungs every 6 (six) hours as needed for wheezing. 1 Inhaler 1  ? atorvastatin (LIPITOR) 40 MG tablet Take 1 tablet (40 mg total) by mouth daily. 30 tablet 11  ? busPIRone (BUSPAR) 5 MG tablet Take 5 mg by mouth 3 (three) times daily.    ? Capsaicin (MUSCLE RELIEF EX) Apply 1 application topically daily as needed (muscle cramps).    ? clopidogrel (PLAVIX) 75 MG tablet Take 1 tablet (75 mg total) by mouth daily. 30 tablet 11  ? diclofenac Sodium (VOLTAREN) 1 % GEL Apply 2 g topically 4 (four) times daily. (Patient taking differently: Apply 2 g topically daily as needed (Pain).) 50 g 1  ? ergocalciferol (VITAMIN D2) 1.25 MG (50000  UT) capsule Take 50,000 Units by mouth every Wednesday.     ? gabapentin (NEURONTIN) 300 MG capsule Take 300-600 mg by mouth See admin instructions. Take 300 mg morning and noon and 600 mg at bedtime    ? hydrochlorothiazide (HYDRODIURIL) 25 MG tablet Take 25 mg by mouth every morning.    ? HYDROcodone-acetaminophen (NORCO) 10-325 MG tablet Take 1 tablet by mouth in the morning, at noon, and at bedtime.    ? ketoconazole (NIZORAL) 2 % cream Apply 1 application topically daily as needed for irritation.    ? levETIRAcetam (KEPPRA) 500 MG tablet TAKE 1 TABLET BY MOUTH TWICE A DAY 120 tablet 0  ? loratadine (CLARITIN) 10 MG tablet Take 10 mg by mouth daily as needed for allergies or rhinitis.    ? pantoprazole (PROTONIX) 40 MG tablet Take 40 mg by mouth at bedtime.    ? QUEtiapine (SEROQUEL) 300 MG tablet Take 1 tablet (300 mg total) by mouth at bedtime. 90 tablet 3  ? tetrahydrozoline-zinc (VISINE-AC) 0.05-0.25 % ophthalmic solution Place 1-2  drops into both eyes 3 (three) times daily as needed (for irritation).     ? ?No current facility-administered medications on file prior to visit.  ? ? ?ALLERGIES: ?Allergies  ?Allergen Reactions  ? Latex Itching  ? Molds & Smuts Other (See Comments)  ?  Per allergy test  ? Pollen Extract Other (See Comments)  ?  Per allergy test  ? Aspirin Nausea And Vomiting and Rash  ? ? ?FAMILY HISTORY: ?Family History  ?Problem Relation Age of Onset  ? Asthma Mother   ? Colon cancer Sister 52  ? Stroke Brother 8  ? Stroke Sister 64  ? Cancer Sister 37  ?     lung cancer, +TOBACCO  ? Arthritis Daughter   ? Pulmonary embolism Daughter 67  ?     on chronic warfarin  ? Hypertension Son 71  ? Esophageal cancer Neg Hx   ? Rectal cancer Neg Hx   ? Stomach cancer Neg Hx   ? ? ?SOCIAL HISTORY: ?Social History  ? ?Socioeconomic History  ? Marital status: Divorced  ?  Spouse name: Not on file  ? Number of children: 3  ? Years of education: 14+  ? Highest education level: Not on file  ?Occupational  History  ? Occupation: retired  ?  Comment: phlebotomy, EKG tech; taught both  ?Tobacco Use  ? Smoking status: Every Day  ?  Packs/day: 0.50  ?  Years: 40.00  ?  Pack years: 20.00  ?  Types: Cigarettes  ?  [place]

## 2021-05-26 NOTE — Patient Instructions (Signed)
Always good to see you. Continue Levetiracetam (Keppra) '500mg'$  twice a day. Follow-up in 1 year, call for any changes.  ? ?Seizure Precautions: ?1. If medication has been prescribed for you to prevent seizures, take it exactly as directed.  Do not stop taking the medicine without talking to your doctor first, even if you have not had a seizure in a long time.  ? ?2. Avoid activities in which a seizure would cause danger to yourself or to others.  Don't operate dangerous machinery, swim alone, or climb in high or dangerous places, such as on ladders, roofs, or girders.  Do not drive unless your doctor says you may. ? ?3. If you have any warning that you may have a seizure, lay down in a safe place where you can't hurt yourself.   ? ?4.  No driving for 6 months from last seizure, as per [facility].   Please refer to the following link on the [place] website for more information: http://[email or web address]/[email or web address]/drivingu.cfm  ? ?5.  Maintain good sleep hygiene. Avoid alcohol. ? ?6.  Contact your doctor if you have any problems that may be related to the medicine you are taking. ? ?7.  Call 911 and bring the patient back to the ED if: ?      ? A.  The seizure lasts longer than 5 minutes.      ? B.  The patient doesn't awaken shortly after the seizure ? C.  The patient has new problems such as difficulty seeing, speaking or moving ? D.  The patient was injured during the seizure ? E.  The patient has a temperature over 102 F (39C) ? F.  The patient vomited and now is having trouble breathing ?      ? ?

## 2021-05-28 ENCOUNTER — Encounter: Payer: Self-pay | Admitting: Neurology

## 2021-06-02 ENCOUNTER — Other Ambulatory Visit: Payer: Self-pay

## 2021-06-02 DIAGNOSIS — I739 Peripheral vascular disease, unspecified: Secondary | ICD-10-CM

## 2021-06-16 ENCOUNTER — Encounter (HOSPITAL_COMMUNITY): Payer: Medicare HMO

## 2021-06-16 ENCOUNTER — Encounter: Payer: Medicare HMO | Admitting: Vascular Surgery

## 2021-06-30 IMAGING — MR MR HEAD W/O CM
11 series · 48 of 48 positions shown · non-contrast
Comparison: Head CT 01/26/2020 and MRI 03/21/2017

CLINICAL DATA: Memory loss.

EXAM:
MRI HEAD WITHOUT CONTRAST
TECHNIQUE: Multiplanar, multiecho pulse sequences of the brain and surrounding
structures were obtained without intravenous contrast.

[Series 5: DWI · axial · 3.0mm · 0.88mm/px · z∈[-121,+19]mm · 10 of 95 slices shown (1 of 4)]
[im 1/95]
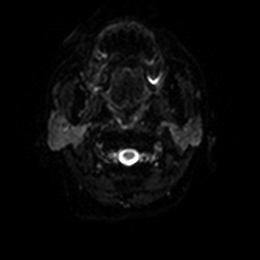
[im 11/95]
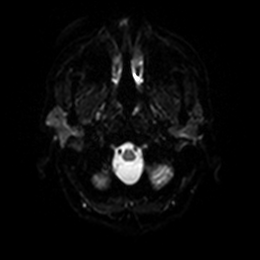
[im 21/95]
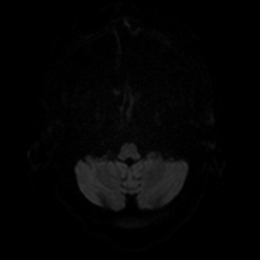
[im 32/95]
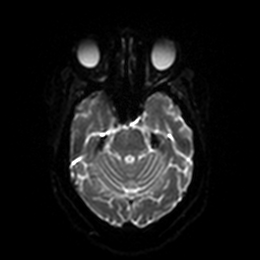
[im 42/95]
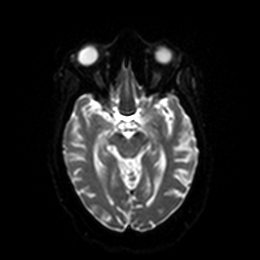
[im 53/95]
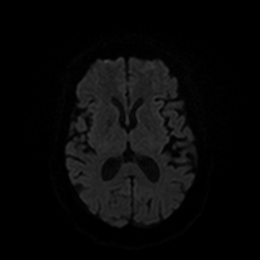
[im 63/95]
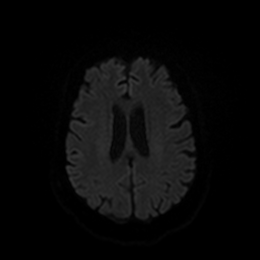
[im 74/95]
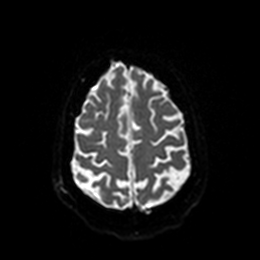
[im 84/95]
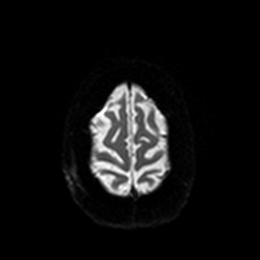
[im 95/95]
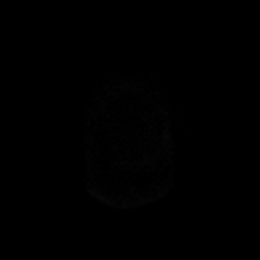

[Series 6: DWI · axial · 3.0mm · 0.88mm/px · z∈[-121,+19]mm · 4 of 48 slices shown (2 of 4)]
[im 1/48]
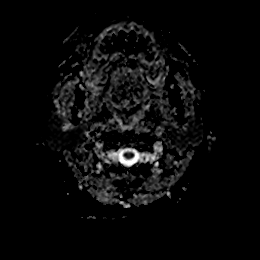
[im 16/48]
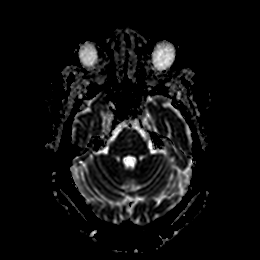
[im 32/48]
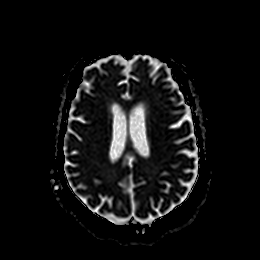
[im 48/48]
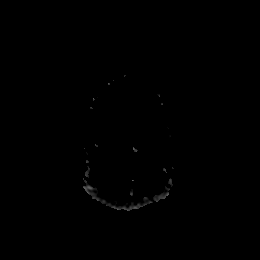

[Series 7: DWI · coronal · 4.0mm · 0.88mm/px · 6 of 64 slices shown (3 of 4)]
[im 1/64]
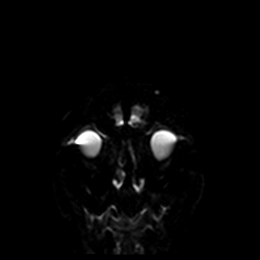
[im 13/64]
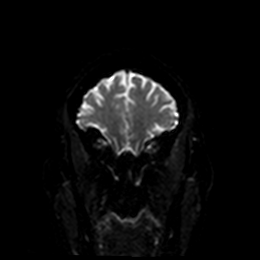
[im 26/64]
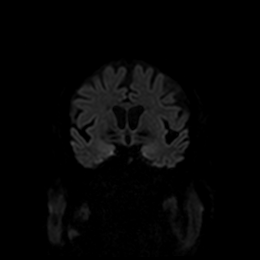
[im 38/64]
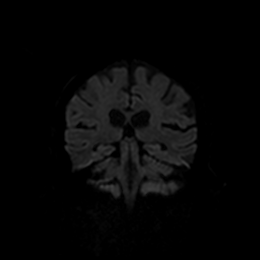
[im 51/64]
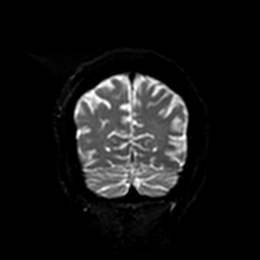
[im 64/64]
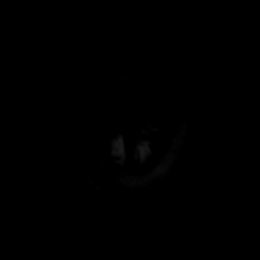

[Series 8: DWI · coronal · 4.0mm · 0.88mm/px · 3 of 32 slices shown (4 of 4)]
[im 1/32]
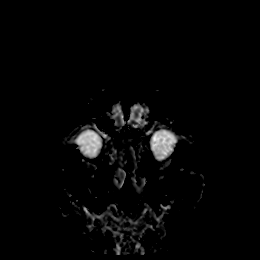
[im 16/32]
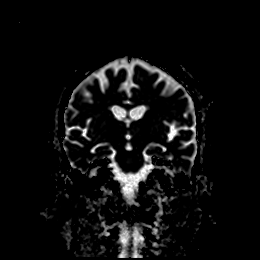
[im 32/32]
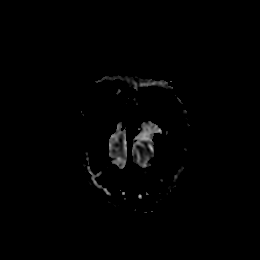

[Series 9: T1 · sagittal · 5.0mm · 0.75mm/px · 2 of 23 slices shown]
[im 1/23]
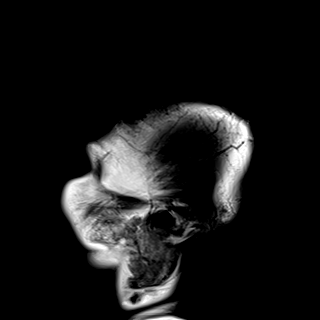
[im 23/23]
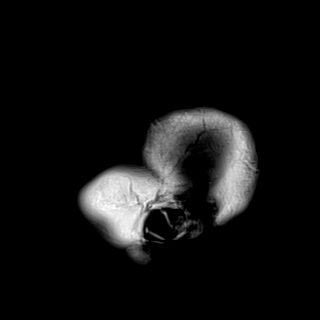

[Series 10: T2 · axial · 5.0mm · 0.72mm/px · z∈[-121,+22]mm · 2 of 25 slices shown]
[im 1/25]
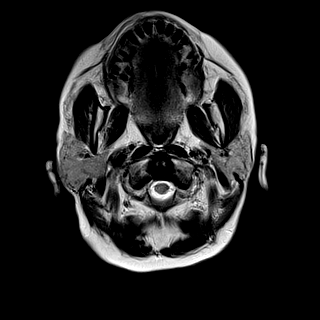
[im 25/25]
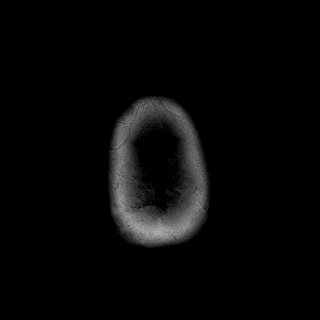

[Series 11: FLAIR · axial · 5.0mm · 0.45mm/px · z∈[-119,+24]mm · 2 of 25 slices shown]
[im 1/25]
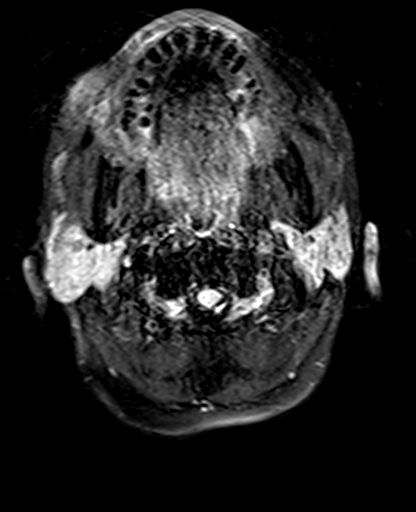
[im 25/25]
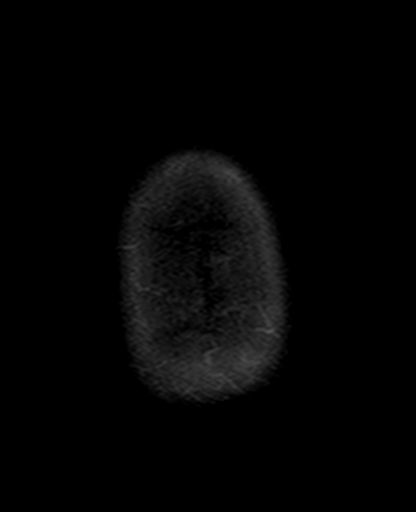

[Series 12: mag_images · axial · 3.0mm · 0.90mm/px · z∈[-134,+30]mm · 5 of 56 slices shown]
[im 1/56]
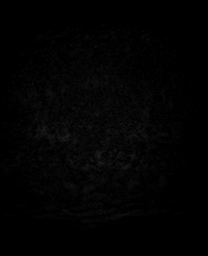
[im 14/56]
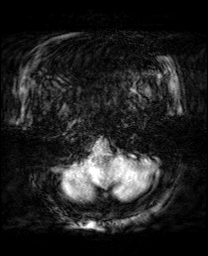
[im 28/56]
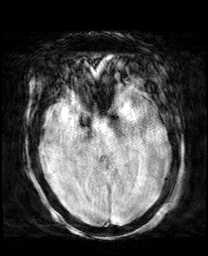
[im 42/56]
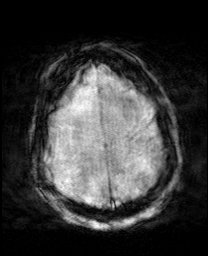
[im 56/56]
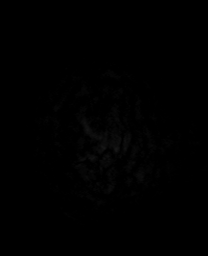

[Series 13: pha_images · axial · 3.0mm · 0.90mm/px · z∈[-134,+30]mm · 5 of 52 slices shown]
[im 1/52]
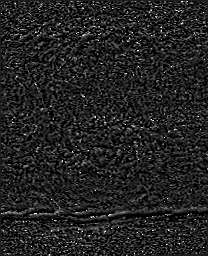
[im 13/52]
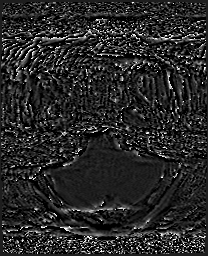
[im 26/52]
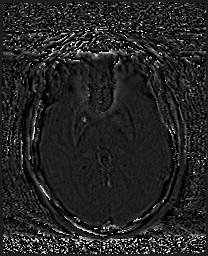
[im 39/52]
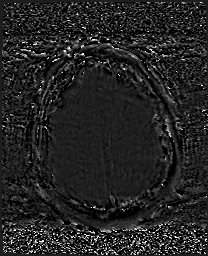
[im 52/52]
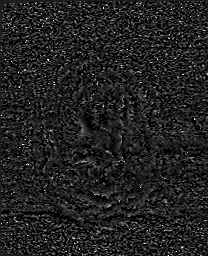

[Series 14: swi_images · axial · 3.0mm · 0.90mm/px · z∈[-134,+30]mm · 5 of 56 slices shown]
[im 1/56]
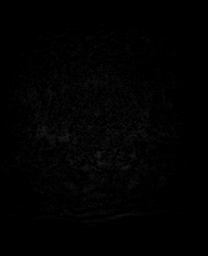
[im 14/56]
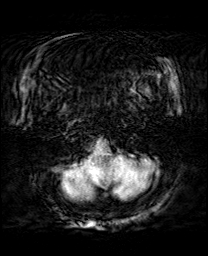
[im 28/56]
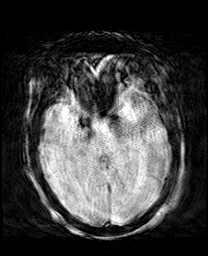
[im 42/56]
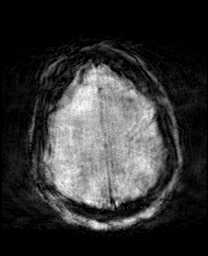
[im 56/56]
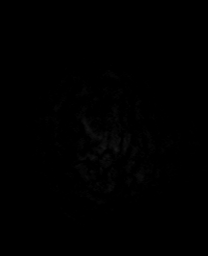

[Series 15: mip_images(sw) · axial · 24.0mm · 0.90mm/px · z∈[-123,+20]mm · 4 of 49 slices shown]
[im 1/49]
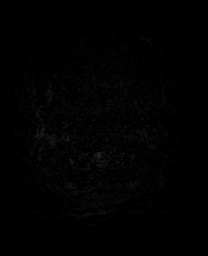
[im 17/49]
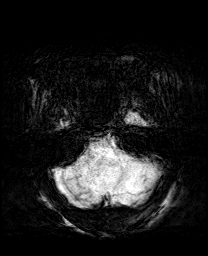
[im 33/49]
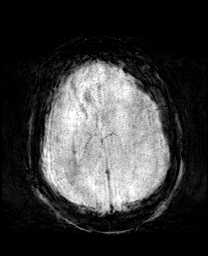
[im 49/49]
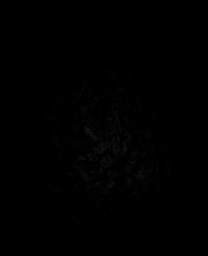

[48 of 48 positions shown; findings below may reference images not displayed]

FINDINGS: The patient was unable to tolerate the examination. A complete
noncontrast examination was obtained with the exception of axial T1
and coronal T2 sequences. No contrast was administered, and there is
intermittent mild to moderate motion artifact.

Brain: There is no evidence of an acute infarct, intracranial
hemorrhage, mass, midline shift, or extra-axial fluid collection.
Mild cerebral atrophy is within normal limits for age. No
significant white matter disease is seen for age.

Vascular: Major intracranial vascular flow voids are preserved.

Skull and upper cervical spine: Unremarkable bone marrow signal.

Sinuses/Orbits: Unremarkable orbits. Paranasal sinuses and mastoid
air cells are clear.

Other: None.
IMPRESSION: Unremarkable appearance of the brain for age on this incomplete,
motion degraded examination.

## 2021-07-14 ENCOUNTER — Ambulatory Visit (INDEPENDENT_AMBULATORY_CARE_PROVIDER_SITE_OTHER): Payer: Medicare HMO | Admitting: Vascular Surgery

## 2021-07-14 ENCOUNTER — Encounter: Payer: Self-pay | Admitting: Vascular Surgery

## 2021-07-14 ENCOUNTER — Ambulatory Visit (INDEPENDENT_AMBULATORY_CARE_PROVIDER_SITE_OTHER)
Admission: RE | Admit: 2021-07-14 | Discharge: 2021-07-14 | Disposition: A | Discharge status: Home / Self Care | Payer: Medicare HMO | Source: Ambulatory Visit | Attending: Vascular Surgery | Admitting: Vascular Surgery

## 2021-07-14 ENCOUNTER — Ambulatory Visit (HOSPITAL_COMMUNITY)
Admission: RE | Admit: 2021-07-14 | Discharge: 2021-07-14 | Disposition: A | Discharge status: Home / Self Care | Payer: Medicare HMO | Source: Ambulatory Visit | Attending: Vascular Surgery | Admitting: Vascular Surgery

## 2021-07-14 VITALS — BP 144/82 | HR 89 | Temp 97.6°F | Resp 16 | Ht 65.0 in | Wt 183.0 lb

## 2021-07-14 DIAGNOSIS — I739 Peripheral vascular disease, unspecified: Secondary | ICD-10-CM

## 2021-07-14 NOTE — Progress Notes (Signed)
? ?Patient name: Heather Horton MRN: [id number] DOB: [birth year] Sex: female ? ?REASON FOR VISIT: Follow-up after recent intervention on right leg bypass ? ?HPI: ?Heather Horton is a 76 y.o. female with multiple comorbidities that presents for follow-up after recent intervention on right leg bypass.  She previous underwent a right common femoral to below-knee popliteal bypass with ipsilateral nonreversed saphenous vein on 09/03/2020 in the setting of rest pain.  She was found to have stenosis at both the inflow and outflow of her bypass (proximal and distal anastmosis) and underwent angiogram on 05/07/2021.  She underwent angioplasty of a distal 80% stenosis in the bypass with a drug-coated balloon.  We did not appreciate any significant stenosis in the proximal bypass although there was some common femoral disease that was not flow-limiting.  She is taking her Plavix since angioplasty of the distal bypass.  No new complaints.  Still smoking. ? ?Past Medical History:  ?Diagnosis Date  ? Acute encephalopathy   ? Allergy   ? Anemia   ? Anxiety   ? Arthritis   ? Asthma   ? Blood transfusion without reported diagnosis   ? Bronchitis   ? Bursitis   ? Cervical cancer ([place])   ? Chronic constipation   ? Chronic hepatitis C without hepatic coma ([place]) 10/17/2014  ? 11/10/2015 visit with Dr. Linus Salmons. Repeat labs revealed resolution of infection following treatment.  [place], [place], FNP-C; last seen 01/2014 Genotype 1b Diagnosed 01/2013  ? Chronic kidney disease   ? stage 3  ? Colon polyps   ? Family history of adverse reaction to anesthesia   ? daughter post-op nausea and vomiting  ? Fibrocystic breast disease   ? Fibromyalgia   ? diagnosed years ago per pt  ? Galactorrhea on right side   ? GERD (gastroesophageal reflux disease)   ? Headache   ? migraines in the past  ? Hepatitis C   ? Hyperlipidemia   ? Hypertension   ? Insomnia   ? Neuromuscular disorder ([place])   ? Osteoporosis   ?  Respiratory failure ([place]) 03/20/2017  ? Seizures ([place])   ? ? ?Past Surgical History:  ?Procedure Laterality Date  ? ABDOMINAL AORTOGRAM W/LOWER EXTREMITY Right 08/14/2020  ? Procedure: ABDOMINAL AORTOGRAM W/LOWER EXTREMITY;  Surgeon: [name], MD;  Location: [place] [facility];  Service: Cardiovascular;  Laterality: Right;  ? ABDOMINAL AORTOGRAM W/LOWER EXTREMITY N/A 05/07/2021  ? Procedure: ABDOMINAL AORTOGRAM W/LOWER EXTREMITY;  Surgeon: [name], MD;  Location: [place] [facility];  Service: Cardiovascular;  Laterality: N/A;  ? ABDOMINAL HYSTERECTOMY  30's  ? due to cervical cancer  ? APPENDECTOMY    ? BIOPSY  01/29/2020  ? Procedure: BIOPSY;  Surgeon: Rush Landmark Telford Nab., MD;  Location: [place];  Service: Gastroenterology;;  ? DILATION AND CURETTAGE OF UTERUS    ? pt says years ago when living in [place]  ? ESOPHAGOGASTRODUODENOSCOPY (EGD) WITH PROPOFOL N/A 01/29/2020  ? Procedure: ESOPHAGOGASTRODUODENOSCOPY (EGD) WITH PROPOFOL;  Surgeon: Rush Landmark Telford Nab., MD;  Location: [place];  Service: Gastroenterology;  Laterality: N/A;  ? FEMORAL-POPLITEAL BYPASS GRAFT Right 09/03/2020  ? Procedure: BYPASS GRAFT FEMORAL-POPLITEAL ARTERY USING NON-REVERSED GREATER SAPHENOUS VEIN RIGHT;  Surgeon: [name], MD;  Location: [place];  Service: Vascular;  Laterality: Right;  ? PERIPHERAL VASCULAR BALLOON ANGIOPLASTY  05/07/2021  ? Procedure: PERIPHERAL VASCULAR BALLOON ANGIOPLASTY;  Surgeon: [name], MD;  Location: [place] [facility];  Service: Cardiovascular;;  Distal Bypass  ? POLYPECTOMY    ? SAVORY DILATION N/A 01/29/2020  ? Procedure: [place] Setting DILATION;  Surgeon: Irving Copas., MD;  Location: [place];  Service: Gastroenterology;  Laterality: N/A;  ? ? ?Family History  ?Problem Relation Age of Onset  ? Asthma Mother   ? Colon cancer Sister 56  ? Stroke Brother 88  ? Stroke Sister 78  ? Cancer Sister 30  ?     lung cancer, +TOBACCO  ? Arthritis Daughter    ? Pulmonary embolism Daughter 80  ?     on chronic warfarin  ? Hypertension Son 77  ? Esophageal cancer Neg Hx   ? Rectal cancer Neg Hx   ? Stomach cancer Neg Hx   ? ? ?SOCIAL HISTORY: ?Social History  ? ?Tobacco Use  ? Smoking status: Every Day  ?  Packs/day: 0.50  ?  Years: 40.00  ?  Pack years: 20.00  ?  Types: Cigarettes  ?  Passive exposure: Never  ? Smokeless tobacco: Never  ? Tobacco comments:  ?  I'm scared of e-cigarettes, interested in patches, tobacco info given 01/10/15  ?Substance Use Topics  ? Alcohol use: Yes  ?  Alcohol/week: 2.0 standard drinks  ?  Types: 2 Glasses of wine per week  ? ? ?Allergies  ?Allergen Reactions  ? Latex Itching  ? Molds & Smuts Other (See Comments)  ?  Per allergy test  ? Pollen Extract Other (See Comments)  ?  Per allergy test  ? Aspirin Nausea And Vomiting and Rash  ? ? ?Current Outpatient Medications  ?Medication Sig Dispense Refill  ? albuterol (PROVENTIL HFA;VENTOLIN HFA) 108 (90 Base) MCG/ACT inhaler Inhale 2 puffs into the lungs every 6 (six) hours as needed for wheezing. 1 Inhaler 1  ? atorvastatin (LIPITOR) 40 MG tablet Take 1 tablet (40 mg total) by mouth daily. 30 tablet 11  ? busPIRone (BUSPAR) 5 MG tablet Take 5 mg by mouth 3 (three) times daily.    ? Capsaicin (MUSCLE RELIEF EX) Apply 1 application topically daily as needed (muscle cramps).    ? clopidogrel (PLAVIX) 75 MG tablet Take 1 tablet (75 mg total) by mouth daily. 30 tablet 11  ? diclofenac Sodium (VOLTAREN) 1 % GEL Apply 2 g topically 4 (four) times daily. (Patient taking differently: Apply 2 g topically daily as needed (Pain).) 50 g 1  ? ergocalciferol (VITAMIN D2) 1.25 MG (50000 UT) capsule Take 50,000 Units by mouth every Wednesday.     ? gabapentin (NEURONTIN) 300 MG capsule Take 300-600 mg by mouth See admin instructions. Take 300 mg morning and noon and 600 mg at bedtime    ? hydrochlorothiazide (HYDRODIURIL) 25 MG tablet Take 25 mg by mouth every morning.    ? HYDROcodone-acetaminophen (NORCO)  10-325 MG tablet Take 1 tablet by mouth in the morning, at noon, and at bedtime.    ? ketoconazole (NIZORAL) 2 % cream Apply 1 application topically daily as needed for irritation.    ? levETIRAcetam (KEPPRA) 500 MG tablet Take 1 tablet (500 mg total) by mouth 2 (two) times daily. 180 tablet 3  ? loratadine (CLARITIN) 10 MG tablet Take 10 mg by mouth daily as needed for allergies or rhinitis.    ? pantoprazole (PROTONIX) 40 MG tablet Take 40 mg by mouth at bedtime.    ? QUEtiapine (SEROQUEL) 300 MG tablet Take 1 tablet (300 mg total) by mouth at bedtime. 90 tablet 3  ? tetrahydrozoline-zinc (VISINE-AC) 0.05-0.25 % ophthalmic solution  Place 1-2 drops into both eyes 3 (three) times daily as needed (for irritation).     ? ?No current facility-administered medications for this visit.  ? ? ?REVIEW OF SYSTEMS:  ?'[X]'$  denotes positive finding, '[ ]'$  denotes negative finding ?Cardiac  Comments:  ?Chest pain or chest pressure:    ?Shortness of breath upon exertion:    ?Short of breath when lying flat:    ?Irregular heart rhythm:    ?    ?Vascular    ?Pain in calf, thigh, or hip brought on by ambulation:    ?Pain in feet at night that wakes you up from your sleep:     ?Blood clot in your veins:    ?Leg swelling:     ?    ?Pulmonary    ?Oxygen at home:    ?Productive cough:     ?Wheezing:     ?    ?Neurologic    ?Sudden weakness in arms or legs:     ?Sudden numbness in arms or legs:     ?Sudden onset of difficulty speaking or slurred speech:    ?Temporary loss of vision in one eye:     ?Problems with dizziness:     ?    ?Gastrointestinal    ?Blood in stool:     ?Vomited blood:     ?    ?Genitourinary    ?Burning when urinating:     ?Blood in urine:    ?    ?Psychiatric    ?Major depression:     ?    ?Hematologic    ?Bleeding problems:    ?Problems with blood clotting too easily:    ?    ?Skin    ?Rashes or ulcers:    ?    ?Constitutional    ?Fever or chills:    ? ? ?PHYSICAL EXAM: ?Vitals:  ? 07/14/21 1427  ?BP: (!) 144/82   ?Pulse: 89  ?Resp: 16  ?Temp: 97.6 ?F (36.4 ?C)  ?TempSrc: Temporal  ?SpO2: 94%  ?Weight: 183 lb (83 kg)  ?Height: '5\' 5"'$  (1.651 m)  ? ? ?GENERAL: The patient is a well-nourished female, in no acute distress. Terie Purser

## 2021-07-22 ENCOUNTER — Other Ambulatory Visit: Payer: Self-pay | Admitting: *Deleted

## 2021-07-22 DIAGNOSIS — I739 Peripheral vascular disease, unspecified: Secondary | ICD-10-CM

## 2021-07-24 ENCOUNTER — Other Ambulatory Visit: Payer: Self-pay | Admitting: Physician Assistant

## 2021-07-24 DIAGNOSIS — Z1231 Encounter for screening mammogram for malignant neoplasm of breast: Secondary | ICD-10-CM

## 2021-08-23 ENCOUNTER — Other Ambulatory Visit: Payer: Self-pay | Admitting: Vascular Surgery

## 2021-08-28 ENCOUNTER — Ambulatory Visit
Admission: RE | Admit: 2021-08-28 | Discharge: 2021-08-28 | Disposition: A | Discharge status: Home / Self Care | Payer: Medicare HMO | Source: Ambulatory Visit | Attending: Physician Assistant | Admitting: Physician Assistant

## 2021-08-28 DIAGNOSIS — Z1231 Encounter for screening mammogram for malignant neoplasm of breast: Secondary | ICD-10-CM

## 2021-09-14 ENCOUNTER — Other Ambulatory Visit: Payer: Self-pay | Admitting: *Deleted

## 2021-09-14 DIAGNOSIS — I739 Peripheral vascular disease, unspecified: Secondary | ICD-10-CM

## 2021-10-06 ENCOUNTER — Encounter: Payer: Self-pay | Admitting: Vascular Surgery

## 2021-10-06 ENCOUNTER — Ambulatory Visit (INDEPENDENT_AMBULATORY_CARE_PROVIDER_SITE_OTHER)
Admission: RE | Admit: 2021-10-06 | Discharge: 2021-10-06 | Disposition: A | Discharge status: Home / Self Care | Payer: Medicare HMO | Source: Ambulatory Visit | Attending: Vascular Surgery | Admitting: Vascular Surgery

## 2021-10-06 ENCOUNTER — Ambulatory Visit: Payer: Medicare HMO | Admitting: Vascular Surgery

## 2021-10-06 ENCOUNTER — Ambulatory Visit (HOSPITAL_COMMUNITY)
Admission: RE | Admit: 2021-10-06 | Discharge: 2021-10-06 | Disposition: A | Discharge status: Home / Self Care | Payer: Medicare HMO | Source: Ambulatory Visit | Attending: Vascular Surgery | Admitting: Vascular Surgery

## 2021-10-06 VITALS — BP 164/97 | HR 75 | Temp 97.4°F | Resp 14 | Ht 65.0 in | Wt 180.0 lb

## 2021-10-06 DIAGNOSIS — Z95828 Presence of other vascular implants and grafts: Secondary | ICD-10-CM

## 2021-10-06 DIAGNOSIS — I739 Peripheral vascular disease, unspecified: Secondary | ICD-10-CM

## 2021-10-06 NOTE — Progress Notes (Signed)
Patient name: Heather Horton MRN: [id number] DOB: 25-Jul-1945 Sex: female  REASON FOR VISIT: 3 month follow-up  for surveillance after intervention on right leg bypass  HPI: Heather Horton is a 76 y.o. female with multiple comorbidities that presents for 3 month follow-up after intervention on right leg bypass.  She previous underwent a right common femoral to below-knee popliteal bypass with ipsilateral nonreversed saphenous vein on 09/03/2020 in the setting of rest pain.  She was found to have stenosis at both the inflow and outflow of her bypass (proximal and distal anastmosis) and underwent angiogram on 05/07/2021.  She underwent angioplasty of a distal 80% stenosis in the bypass with a drug-coated balloon.  We did not appreciate any significant stenosis in the proximal bypass although there was some common femoral disease that was not flow-limiting.  She is taking her Plavix since angioplasty of the distal bypass.  Still smoking.  Has some cramping in her toes at times but no other complaints today.    Past Medical History:  Diagnosis Date   Acute encephalopathy    Allergy    Anemia    Anxiety    Arthritis    Asthma    Blood transfusion without reported diagnosis    Bronchitis    Bursitis    Cervical cancer ([place])    Chronic constipation    Chronic hepatitis C without hepatic coma ([place]) 10/17/2014   11/10/2015 visit with Dr. Linus Salmons. Repeat labs revealed resolution of infection following treatment.  [facility], Florentina Addison, [place], FNP-C; last seen 01/2014 Genotype 1b Diagnosed 01/2013   Chronic kidney disease    stage 3   Colon polyps    Family history of adverse reaction to anesthesia    daughter post-op nausea and vomiting   Fibrocystic breast disease    Fibromyalgia    diagnosed years ago per pt   Galactorrhea on right side    GERD (gastroesophageal reflux disease)    Headache    migraines in the past   Hepatitis C    Hyperlipidemia     Hypertension    Insomnia    Neuromuscular disorder ([place])    Osteoporosis    Respiratory failure ([place]) 03/20/2017   Seizures ([place])     Past Surgical History:  Procedure Laterality Date   ABDOMINAL AORTOGRAM W/LOWER EXTREMITY Right 08/14/2020   Procedure: ABDOMINAL AORTOGRAM W/LOWER EXTREMITY;  Surgeon: [name], MD;  Location: [facility] [facility];  Service: Cardiovascular;  Laterality: Right;   ABDOMINAL AORTOGRAM W/LOWER EXTREMITY N/A 05/07/2021   Procedure: ABDOMINAL AORTOGRAM W/LOWER EXTREMITY;  Surgeon: [name], MD;  Location: [place] [facility];  Service: Cardiovascular;  Laterality: N/A;   ABDOMINAL HYSTERECTOMY  30's   due to cervical cancer   APPENDECTOMY     BIOPSY  01/29/2020   Procedure: BIOPSY;  Surgeon: Rush Landmark Telford Nab., MD;  Location: [place] [facility];  Service: Gastroenterology;;   DILATION AND CURETTAGE OF UTERUS     pt says years ago when living in [place]   ESOPHAGOGASTRODUODENOSCOPY (EGD) WITH PROPOFOL N/A 01/29/2020   Procedure: ESOPHAGOGASTRODUODENOSCOPY (EGD) WITH PROPOFOL;  Surgeon: Irving Copas., MD;  Location: [place] [facility];  Service: Gastroenterology;  Laterality: N/A;   FEMORAL-POPLITEAL BYPASS GRAFT Right 09/03/2020   Procedure: BYPASS GRAFT FEMORAL-POPLITEAL ARTERY USING NON-REVERSED GREATER SAPHENOUS VEIN RIGHT;  Surgeon: [name], MD;  Location: [facility];  Service: Vascular;  Laterality: Right;   PERIPHERAL VASCULAR BALLOON ANGIOPLASTY  05/07/2021   Procedure: PERIPHERAL VASCULAR BALLOON  ANGIOPLASTY;  Surgeon: [name], MD;  Location: [facility];  Service: Cardiovascular;;  Distal Bypass   POLYPECTOMY     SAVORY DILATION N/A 01/29/2020   Procedure: [place] Setting DILATION;  Surgeon: Irving Copas., MD;  Location: [facility];  Service: Gastroenterology;  Laterality: N/A;    Family History  Problem Relation Age of Onset   Asthma Mother    Colon cancer Sister 80   Stroke Brother 73   Stroke  Sister 63   Cancer Sister 66       lung cancer, +TOBACCO   Arthritis Daughter    Pulmonary embolism Daughter 65       on chronic warfarin   Hypertension Son 34   Esophageal cancer Neg Hx    Rectal cancer Neg Hx    Stomach cancer Neg Hx     SOCIAL HISTORY: Social History   Tobacco Use   Smoking status: Every Day    Packs/day: 0.50    Years: 40.00    Total pack years: 20.00    Types: Cigarettes    Passive exposure: Never   Smokeless tobacco: Never   Tobacco comments:    I'm scared of e-cigarettes, interested in patches, tobacco info given 01/10/15  Substance Use Topics   Alcohol use: Yes    Alcohol/week: 2.0 standard drinks of alcohol    Types: 2 Glasses of wine per week    Allergies  Allergen Reactions   Latex Itching   Molds & Smuts Other (See Comments)    Per allergy test   Pollen Extract Other (See Comments)    Per allergy test   Aspirin Nausea And Vomiting and Rash    Current Outpatient Medications  Medication Sig Dispense Refill   albuterol (PROVENTIL HFA;VENTOLIN HFA) 108 (90 Base) MCG/ACT inhaler Inhale 2 puffs into the lungs every 6 (six) hours as needed for wheezing. 1 Inhaler 1   atorvastatin (LIPITOR) 40 MG tablet TAKE 1 TABLET BY MOUTH EVERY DAY 90 tablet 3   busPIRone (BUSPAR) 5 MG tablet Take 5 mg by mouth 3 (three) times daily.     Capsaicin (MUSCLE RELIEF EX) Apply 1 application topically daily as needed (muscle cramps).     clopidogrel (PLAVIX) 75 MG tablet Take 1 tablet (75 mg total) by mouth daily. 30 tablet 11   diclofenac Sodium (VOLTAREN) 1 % GEL Apply 2 g topically 4 (four) times daily. (Patient taking differently: Apply 2 g topically daily as needed (Pain).) 50 g 1   ergocalciferol (VITAMIN D2) 1.25 MG (50000 UT) capsule Take 50,000 Units by mouth every Wednesday.      gabapentin (NEURONTIN) 300 MG capsule Take 300-600 mg by mouth See admin instructions. Take 300 mg morning and noon and 600 mg at bedtime     hydrochlorothiazide (HYDRODIURIL)  25 MG tablet Take 25 mg by mouth every morning.     HYDROcodone-acetaminophen (NORCO) 10-325 MG tablet Take 1 tablet by mouth in the morning, at noon, and at bedtime.     ketoconazole (NIZORAL) 2 % cream Apply 1 application topically daily as needed for irritation.     levETIRAcetam (KEPPRA) 500 MG tablet Take 1 tablet (500 mg total) by mouth 2 (two) times daily. 180 tablet 3   loratadine (CLARITIN) 10 MG tablet Take 10 mg by mouth daily as needed for allergies or rhinitis.     pantoprazole (PROTONIX) 40 MG tablet Take 40 mg by mouth at bedtime.     QUEtiapine (SEROQUEL) 300 MG tablet Take 1 tablet (  300 mg total) by mouth at bedtime. 90 tablet 3   tetrahydrozoline-zinc (VISINE-AC) 0.05-0.25 % ophthalmic solution Place 1-2 drops into both eyes 3 (three) times daily as needed (for irritation).      No current facility-administered medications for this visit.    REVIEW OF SYSTEMS:  '[X]'$  denotes positive finding, '[ ]'$  denotes negative finding Cardiac  Comments:  Chest pain or chest pressure:    Shortness of breath upon exertion:    Short of breath when lying flat:    Irregular heart rhythm:        Vascular    Pain in calf, thigh, or hip brought on by ambulation:    Pain in feet at night that wakes you up from your sleep:     Blood clot in your veins:    Leg swelling:         Pulmonary    Oxygen at home:    Productive cough:     Wheezing:         Neurologic    Sudden weakness in arms or legs:     Sudden numbness in arms or legs:     Sudden onset of difficulty speaking or slurred speech:    Temporary loss of vision in one eye:     Problems with dizziness:         Gastrointestinal    Blood in stool:     Vomited blood:         Genitourinary    Burning when urinating:     Blood in urine:        Psychiatric    Major depression:         Hematologic    Bleeding problems:    Problems with blood clotting too easily:        Skin    Rashes or ulcers:        Constitutional     Fever or chills:      PHYSICAL EXAM: Vitals:   10/06/21 1539  BP: (!) 164/97  Pulse: 75  Resp: 14  Temp: (!) 97.4 F (36.3 C)  TempSrc: Temporal  SpO2: 94%  Weight: 180 lb (81.6 kg)  Height: '5\' 5"'$  (1.651 m)    GENERAL: The patient is a well-nourished female, in no acute distress. The vital signs are documented above. CARDIAC: There is a regular rate and rhythm.  VASCULAR:  Bilateral femoral pulses palpable Right AT palpable No lower extremity tissue loss PULMONARY: No respiratory distress. ABDOMEN: Soft and non-tender. MUSCULOSKELETAL: There are no major deformities or cyanosis. NEUROLOGIC: No focal weakness or paresthesias are detected. PSYCHIATRIC: The patient has a normal affect.  DATA:   ABIs today are 0.88 on the right and 0.52 on the left   Right leg arterial duplex shows patent bypass with no recurrent flow limting stenosis  Assessment/Plan:  76 year old female presents for 3 month follow-up after recent intervention including angioplasty of an 80% stenosis in her distal right common femoral to below-knee popliteal vein bypass with a 4 mm drug-coated balloon on 05/07/2021.  Bypass looks patent on duplex today.  She has a palpable AT pulse on exam.  I do not see any evidence of recurrent stenosis.  Discussed the importance of smoking cessation and walking therapies.  I will see her in 6 months with right leg arterial duplex and ABIs.  Continue plavix and statin since aspirin allergy.   [name], MD [facility] Office: [phone]

## 2021-10-09 ENCOUNTER — Other Ambulatory Visit: Payer: Self-pay

## 2021-10-09 DIAGNOSIS — I739 Peripheral vascular disease, unspecified: Secondary | ICD-10-CM

## 2021-10-09 DIAGNOSIS — Z95828 Presence of other vascular implants and grafts: Secondary | ICD-10-CM

## 2022-01-05 ENCOUNTER — Other Ambulatory Visit (HOSPITAL_COMMUNITY): Payer: Self-pay

## 2022-01-05 MED ORDER — HYDROCODONE-ACETAMINOPHEN 10-325 MG PO TABS
1.0000 | ORAL_TABLET | Freq: Three times a day (TID) | ORAL | 0 refills | Status: DC | PRN
  Filled 2022-01-05: qty 90, 30d supply, fill #0

## 2022-02-03 ENCOUNTER — Other Ambulatory Visit (HOSPITAL_COMMUNITY): Payer: Self-pay

## 2022-02-03 MED ORDER — HYDROCODONE-ACETAMINOPHEN 10-325 MG PO TABS
1.0000 | ORAL_TABLET | Freq: Three times a day (TID) | ORAL | 0 refills | Status: DC | PRN
  Filled 2022-02-03: qty 90, 30d supply, fill #0

## 2022-03-03 ENCOUNTER — Other Ambulatory Visit (HOSPITAL_COMMUNITY): Payer: Self-pay

## 2022-03-03 MED ORDER — HYDROCODONE-ACETAMINOPHEN 10-325 MG PO TABS
1.0000 | ORAL_TABLET | Freq: Three times a day (TID) | ORAL | 0 refills | Status: DC | PRN
  Filled 2022-03-03: qty 90, 30d supply, fill #0

## 2022-03-04 ENCOUNTER — Other Ambulatory Visit (HOSPITAL_COMMUNITY): Payer: Self-pay

## 2022-03-05 ENCOUNTER — Other Ambulatory Visit (HOSPITAL_COMMUNITY): Payer: Self-pay

## 2022-03-17 ENCOUNTER — Encounter: Payer: Self-pay | Admitting: Neurology

## 2022-03-31 ENCOUNTER — Other Ambulatory Visit (HOSPITAL_COMMUNITY): Payer: Self-pay

## 2022-03-31 MED ORDER — HYDROCODONE-ACETAMINOPHEN 10-325 MG PO TABS
1.0000 | ORAL_TABLET | Freq: Three times a day (TID) | ORAL | 0 refills | Status: AC | PRN
  Filled 2022-03-31 – 2022-04-02 (×2): qty 90, 30d supply, fill #0

## 2022-04-01 ENCOUNTER — Other Ambulatory Visit (HOSPITAL_COMMUNITY): Payer: Self-pay

## 2022-04-02 ENCOUNTER — Other Ambulatory Visit (HOSPITAL_COMMUNITY): Payer: Self-pay

## 2022-04-06 ENCOUNTER — Other Ambulatory Visit (HOSPITAL_COMMUNITY): Payer: Self-pay

## 2022-06-01 ENCOUNTER — Other Ambulatory Visit: Payer: Self-pay | Admitting: *Deleted

## 2022-06-01 DIAGNOSIS — Z95828 Presence of other vascular implants and grafts: Secondary | ICD-10-CM

## 2022-06-04 ENCOUNTER — Ambulatory Visit: Payer: Medicare HMO | Admitting: Neurology

## 2022-06-08 ENCOUNTER — Ambulatory Visit (INDEPENDENT_AMBULATORY_CARE_PROVIDER_SITE_OTHER)
Admission: RE | Admit: 2022-06-08 | Discharge: 2022-06-08 | Disposition: A | Discharge status: Home / Self Care | Payer: Medicare HMO | Source: Ambulatory Visit | Attending: Vascular Surgery | Admitting: Vascular Surgery

## 2022-06-08 ENCOUNTER — Ambulatory Visit: Payer: Medicare HMO | Admitting: Vascular Surgery

## 2022-06-08 ENCOUNTER — Ambulatory Visit (HOSPITAL_COMMUNITY)
Admission: RE | Admit: 2022-06-08 | Discharge: 2022-06-08 | Disposition: A | Discharge status: Home / Self Care | Payer: Medicare HMO | Source: Ambulatory Visit | Attending: Vascular Surgery | Admitting: Vascular Surgery

## 2022-06-08 VITALS — BP 130/86 | HR 83 | Temp 97.8°F | Resp 20 | Ht 65.0 in | Wt 183.4 lb

## 2022-06-08 DIAGNOSIS — Z95828 Presence of other vascular implants and grafts: Secondary | ICD-10-CM

## 2022-06-08 DIAGNOSIS — I70222 Atherosclerosis of native arteries of extremities with rest pain, left leg: Secondary | ICD-10-CM | POA: Diagnosis not present

## 2022-06-08 LAB — VAS US ABI WITH/WO TBI
Left ABI: 0.48
Right ABI: 0.81

## 2022-06-08 NOTE — Progress Notes (Signed)
Patient name: Heather Horton MRN: [id number] DOB: 08/23/1945 Sex: female  REASON FOR VISIT: 6 month follow-up for surveillance right leg bypass  HPI: Heather Horton is a 77 y.o. female with multiple comorbidities that presents for 6 month follow-up for surveillance of her right leg bypass.  She previously underwent a right common femoral to below-knee popliteal bypass with ipsilateral nonreversed saphenous vein on 09/03/2020 in the setting of rest pain.  She was found to have stenosis at both the inflow and outflow of her bypass (proximal and distal anastmosis) and underwent angiogram on 05/07/2021.  She underwent angioplasty of a distal 80% stenosis in the bypass with a drug-coated balloon.  We did not appreciate any significant stenosis in the proximal bypass although there was some common femoral disease that was not flow-limiting.  She is taking her Plavix since angioplasty of the distal bypass.    Today she reports some cramping in her legs at times.  This is sometimes at night and sometimes when she is walking.  No wounds.  Still smoking.  Past Medical History:  Diagnosis Date   Acute encephalopathy    Allergy    Anemia    Anxiety    Arthritis    Asthma    Blood transfusion without reported diagnosis    Bronchitis    Bursitis    Cervical cancer ([facility])    Chronic constipation    Chronic hepatitis C without hepatic coma ([facility]) 10/17/2014   11/10/2015 visit with Dr. Luciana Axe. Repeat labs revealed resolution of infection following treatment.  [facility], Alisia Ferrari, [place], FNP-C; last seen 01/2014 Genotype 1b Diagnosed 01/2013   Chronic kidney disease    stage 3   Colon polyps    Family history of adverse reaction to anesthesia    daughter post-op nausea and vomiting   Fibrocystic breast disease    Fibromyalgia    diagnosed years ago per pt   Galactorrhea on right side    GERD (gastroesophageal reflux disease)    Headache    migraines in the past    Hepatitis C    Hyperlipidemia    Hypertension    Insomnia    Neuromuscular disorder ([facility])    Osteoporosis    Respiratory failure ([facility]) 03/20/2017   Seizures ([facility])     Past Surgical History:  Procedure Laterality Date   ABDOMINAL AORTOGRAM W/LOWER EXTREMITY Right 08/14/2020   Procedure: ABDOMINAL AORTOGRAM W/LOWER EXTREMITY;  Surgeon: Cephus Shelling, MD;  [facility];  Service: Cardiovascular;  Laterality: Right;   ABDOMINAL AORTOGRAM W/LOWER EXTREMITY N/A 05/07/2021   Procedure: ABDOMINAL AORTOGRAM W/LOWER EXTREMITY;  Surgeon: Cephus Shelling, MD;  Location: [facility];  Service: Cardiovascular;  Laterality: N/A;   ABDOMINAL HYSTERECTOMY  30's   due to cervical cancer   APPENDECTOMY     BIOPSY  01/29/2020   Procedure: BIOPSY;  Surgeon: Meridee Score Netty Starring., MD;  Location: [facility];  Service: Gastroenterology;;   DILATION AND CURETTAGE OF UTERUS     pt says years ago when living in [place]   ESOPHAGOGASTRODUODENOSCOPY (EGD) WITH PROPOFOL N/A 01/29/2020   Procedure: ESOPHAGOGASTRODUODENOSCOPY (EGD) WITH PROPOFOL;  Surgeon: Lemar Lofty., MD;  Location: [facility];  Service: Gastroenterology;  Laterality: N/A;   FEMORAL-POPLITEAL BYPASS GRAFT Right 09/03/2020   Procedure: BYPASS GRAFT FEMORAL-POPLITEAL ARTERY USING NON-REVERSED GREATER SAPHENOUS VEIN RIGHT;  Surgeon: Cephus Shelling, MD;  Location: [facility];  Service: Vascular;  Laterality: Right;   PERIPHERAL VASCULAR BALLOON  ANGIOPLASTY  05/07/2021   Procedure: PERIPHERAL VASCULAR BALLOON ANGIOPLASTY;  Surgeon: Cephus Shelling, MD;  [facility];  Service: Cardiovascular;;  Distal Bypass   POLYPECTOMY     SAVORY DILATION N/A 01/29/2020   Procedure: [place]iel Cowboy DILATION;  Surgeon: Lemar Lofty., MD;  [facility] END[facility];  Service: Gastroenterology;  Laterality: N/A;    Family History  Problem Relation Age of Onset   Asthma Mother    Colon cancer Sister  5   Stroke Brother 61   Stroke Sister 46   Cancer Sister 42       lung cancer, +TOBACCO   Arthritis Daughter    Pulmonary embolism Daughter 57       on chronic warfarin   Hypertension Son 42   Esophageal cancer Neg Hx    Rectal cancer Neg Hx    Stomach cancer Neg Hx     SOCIAL HISTORY: Social History   Tobacco Use   Smoking status: Every Day    Packs/day: 0.50    Years: 40.00    Additional pack years: 0.00    Total pack years: 20.00    Types: Cigarettes    Passive exposure: Never   Smokeless tobacco: Never   Tobacco comments:    I'm scared of e-cigarettes, interested in patches, tobacco info given 01/10/15  Substance Use Topics   Alcohol use: Yes    Alcohol/week: 2.0 standard drinks of alcohol    Types: 2 Glasses of wine per week    Allergies  Allergen Reactions   Latex Itching   Molds & Smuts Other (See Comments)    Per allergy test   Pollen Extract Other (See Comments)    Per allergy test   Aspirin Nausea And Vomiting and Rash    Current Outpatient Medications  Medication Sig Dispense Refill   albuterol (PROVENTIL HFA;VENTOLIN HFA) 108 (90 Base) MCG/ACT inhaler Inhale 2 puffs into the lungs every 6 (six) hours as needed for wheezing. 1 Inhaler 1   atorvastatin (LIPITOR) 40 MG tablet TAKE 1 TABLET BY MOUTH EVERY DAY 90 tablet 3   busPIRone (BUSPAR) 5 MG tablet Take 5 mg by mouth 3 (three) times daily.     Capsaicin (MUSCLE RELIEF EX) Apply 1 application topically daily as needed (muscle cramps).     clopidogrel (PLAVIX) 75 MG tablet Take 75 mg by mouth daily.     diclofenac Sodium (VOLTAREN) 1 % GEL Apply 2 g topically 4 (four) times daily. (Patient taking differently: Apply 2 g topically daily as needed (Pain).) 50 g 1   ergocalciferol (VITAMIN D2) 1.25 MG (50000 UT) capsule Take 50,000 Units by mouth every Wednesday.      gabapentin (NEURONTIN) 300 MG capsule Take 300-600 mg by mouth See admin instructions. Take 300 mg morning and noon and 600 mg at bedtime      hydrochlorothiazide (HYDRODIURIL) 25 MG tablet Take 25 mg by mouth every morning.     HYDROcodone-acetaminophen (NORCO) 10-325 MG tablet Take 1 tablet by mouth in the morning, at noon, and at bedtime.     HYDROcodone-acetaminophen (NORCO) 10-325 MG tablet Take 1 tablet by mouth every 8 (eight) hours as needed for pain 90 tablet 0   ketoconazole (NIZORAL) 2 % cream Apply 1 application topically daily as needed for irritation.     levETIRAcetam (KEPPRA) 500 MG tablet Take 1 tablet (500 mg total) by mouth 2 (two) times daily. 180 tablet 3   loratadine (CLARITIN) 10 MG tablet Take 10 mg by mouth  daily as needed for allergies or rhinitis.     pantoprazole (PROTONIX) 40 MG tablet Take 40 mg by mouth at bedtime.     QUEtiapine (SEROQUEL) 300 MG tablet Take 1 tablet (300 mg total) by mouth at bedtime. 90 tablet 3   tetrahydrozoline-zinc (VISINE-AC) 0.05-0.25 % ophthalmic solution Place 1-2 drops into both eyes 3 (three) times daily as needed (for irritation).      No current facility-administered medications for this visit.    REVIEW OF SYSTEMS:  [X]  denotes positive finding, [ ]  denotes negative finding Cardiac  Comments:  Chest pain or chest pressure:    Shortness of breath upon exertion:    Short of breath when lying flat:    Irregular heart rhythm:        Vascular    Pain in calf, thigh, or hip brought on by ambulation:    Pain in feet at night that wakes you up from your sleep:     Blood clot in your veins:    Leg swelling:         Pulmonary    Oxygen at home:    Productive cough:     Wheezing:         Neurologic    Sudden weakness in arms or legs:     Sudden numbness in arms or legs:     Sudden onset of difficulty speaking or slurred speech:    Temporary loss of vision in one eye:     Problems with dizziness:         Gastrointestinal    Blood in stool:     Vomited blood:         Genitourinary    Burning when urinating:     Blood in urine:        Psychiatric    Major  depression:         Hematologic    Bleeding problems:    Problems with blood clotting too easily:        Skin    Rashes or ulcers:        Constitutional    Fever or chills:      PHYSICAL EXAM: Vitals:   06/08/22 1511  BP: 130/86  Pulse: 83  Resp: 20  Temp: 97.8 F (36.6 C)  TempSrc: Temporal  SpO2: 95%  Weight: 183 lb 6.4 oz (83.2 kg)  Height: 5\' 5"  (1.651 m)    GENERAL: The patient is a well-nourished female, in no acute distress. The vital signs are documented above. CARDIAC: There is a regular rate and rhythm.  VASCULAR:  Right femoral pulse palpable Hard to appreciate left femoral pulse Difficult to appreciate right pedal pulses No lower extremity tissue loss PULMONARY: No respiratory distress. ABDOMEN: Soft and non-tender. MUSCULOSKELETAL: There are no major deformities or cyanosis. NEUROLOGIC: No focal weakness or paresthesias are detected. PSYCHIATRIC: The patient has a normal affect.  DATA:   ABIs today are 0.81 on the right and 0.48 on the left   Right leg arterial duplex shows proximal anastomosis stenosis >70% with velocity 419  Assessment/Plan:  77 year old female presents for 6 month follow-up right leg bypass.  Duplex suggest a high-grade stenosis in the proximal bypass at the anastomosis with a velocity of over 400.  I cannot appreciate a pedal pulse on exam.  Her ABI did drop from 0.88 to 0.81 and I have previously been able to palpable a pedal pulse.  I think it would be reasonable to proceed with a repeat  angiogram as it has been over 1 year since we have looked at this with a focus on the right leg bypass.  Risk benefits discussed.  She wants to get this scheduled in 1 month which I think is fine.  Discussed this being done as an outpatient in the Cath Lab at [facility].   Cephus Shelling, MD [facility] [place] Office: [phone]

## 2022-06-10 ENCOUNTER — Other Ambulatory Visit: Payer: Self-pay

## 2022-06-10 DIAGNOSIS — Z95828 Presence of other vascular implants and grafts: Secondary | ICD-10-CM

## 2022-06-10 DIAGNOSIS — I70222 Atherosclerosis of native arteries of extremities with rest pain, left leg: Secondary | ICD-10-CM

## 2022-06-16 ENCOUNTER — Ambulatory Visit: Payer: Medicare HMO | Admitting: Neurology

## 2022-06-16 ENCOUNTER — Encounter: Payer: Self-pay | Admitting: Neurology

## 2022-06-16 ENCOUNTER — Other Ambulatory Visit (INDEPENDENT_AMBULATORY_CARE_PROVIDER_SITE_OTHER): Payer: Medicare HMO

## 2022-06-16 VITALS — BP 133/65 | HR 94 | Ht 65.0 in | Wt 183.2 lb

## 2022-06-16 DIAGNOSIS — G40009 Localization-related (focal) (partial) idiopathic epilepsy and epileptic syndromes with seizures of localized onset, not intractable, without status epilepticus: Secondary | ICD-10-CM

## 2022-06-16 DIAGNOSIS — R413 Other amnesia: Secondary | ICD-10-CM

## 2022-06-16 MED ORDER — LEVETIRACETAM 500 MG PO TABS: 500.0000 mg | ORAL_TABLET | Freq: Two times a day (BID) | ORAL | 3 refills | Status: DC

## 2022-06-16 NOTE — Progress Notes (Unsigned)
NEUROLOGY FOLLOW UP OFFICE NOTE  Heather Horton [id number] [birth year]  HISTORY OF PRESENT ILLNESS: I had the pleasure of seeing Heather Horton in follow-up in the neurology clinic on 06/16/2022.  The patient was last seen a year ago for seizures and memory loss. She is again accompanied by her daughter [name] who helps supplement the history today.  Records and images were personally reviewed where available.  Since her last visit, there has been some decline. She is more indecisive when going out to dinner, with difficulty making decisions on what she wants to eat. She would go into the kitchen but has to be told where things are, she can't just go and get it. They have to make sure she eats something, she gets overwhelmed when there is too much food in the fridge. It took a few minutes to find salad in the fridge. She does well with dressing and bathing but needs assistance. [name] picks out her clothes. [name] manages finances, medications, meals. She does not sleep well, taking naps during the day, which she denies. She reports a headache right now, [name] reports she has a headache a couple of times a week across her eyes. No nausea/vomiting, vision changes. She has occasional tremors in both hands. [name] has noticed she holds liquid in her mouth, she did have esophageal dilatation years ago. No personality changes, she has dreams and screams out, she has gotten physical acting out her dreams and has gotten close to falling out of bed. She fell a few months ago, no injuries. She gets dizzy occasionally. She reports keeping hydrated Her knees and back bother her, she has a walker. No seizures since 2019 on Levetiracetam 500mg  BID, no side effects.   History on Initial Assessment 06/28/2017: This is a 77 year old right-handed woman with a history of hypertension, hyperlipidemia, hepatitis C, cervical cancer, presenting for evaluation of possible seizure. She was admitted to [facility] last 03/20/2017 for altered  mental status. She lives alone and recalls getting ready for bed feeling fine, then waking up in the hospital. Her daughter visits her daily except on weekends, she had seen her Friday and spoke to her on the phone the day prior. Her daughter came Sunday noon and found her naked on the floor. She had fallen into a table in the den and daughter found her moaning with eyes rolling back, her right leg and arm were "flinching." They found her bed clothes folded on top of the bed. No tongue bite or incontinence. Her daughter feels she was down for a long time because her lips were so dry. She was brought to [facility] where CBC showed WBC 10.9, MCV 101.5, CMP showed mildly elevated creatinine 1.04, CK level was elevated at 1528. UDS was positive for opioids. She had an MRI brain with and without contrast which I personally reviewed, no acute changes. EEG showed moderate diffuse background slowing, no epileptiform discharges. It was noted that symptoms were most likely resolving toxic metabolic encephalopathy in the setting of opiate overuse, however since unclear if seizure-related, she was continued on Keppra 500mg  BID.   Since hospital discharge, her daughter reports she is back to baseline. Her daughter now administers medications. Her daughter reports that prior to hospitalization, she was taking Tramadol, Tizanidine, and Trazodone. There is note of hydrocodone, however her daughter did not find any hydrocodone bottle in the house. She states she has not taken it that day. She is now on Tramadol TID which does not help with pain  from fibromyalgia and osteoarthritis. She is scheduled to see Pain Management. Her daughter denies any staring/unresponsive episodes, she denies any other gaps in time, olfactory/gustatory hallucinations, myoclonic jerks. She has tingling in her left arm and both feet L>R with associated pain. She has swelling in both knees. She does not drive. She has a history of spinal meningitis at age 77.  Otherwise she had a normal birth and early development.  There is no history of febrile convulsions, significant traumatic brain injury, neurosurgical procedures, or family history of seizures.  Diagnostic Data:  MRI brain in 03/2017 was unremarkable MRI brain in 12/2019 was unremarkable  EEG in 03/2017 showed moderate diffuse slowing 24-hour EEG in June 2019 which was abnormal, showing occasional left mid-temporal epileptiform discharges during sleep, typical events were not captured.    PAST MEDICAL HISTORY: Past Medical History:  Diagnosis Date   Acute encephalopathy    Allergy    Anemia    Anxiety    Arthritis    Asthma    Blood transfusion without reported diagnosis    Bronchitis    Bursitis    Cervical cancer    Chronic constipation    Chronic hepatitis C without hepatic coma 10/17/2014   11/10/2015 visit with Dr. Luciana Axe. Repeat labs revealed resolution of infection following treatment.  [employer] [facility], Alisia Ferrari, [place], FNP-C; last seen 01/2014 Genotype 1b Diagnosed 01/2013   Chronic kidney disease    stage 3   Colon polyps    Family history of adverse reaction to anesthesia    daughter post-op nausea and vomiting   Fibrocystic breast disease    Fibromyalgia    diagnosed years ago per pt   Galactorrhea on right side    GERD (gastroesophageal reflux disease)    Headache    migraines in the past   Hepatitis C    Hyperlipidemia    Hypertension    Insomnia    Neuromuscular disorder    Osteoporosis    Respiratory failure 03/20/2017   Seizures     MEDICATIONS: Current Outpatient Medications on File Prior to Visit  Medication Sig Dispense Refill   albuterol (PROVENTIL HFA;VENTOLIN HFA) 108 (90 Base) MCG/ACT inhaler Inhale 2 puffs into the lungs every 6 (six) hours as needed for wheezing. 1 Inhaler 1   atorvastatin (LIPITOR) 40 MG tablet TAKE 1 TABLET BY MOUTH EVERY DAY 90 tablet 3   busPIRone (BUSPAR) 5 MG tablet Take 5 mg by mouth 3  (three) times daily.     Capsaicin (MUSCLE RELIEF EX) Apply 1 application topically daily as needed (muscle cramps).     clopidogrel (PLAVIX) 75 MG tablet Take 75 mg by mouth daily.     diclofenac Sodium (VOLTAREN) 1 % GEL Apply 2 g topically 4 (four) times daily. (Patient taking differently: Apply 2 g topically daily as needed (Pain).) 50 g 1   ergocalciferol (VITAMIN D2) 1.25 MG (50000 UT) capsule Take 50,000 Units by mouth every Wednesday.      gabapentin (NEURONTIN) 300 MG capsule Take 300-600 mg by mouth See admin instructions. Take 300 mg morning and noon and 600 mg at bedtime     hydrochlorothiazide (HYDRODIURIL) 25 MG tablet Take 25 mg by mouth every morning.     HYDROcodone-acetaminophen (NORCO) 10-325 MG tablet Take 1 tablet by mouth in the morning, at noon, and at bedtime.     HYDROcodone-acetaminophen (NORCO) 10-325 MG tablet Take 1 tablet by mouth every 8 (eight) hours as needed for pain 90 tablet  0   ketoconazole (NIZORAL) 2 % cream Apply 1 application topically daily as needed for irritation.     levETIRAcetam (KEPPRA) 500 MG tablet Take 1 tablet (500 mg total) by mouth 2 (two) times daily. 180 tablet 3   loratadine (CLARITIN) 10 MG tablet Take 10 mg by mouth daily as needed for allergies or rhinitis.     pantoprazole (PROTONIX) 40 MG tablet Take 40 mg by mouth at bedtime.     QUEtiapine (SEROQUEL) 300 MG tablet Take 1 tablet (300 mg total) by mouth at bedtime. 90 tablet 3   tetrahydrozoline-zinc (VISINE-AC) 0.05-0.25 % ophthalmic solution Place 1-2 drops into both eyes 3 (three) times daily as needed (for irritation).      No current facility-administered medications on file prior to visit.    ALLERGIES: Allergies  Allergen Reactions   Latex Itching   Molds & Smuts Other (See Comments)    Per allergy test   Pollen Extract Other (See Comments)    Per allergy test   Aspirin Nausea And Vomiting and Rash    FAMILY HISTORY: Family History  Problem Relation Age of Onset    Asthma Mother    Colon cancer Sister 67   Stroke Brother 14   Stroke Sister 31   Cancer Sister 55       lung cancer, +TOBACCO   Arthritis Daughter    Pulmonary embolism Daughter 81       on chronic warfarin   Hypertension Son 63   Esophageal cancer Neg Hx    Rectal cancer Neg Hx    Stomach cancer Neg Hx     SOCIAL HISTORY: Social History   Socioeconomic History   Marital status: Divorced    Spouse name: Not on file   Number of children: 3   Years of education: 14+   Highest education level: Not on file  Occupational History   Occupation: retired    Comment: phlebotomy, EKG tech; taught both  Tobacco Use   Smoking status: Every Day    Packs/day: 0.50    Years: 40.00    Additional pack years: 0.00    Total pack years: 20.00    Types: Cigarettes    Passive exposure: Never   Smokeless tobacco: Never   Tobacco comments:    I'm scared of e-cigarettes, interested in patches, tobacco info given 01/10/15  Vaping Use   Vaping Use: Never used  Substance and Sexual Activity   Alcohol use: Yes    Alcohol/week: 2.0 standard drinks of alcohol    Types: 2 Glasses of wine per week   Drug use: No   Sexual activity: Not Currently  Other Topics Concern   Not on file  Social History Narrative   Raised in [place], [place].   Moved to [place], [place] and then came to [place] in 2013.   Lives alone.   Daughter lives around the corner.    Raised her niece as her own daughter, and she lives nearby.   Son lives in [place].   Right handed   Social Determinants of Health   Financial Resource Strain: Not on file  Food Insecurity: Not on file  Transportation Needs: Not on file  Physical Activity: Not on file  Stress: Not on file  Social Connections: Not on file  Intimate Partner Violence: Not on file     PHYSICAL EXAM: Vitals:   06/16/22 1514  BP: 133/65  Pulse: 94  SpO2: 94%   General: No acute distress, hypomimia Head:  Normocephalic/atraumatic  Skin/Extremities: No  rash, no edema Neurological Exam: alert and oriented to person, place, and time. No aphasia or dysarthria. Fund of knowledge is appropriate.  Recent and remote memory are impaired.  Attention and concentration are reduced.  MMSE 22/30    06/16/2022    3:00 PM  MMSE - Mini Mental State Exam  Orientation to time 4  Orientation to Place 5  Registration 3  Attention/ Calculation 1  Recall 2  Language- name 2 objects 2  Language- repeat 1  Language- follow 3 step command 2  Language- read & follow direction 1  Write a sentence 1  Copy design 0  Total score 22   Cranial nerves: Pupils equal, round. Extraocular movements intact with no nystagmus. Visual fields full.  No facial asymmetry.  Motor: Bulk and tone normal, no cogwheeling, muscle strength 5/5 throughout with no pronator drift.   Finger to nose testing intact.  Gait slow and cautious with knees flexed, ambulates with walker. Good finger taps. No tremor today.  IMPRESSION: This is a 77 yo RH woman with a history of f hypertension, hyperlipidemia, hepatitis C, cervical cancer, with left temporal lobe epilepsy diagnosed after an episode of altered mental status in 2019. MRI brain at that time normal. Her 24-hour EEG was abnormal with occasional left mid-temporal epileptiform discharges. She has been seizure-free since 2019 on Levetiracetam 500mg  BID, refills sent. Continue close supervision. She does not drive. Follow-up in 1 year, call for any changes.    Thank you for allowing me to participate in *** care.  Please do not hesitate to call for any questions or concerns.  The duration of this appointment visit was *** minutes of face-to-face time with the patient.  Greater than 50% of this time was spent in counseling, explanation of diagnosis, planning of further management, and coordination of care.   Patrcia Dolly, M.D.   CC: ***

## 2022-06-16 NOTE — Patient Instructions (Signed)
Good to see you.  Have bloodwork done for TSH, B12  2. Continue Keppra  (Levetiracetam)  twice a day  3. Please call our office to confirm the Donepezil dose she is taking  4. Follow-up in 1 year, call for any changes   Seizure Precautions: 1. If medication has been prescribed for you to prevent seizures, take it exactly as directed.  Do not stop taking the medicine without talking to your doctor first, even if you have not had a seizure in a long time.   2. Avoid activities in which a seizure would cause danger to yourself or to others.  Don't operate dangerous machinery, swim alone, or climb in high or dangerous places, such as on ladders, roofs, or girders.  Do not drive unless your doctor says you may.  3. If you have any warning that you may have a seizure, lay down in a safe place where you can't hurt yourself.    4.  No driving for 6 months from last seizure, as per [facility].   Please refer to the following link on the [employer]ile[employer] of America's website for more information: http://www.epilepsyfoundation.org/answerplace/Social/driving/drivingu.cfm   5.  Maintain good sleep hygiene.   6.  Contact your doctor if you have any problems that may be related to the medicine you are taking.  7.  Call 911 and bring the patient back to the ED if:        A.  The seizure lasts longer than 5 minutes.       B.  The patient doesn't awaken shortly after the seizure  C.  The patient has new problems such as difficulty seeing, speaking or moving  D.  The patient was injured during the seizure  E.  The patient has a temperature over 102 F (39C)  F.  The patient vomited and now is having trouble breathing

## 2022-06-17 LAB — TSH: TSH: 2.15 u[IU]/mL (ref 0.35–5.50)

## 2022-06-17 LAB — VITAMIN B12: Vitamin B-12: 183 pg/mL — ABNORMAL LOW (ref 211–911)

## 2022-06-18 ENCOUNTER — Other Ambulatory Visit: Payer: Self-pay | Admitting: Neurology

## 2022-06-18 ENCOUNTER — Telehealth: Payer: Self-pay

## 2022-06-18 MED ORDER — VITAMIN DEFICIENCY SYSTEM-B12 1000 MCG/ML IJ KIT: PACK | INTRAMUSCULAR | 0 refills | Status: DC

## 2022-06-18 MED ORDER — SHARPS CONTAINER MISC: 1.0000 | 0 refills | Status: AC | PRN

## 2022-06-18 MED ORDER — "BD LUER-LOK SYRINGE 25G X 1-1/2"" 3 ML MISC": 0 refills | Status: AC

## 2022-06-18 NOTE — Telephone Encounter (Signed)
Called pts daughter informed her that Heather Horton's B12 level is low. Would recommend B12 injections, patient will need 1000 mcg daily for 1 week, then weekly for 1 month, then monthly for a year. Would it be easier/closer for her to do this with his PCP or would they like [place] to do it? Other option is we teach daughter to give injections and they do it for her. Pt daughter stated she has given her injections before she is ok given her shots. Rx sent into the pharmacy

## 2022-06-18 NOTE — Telephone Encounter (Signed)
-----   Message from Van Clines, MD sent at 06/18/2022  8:27 AM EDT ----- Pls let daughter know B12 level is low. Would recommend B12 injections, patient will need 1000 mcg daily for 1 week, then weekly for 1 month, then monthly for a year. Would it be easier/closer for her to do this with his PCP or would they like [place] to do it? Other option is we teach daughter to give injections and they do it for her. Thanks

## 2022-06-21 ENCOUNTER — Telehealth: Payer: Self-pay | Admitting: Neurology

## 2022-06-21 MED ORDER — CYANOCOBALAMIN 1000 MCG/ML IJ SOLN: INTRAMUSCULAR | 0 refills | Status: DC

## 2022-06-21 NOTE — Telephone Encounter (Signed)
New message    Pt c/o medication issue:  1. Name of Medication: cyanocobalamin (VITAMIN B12) 1000 MCG/ML injection   2. How are you currently taking this medication (dosage and times per day)? New prescription    3. Are you having a reaction (difficulty breathing--STAT)? No  4. What is your medication issue? prescription needs to be rewritten   per pharmacy - the vial is for one dosage only.  5. [id number] - [place], [place] - [address]

## 2022-06-21 NOTE — Telephone Encounter (Signed)
New rx sent

## 2022-06-27 ENCOUNTER — Other Ambulatory Visit: Payer: Self-pay | Admitting: Vascular Surgery

## 2022-07-01 ENCOUNTER — Telehealth: Payer: Self-pay

## 2022-07-01 NOTE — Telephone Encounter (Signed)
-----   Message from Van Clines, MD sent at 07/01/2022  4:22 PM EDT ----- Regarding: pls check Dtr was going to call and confirm her Donepezil dose, can you pls check, thanks

## 2022-07-01 NOTE — Telephone Encounter (Signed)
Pt daughter called no answer left a voice mail to call the office back when she calls back need to confirm her Donepezil dose

## 2022-07-02 MED ORDER — DONEPEZIL HCL 5 MG PO TABS: ORAL_TABLET | ORAL | 3 refills | Status: DC

## 2022-07-02 NOTE — Addendum Note (Signed)
Addended by: Van Clines on: 07/02/2022 03:03 PM   Modules accepted: Orders

## 2022-07-02 NOTE — Telephone Encounter (Signed)
Pt's daughter called in returning Heather's call. She stated she believes her donepezil dosage is 5mg  a day.

## 2022-07-29 ENCOUNTER — Ambulatory Visit (HOSPITAL_COMMUNITY)
Admission: RE | Admit: 2022-07-29 | Discharge: 2022-07-29 | Disposition: A | Discharge status: Home / Self Care | Payer: Medicare HMO | Attending: Vascular Surgery | Admitting: Vascular Surgery

## 2022-07-29 ENCOUNTER — Encounter (HOSPITAL_COMMUNITY)
Admission: RE | Disposition: A | Discharge status: Home / Self Care | Payer: Self-pay | Source: Home / Self Care | Attending: Vascular Surgery

## 2022-07-29 DIAGNOSIS — R936 Abnormal findings on diagnostic imaging of limbs: Secondary | ICD-10-CM | POA: Diagnosis not present

## 2022-07-29 DIAGNOSIS — I70222 Atherosclerosis of native arteries of extremities with rest pain, left leg: Secondary | ICD-10-CM

## 2022-07-29 DIAGNOSIS — F17210 Nicotine dependence, cigarettes, uncomplicated: Secondary | ICD-10-CM | POA: Insufficient documentation

## 2022-07-29 DIAGNOSIS — I70211 Atherosclerosis of native arteries of extremities with intermittent claudication, right leg: Secondary | ICD-10-CM | POA: Insufficient documentation

## 2022-07-29 DIAGNOSIS — Z95828 Presence of other vascular implants and grafts: Secondary | ICD-10-CM

## 2022-07-29 DIAGNOSIS — Z7902 Long term (current) use of antithrombotics/antiplatelets: Secondary | ICD-10-CM | POA: Diagnosis not present

## 2022-07-29 HISTORY — PX: ABDOMINAL AORTOGRAM W/LOWER EXTREMITY: CATH118223

## 2022-07-29 LAB — POCT I-STAT, CHEM 8
BUN: 7 mg/dL — ABNORMAL LOW (ref 8–23)
Calcium, Ion: 1.2 mmol/L (ref 1.15–1.40)
Chloride: 97 mmol/L — ABNORMAL LOW (ref 98–111)
Creatinine, Ser: 0.7 mg/dL (ref 0.44–1.00)
Glucose, Bld: 114 mg/dL — ABNORMAL HIGH (ref 70–99)
HCT: 42 % (ref 36.0–46.0)
Hemoglobin: 14.3 g/dL (ref 12.0–15.0)
Potassium: 3.3 mmol/L — ABNORMAL LOW (ref 3.5–5.1)
Sodium: 137 mmol/L (ref 135–145)
TCO2: 26 mmol/L (ref 22–32)

## 2022-07-29 SURGERY — ABDOMINAL AORTOGRAM W/LOWER EXTREMITY
Anesthesia: LOCAL

## 2022-07-29 MED ORDER — MIDAZOLAM HCL 2 MG/2ML IJ SOLN
INTRAMUSCULAR | Status: DC | PRN
  Administered 2022-07-29: 1 mg via INTRAVENOUS

## 2022-07-29 MED ORDER — HYDRALAZINE HCL 20 MG/ML IJ SOLN
INTRAMUSCULAR | Status: DC | PRN
  Administered 2022-07-29: 10 mg via INTRAVENOUS

## 2022-07-29 MED ORDER — ONDANSETRON HCL 4 MG/2ML IJ SOLN: 4.0000 mg | Freq: Four times a day (QID) | INTRAMUSCULAR | Status: DC | PRN

## 2022-07-29 MED ORDER — MIDAZOLAM HCL 2 MG/2ML IJ SOLN
INTRAMUSCULAR | Status: AC
  Filled 2022-07-29: qty 2

## 2022-07-29 MED ORDER — ACETAMINOPHEN 325 MG PO TABS: 650.0000 mg | ORAL_TABLET | ORAL | Status: DC | PRN

## 2022-07-29 MED ORDER — HEPARIN (PORCINE) IN NACL 1000-0.9 UT/500ML-% IV SOLN
INTRAVENOUS | Status: DC | PRN
  Administered 2022-07-29 (×2): 500 mL

## 2022-07-29 MED ORDER — FENTANYL CITRATE (PF) 100 MCG/2ML IJ SOLN
INTRAMUSCULAR | Status: DC | PRN
  Administered 2022-07-29 (×2): 25 ug via INTRAVENOUS

## 2022-07-29 MED ORDER — LIDOCAINE HCL (PF) 1 % IJ SOLN
INTRAMUSCULAR | Status: DC | PRN
  Administered 2022-07-29: 15 mL

## 2022-07-29 MED ORDER — SODIUM CHLORIDE 0.9% FLUSH: 3.0000 mL | Freq: Two times a day (BID) | INTRAVENOUS | Status: DC

## 2022-07-29 MED ORDER — IODIXANOL 320 MG/ML IV SOLN
INTRAVENOUS | Status: DC | PRN
  Administered 2022-07-29: 105 mL

## 2022-07-29 MED ORDER — LABETALOL HCL 5 MG/ML IV SOLN: 10.0000 mg | INTRAVENOUS | Status: DC | PRN

## 2022-07-29 MED ORDER — SODIUM CHLORIDE 0.9 % IV SOLN: 250.0000 mL | INTRAVENOUS | Status: DC | PRN

## 2022-07-29 MED ORDER — SODIUM CHLORIDE 0.9 % IV SOLN: INTRAVENOUS | Status: AC

## 2022-07-29 MED ORDER — LIDOCAINE HCL (PF) 1 % IJ SOLN
INTRAMUSCULAR | Status: AC
  Filled 2022-07-29: qty 30

## 2022-07-29 MED ORDER — HYDRALAZINE HCL 20 MG/ML IJ SOLN: 5.0000 mg | INTRAMUSCULAR | Status: DC | PRN

## 2022-07-29 MED ORDER — SODIUM CHLORIDE 0.9 % IV SOLN: INTRAVENOUS | Status: DC

## 2022-07-29 MED ORDER — HYDRALAZINE HCL 20 MG/ML IJ SOLN
INTRAMUSCULAR | Status: AC
  Filled 2022-07-29: qty 1

## 2022-07-29 MED ORDER — SODIUM CHLORIDE 0.9% FLUSH: 3.0000 mL | INTRAVENOUS | Status: DC | PRN

## 2022-07-29 MED ORDER — FENTANYL CITRATE (PF) 100 MCG/2ML IJ SOLN
INTRAMUSCULAR | Status: AC
  Filled 2022-07-29: qty 2

## 2022-07-29 SURGICAL SUPPLY — 14 items
BAG SNAP BAND KOVER 36X36 (MISCELLANEOUS) IMPLANT
CATH CROSS OVER TEMPO 5F (CATHETERS) IMPLANT
CATH OMNI FLUSH 5F 65CM (CATHETERS) IMPLANT
COVER DOME SNAP 22 D (MISCELLANEOUS) IMPLANT
KIT MICROPUNCTURE NIT STIFF (SHEATH) IMPLANT
KIT PV (KITS) ×1 IMPLANT
SHEATH PINNACLE 5F 10CM (SHEATH) IMPLANT
SHEATH PROBE COVER 6X72 (BAG) IMPLANT
STOPCOCK MORSE 400PSI 3WAY (MISCELLANEOUS) IMPLANT
SYR MEDRAD MARK 7 150ML (SYRINGE) ×1 IMPLANT
TRANSDUCER W/STOPCOCK (MISCELLANEOUS) ×1 IMPLANT
TRAY PV CATH (CUSTOM PROCEDURE TRAY) ×1 IMPLANT
TUBING CIL FLEX 10 FLL-RA (TUBING) IMPLANT
WIRE STARTER BENTSON 035X150 (WIRE) IMPLANT

## 2022-07-29 NOTE — Progress Notes (Signed)
Purewick placed, peri care provided, tolerated well, safety maintained  

## 2022-07-29 NOTE — H&P (Signed)
Patient name: Heather Horton MRN: [id number]        DOB: 1946-02-07            Sex: female   REASON FOR VISIT: 6 month follow-up for surveillance right leg bypass   HPI: Heather Horton is a 77 y.o. female with multiple comorbidities that presents for 6 month follow-up for surveillance of her right leg bypass.  She previously underwent a right common femoral to below-knee popliteal bypass with ipsilateral nonreversed saphenous vein on 09/03/2020 in the setting of rest pain.  She was found to have stenosis at both the inflow and outflow of her bypass (proximal and distal anastmosis) and underwent angiogram on 05/07/2021.  She underwent angioplasty of a distal 80% stenosis in the bypass with a drug-coated balloon.  We did not appreciate any significant stenosis in the proximal bypass although there was some common femoral disease that was not flow-limiting.  She is taking her Plavix since angioplasty of the distal bypass.     Today she reports some cramping in her legs at times.  This is sometimes at night and sometimes when she is walking.  No wounds.  Still smoking.       Past Medical History:  Diagnosis Date   Acute encephalopathy     Allergy     Anemia     Anxiety     Arthritis     Asthma     Blood transfusion without reported diagnosis     Bronchitis     Bursitis     Cervical cancer (HCC)     Chronic constipation     Chronic hepatitis C without hepatic coma (HCC) 10/17/2014    11/10/2015 visit with Dr. Luciana Axe. Repeat labs revealed resolution of infection following treatment.  [facility], Alisia Ferrari, [place], FNP-C; last seen 01/2014 Genotype 1b Diagnosed 01/2013   Chronic kidney disease      stage 3   Colon polyps     Family history of adverse reaction to anesthesia      daughter post-op nausea and vomiting   Fibrocystic breast disease     Fibromyalgia      diagnosed years ago per pt   Galactorrhea on right side     GERD (gastroesophageal reflux disease)      Headache      migraines in the past   Hepatitis C     Hyperlipidemia     Hypertension     Insomnia     Neuromuscular disorder (HCC)     Osteoporosis     Respiratory failure (HCC) 03/20/2017   Seizures (HCC)             Past Surgical History:  Procedure Laterality Date   ABDOMINAL AORTOGRAM W/LOWER EXTREMITY Right 08/14/2020    Procedure: ABDOMINAL AORTOGRAM W/LOWER EXTREMITY;  Surgeon: Cephus Shelling, MD;  Location: [facility]ASIVE [facility];  Service: Cardiovascular;  Laterality: Right;   ABDOMINAL AORTOGRAM W/LOWER EXTREMITY N/A 05/07/2021    Procedure: ABDOMINAL AORTOGRAM W/LOWER EXTREMITY;  Surgeon: Cephus Shelling, MD;  Location: [facility];  Service: Cardiovascular;  Laterality: N/A;   ABDOMINAL HYSTERECTOMY   30's    due to cervical cancer   APPENDECTOMY       BIOPSY   01/29/2020    Procedure: BIOPSY;  Surgeon: Meridee Score Netty Starring., MD;  Location: [facility] ENDOSCOPY;  Service: Gastroenterology;;   DILATION AND CURETTAGE OF UTERUS        pt says years ago  when living in [place]   ESOPHAGOGASTRODUODENOSCOPY (EGD) WITH PROPOFOL N/A 01/29/2020    Procedure: ESOPHAGOGASTRODUODENOSCOPY (EGD) WITH PROPOFOL;  Surgeon: Meridee Score Netty Starring., MD;  Location: [facility] ENDOSCOPY;  Service: Gastroenterology;  Laterality: N/A;   FEMORAL-POPLITEAL BYPASS GRAFT Right 09/03/2020    Procedure: BYPASS GRAFT FEMORAL-POPLITEAL ARTERY USING NON-REVERSED GREATER SAPHENOUS VEIN RIGHT;  Surgeon: Cephus Shelling, MD;  Location: [facility] OR;  Service: Vascular;  Laterality: Right;   PERIPHERAL VASCULAR BALLOON ANGIOPLASTY   05/07/2021    Procedure: PERIPHERAL VASCULAR BALLOON ANGIOPLASTY;  Surgeon: Cephus Shelling, MD;  Location: [facility];  Service: Cardiovascular;;  Distal Bypass   POLYPECTOMY       SAVORY DILATION N/A 01/29/2020    Procedure: Michiel Cowboy DILATION;  Surgeon: Lemar Lofty., MD;  Location: [facility] ENDOSCOPY;  Service: Gastroenterology;  Laterality: N/A;            Family History  Problem Relation Age of Onset   Asthma Mother     Colon cancer Sister 78   Stroke Brother 63   Stroke Sister 31   Cancer Sister 4        lung cancer, +TOBACCO   Arthritis Daughter     Pulmonary embolism Daughter 7        on chronic warfarin   Hypertension Son 32   Esophageal cancer Neg Hx     Rectal cancer Neg Hx     Stomach cancer Neg Hx        SOCIAL HISTORY: Social History         Tobacco Use   Smoking status: Every Day      Packs/day: 0.50      Years: 40.00      Additional pack years: 0.00      Total pack years: 20.00      Types: Cigarettes      Passive exposure: Never   Smokeless tobacco: Never   Tobacco comments:      I'm scared of e-cigarettes, interested in patches, tobacco info given 01/10/15  Substance Use Topics   Alcohol use: Yes      Alcohol/week: 2.0 standard drinks of alcohol      Types: 2 Glasses of wine per week           Allergies  Allergen Reactions   Latex Itching   Molds & Smuts Other (See Comments)      Per allergy test   Pollen Extract Other (See Comments)      Per allergy test   Aspirin Nausea And Vomiting and Rash            Current Outpatient Medications  Medication Sig Dispense Refill   albuterol (PROVENTIL HFA;VENTOLIN HFA) 108 (90 Base) MCG/ACT inhaler Inhale 2 puffs into the lungs every 6 (six) hours as needed for wheezing. 1 Inhaler 1   atorvastatin (LIPITOR) 40 MG tablet TAKE 1 TABLET BY MOUTH EVERY DAY 90 tablet 3   busPIRone (BUSPAR) 5 MG tablet Take 5 mg by mouth 3 (three) times daily.       Capsaicin (MUSCLE RELIEF EX) Apply 1 application topically daily as needed (muscle cramps).       clopidogrel (PLAVIX) 75 MG tablet Take 75 mg by mouth daily.       diclofenac Sodium (VOLTAREN) 1 % GEL Apply 2 g topically 4 (four) times daily. (Patient taking differently: Apply 2 g topically daily as needed (Pain).) 50 g 1   ergocalciferol (VITAMIN D2) 1.25 MG (50000 UT) capsule Take 50,000 Units by  mouth every  Wednesday.        gabapentin (NEURONTIN) 300 MG capsule Take 300-600 mg by mouth See admin instructions. Take 300 mg morning and noon and 600 mg at bedtime       hydrochlorothiazide (HYDRODIURIL) 25 MG tablet Take 25 mg by mouth every morning.       HYDROcodone-acetaminophen (NORCO) 10-325 MG tablet Take 1 tablet by mouth in the morning, at noon, and at bedtime.       HYDROcodone-acetaminophen (NORCO) 10-325 MG tablet Take 1 tablet by mouth every 8 (eight) hours as needed for pain 90 tablet 0   ketoconazole (NIZORAL) 2 % cream Apply 1 application topically daily as needed for irritation.       levETIRAcetam (KEPPRA) 500 MG tablet Take 1 tablet (500 mg total) by mouth 2 (two) times daily. 180 tablet 3   loratadine (CLARITIN) 10 MG tablet Take 10 mg by mouth daily as needed for allergies or rhinitis.       pantoprazole (PROTONIX) 40 MG tablet Take 40 mg by mouth at bedtime.       QUEtiapine (SEROQUEL) 300 MG tablet Take 1 tablet (300 mg total) by mouth at bedtime. 90 tablet 3   tetrahydrozoline-zinc (VISINE-AC) 0.05-0.25 % ophthalmic solution Place 1-2 drops into both eyes 3 (three) times daily as needed (for irritation).         No current facility-administered medications for this visit.      REVIEW OF SYSTEMS:  [X]  denotes positive finding, [ ]  denotes negative finding Cardiac   Comments:  Chest pain or chest pressure:      Shortness of breath upon exertion:      Short of breath when lying flat:      Irregular heart rhythm:             Vascular      Pain in calf, thigh, or hip brought on by ambulation:      Pain in feet at night that wakes you up from your sleep:       Blood clot in your veins:      Leg swelling:              Pulmonary      Oxygen at home:      Productive cough:       Wheezing:              Neurologic      Sudden weakness in arms or legs:       Sudden numbness in arms or legs:       Sudden onset of difficulty speaking or slurred speech:      Temporary loss of  vision in one eye:       Problems with dizziness:              Gastrointestinal      Blood in stool:       Vomited blood:              Genitourinary      Burning when urinating:       Blood in urine:             Psychiatric      Major depression:              Hematologic      Bleeding problems:      Problems with blood clotting too easily:             Skin  Rashes or ulcers:             Constitutional      Fever or chills:          PHYSICAL EXAM:    Vitals:    06/08/22 1511  BP: 130/86  Pulse: 83  Resp: 20  Temp: 97.8 F (36.6 C)  TempSrc: Temporal  SpO2: 95%  Weight: 183 lb 6.4 oz (83.2 kg)  Height: 5\' 5"  (1.651 m)      GENERAL: The patient is a well-nourished female, in no acute distress. The vital signs are documented above. CARDIAC: There is a regular rate and rhythm.  VASCULAR:  Right femoral pulse palpable Hard to appreciate left femoral pulse Difficult to appreciate right pedal pulses No lower extremity tissue loss PULMONARY: No respiratory distress. ABDOMEN: Soft and non-tender. MUSCULOSKELETAL: There are no major deformities or cyanosis. NEUROLOGIC: No focal weakness or paresthesias are detected. PSYCHIATRIC: The patient has a normal affect.   DATA:    ABIs today are 0.81 on the right and 0.48 on the left    Right leg arterial duplex shows proximal anastomosis stenosis >70% with velocity 419   Assessment/Plan:   77 year old female presents for 6 month follow-up right leg bypass.  Duplex suggest a high-grade stenosis in the proximal bypass at the anastomosis with a velocity of over 400.  I cannot appreciate a pedal pulse on exam.  Her ABI did drop from 0.88 to 0.81 and I have previously been able to palpable a pedal pulse.  I think it would be reasonable to proceed with a repeat angiogram as it has been over 1 year since we have looked at this with a focus on the right leg bypass.  Risk benefits discussed.     Cephus Shelling,  MD [facility] Office: [phone]

## 2022-07-29 NOTE — Op Note (Signed)
Patient name: Heather Horton MRN: [id number] DOB: 01-Oct-1945 Sex: female  07/29/2022 Pre-operative Diagnosis: Concern for high-grade stenosis in proximal right common femoral to below-knee popliteal vein bypass Post-operative diagnosis:  Same Surgeon:  Cephus Shelling, MD Procedure Performed: 1.  Ultrasound-guided access left common femoral artery 2.  Aortogram with catheter selection of aorta 3.  Bilateral lower extremity arteriogram with runoff 4.  Catheter selection of right common femoral to below-knee popliteal vein bypass with pullback pressure at the proximal anastomosis and hand-injection angiography 5.  32 minutes of monitored moderate conscious sedation time  Indications: Patient is a 77 year old female that previously underwent a right common femoral to below-knee popliteal bypass with ipsilateral nonreversed great saphenous vein on 09/03/2020 for rest pain.  She has previously been treated for distal bypass stenosis.  Recent surveillance duplex showed evidence of high-grade stenosis in the proximal anastomosis of the right leg bypass.  She presents for arteriogram images and possible intervention after risks benefits discussed.  Findings:   Aortogram showed patent left renal artery (the right was not seen).  She has a patent infrarenal aorta that is small.  Both iliac arteries are patent and small.  The right common femoral to below-knee popliteal vein bypass is widely patent with brisk flow.  Two-vessel runoff in the peroneal and anterior tibial artery on the right.  I did get multiple dedicated images of the proximal anastomosis with no evidence of a high-grade stenosis.  I did a pullback pressure here from the proximal vein bypass into the common femoral artery and there was only a 5 mm Hg pressure difference that is not considered significant.  We elected no intervention.  Left lower extremity runoff showed a calcified high-grade stenosis in the common femoral artery.  She has  diseased but patent profunda.  The SFA is patent proximal but has a mid SFA chronic total occlusion.  She reconstitutes the above-knee popliteal artery at Hunter's canal and has two-vessel runoff in the anterior tibial and peroneal there were poorly visualized.   Procedure:  The patient was identified in the holding area and taken to room 8.  The patient was then placed supine on the table and prepped and draped in the usual sterile fashion.  A time out was called.  The patient received Versed and fentanyl for moderate conscious sedation.  Vital signs were monitored including heart rate, respiratory rate, oxygenation and blood pressure.  I was present for all of moderate sedation.  Ultrasound was used to evaluate the left common femoral artery.  It was patent .  A digital ultrasound image was acquired.  A micropuncture needle was used to access the left common femoral artery under ultrasound guidance.  An 018 wire was advanced without resistance and a micropuncture sheath was placed.  The 018 wire was removed and a benson wire was placed.  The micropuncture sheath was exchanged for a 5 french sheath.  An omniflush catheter was advanced over the wire to the level of L-1.  An abdominal angiogram was obtained.  Next the catheter was pulled down in the abdominal aorta and bilateral lower extremity runoff was obtained.  Pertinent findings are noted above.  Given the focus was on the right leg bypass, I crossed the aortic bifurcation with a crossover catheter and then exchanged back for the Omni Flush catheter.  We got dedicated imaging of the proximal right leg bypass including the proximal anastomosis.  I did cannulate the bypass and we did a pullback pressure that only  showed a 5 mm Hg pressure difference.  We do not consider this significant.  No intervention was performed.  She will be taken to holding to have the sheath removed from her left groin given common femoral disease.  Plan: No evidence of high-grade  stenosis in the proximal right leg bypass.  No intervention performed.  There is a large caliber change between a small common femoral artery and a large vein graft that would explain elevated velocities.  I will follow-up with her in 1 month with left leg arterial duplex to evaluate for possible left leg intervention in the future.  She states she is having some cramping in the left leg and has apparent common femoral disease with SFA occlusion.  She walks with a walker so I want her to monitor her symptoms.  No critical limb ischemia at this time.   Cephus Shelling, MD [facility] Office: [phone]

## 2022-07-29 NOTE — Progress Notes (Signed)
SITE AREA: left femoral  SITE PRIOR TO REMOVAL:  LEVEL 0  PRESSURE APPLIED FOR: aaproximately 25 minutes  MANUAL: yes, removed by Will, 2H RN  PATIENT STATUS DURING PULL: stable  POST PULL SITE:  LEVEL 0  POST PULL INSTRUCTIONS GIVEN: yes, drsg to remain in place for 24 hours, no soaking in hot tubs, bath tubs, or pools x 1 week, may shower tomorrow, no running, jumping, take It easy climbing up/down stair or getting into vehichle  POST PULL PULSES PRESENT: bilateral pedal pulses at +1, palpable  DRESSING APPLIED: gauze with tegaderm  BEDREST BEGINS @ 0926  COMMENTS: excoriated areas (right and left sides) noted to lower panis, no drainage present, but redness/pinkness present, pt states her doctor gave her cream to apply to areas

## 2022-07-29 NOTE — Discharge Instructions (Signed)
Femoral Site Care This sheet gives you information about how to care for yourself after your procedure. Your health care provider may also give you more specific instructions. If you have problems or questions, contact your health care provider. What can I expect after the procedure?  After the procedure, it is common to have: Bruising that usually fades within 1-2 weeks. Tenderness at the site. Follow these instructions at home: Wound care Follow instructions from your health care provider about how to take care of your insertion site. Make sure you: Wash your hands with soap and water before you change your bandage (dressing). If soap and water are not available, use hand sanitizer. Remove your dressing as told by your health care provider. In 24 hours Do not take baths, swim, or use a hot tub until your health care provider approves. You may shower 24-48 hours after the procedure or as told by your health care provider. Gently wash the site with plain soap and water. Pat the area dry with a clean towel. Do not rub the site. This may cause bleeding. Do not apply powder or lotion to the site. Keep the site clean and dry. Check your femoral site every day for signs of infection. Check for: Redness, swelling, or pain. Fluid or blood. Warmth. Pus or a bad smell. Activity For the first 2-3 days after your procedure, or as long as directed: Avoid climbing stairs as much as possible. Do not squat. Do not lift anything that is heavier than 10 lb (4.5 kg), or the limit that you are told, until your health care provider says that it is safe. For 5 days Rest as directed. Avoid sitting for a long time without moving. Get up to take short walks every 1-2 hours. Do not drive for 24 hours if you were given a medicine to help you relax (sedative). General instructions Take over-the-counter and prescription medicines only as told by your health care provider. Keep all follow-up visits as told by  your health care provider. This is important. Contact a health care provider if you have: A fever or chills. You have redness, swelling, or pain around your insertion site. Get help right away if: The catheter insertion area swells very fast. You pass out. You suddenly start to sweat or your skin gets clammy. The catheter insertion area is bleeding, and the bleeding does not stop when you hold steady pressure on the area. The area near or just beyond the catheter insertion site becomes pale, cool, tingly, or numb. These symptoms may represent a serious problem that is an emergency. Do not wait to see if the symptoms will go away. Get medical help right away. Call your local emergency services (911 in the [place] Do not drive yourself to the hospital. Summary After the procedure, it is common to have bruising that usually fades within 1-2 weeks. Check your femoral site every day for signs of infection. Do not lift anything that is heavier than 10 lb (4.5 kg), or the limit that you are told, until your health care provider says that it is safe. This information is not intended to replace advice given to you by your health care provider. Make sure you discuss any questions you have with your health care provider. Document Revised: 02/28/2017 Document Reviewed: 02/28/2017 [employer]vier Patient Education  2020 [employer] 

## 2022-07-30 ENCOUNTER — Encounter (HOSPITAL_COMMUNITY): Payer: Self-pay | Admitting: Vascular Surgery

## 2022-07-31 ENCOUNTER — Other Ambulatory Visit: Payer: Self-pay | Admitting: Neurology

## 2022-08-23 ENCOUNTER — Other Ambulatory Visit: Payer: Self-pay | Admitting: *Deleted

## 2022-08-23 DIAGNOSIS — I70222 Atherosclerosis of native arteries of extremities with rest pain, left leg: Secondary | ICD-10-CM

## 2022-08-23 DIAGNOSIS — Z95828 Presence of other vascular implants and grafts: Secondary | ICD-10-CM

## 2022-08-24 ENCOUNTER — Other Ambulatory Visit: Payer: Self-pay | Admitting: Neurology

## 2022-09-06 NOTE — Progress Notes (Unsigned)
Patient name: Heather Horton MRN: [id number] DOB: Nov 16, 1945 Sex: female  REASON FOR CONSULT: Follow-up after recent right leg angiogram  HPI: Heather Horton is a 77 y.o. female, with multiple comorbidities as listed that presents for follow-up after recent right leg angiogram.  She underwent angiogram on 07/29/2022 with concern for high-grade stenosis of her right common femoral to below-knee popliteal vein bypass.  No intervention was performed as no stenosis was identified.  She is a previous right common femoral to below-knee popliteal bypass with vein for Sielaff with rest pain on 09/03/2020.  She is complaining of some cramping in her left leg now.  Past Medical History:  Diagnosis Date   Acute encephalopathy    Allergy    Anemia    Anxiety    Arthritis    Asthma    Blood transfusion without reported diagnosis    Bronchitis    Bursitis    Cervical cancer (HCC)    Chronic constipation    Chronic hepatitis C without hepatic coma (HCC) 10/17/2014   11/10/2015 visit with Dr. Luciana Axe. Repeat labs revealed resolution of infection following treatment.  [facility], Alisia Ferrari, [place], FNP-C; last seen 01/2014 Genotype 1b Diagnosed 01/2013   Chronic kidney disease    stage 3   Colon polyps    Family history of adverse reaction to anesthesia    daughter post-op nausea and vomiting   Fibrocystic breast disease    Fibromyalgia    diagnosed years ago per pt   Galactorrhea on right side    GERD (gastroesophageal reflux disease)    Headache    migraines in the past   Hepatitis C    Hyperlipidemia    Hypertension    Insomnia    Neuromuscular disorder (HCC)    Osteoporosis    Respiratory failure (HCC) 03/20/2017   Seizures (HCC)     Past Surgical History:  Procedure Laterality Date   ABDOMINAL AORTOGRAM W/LOWER EXTREMITY Right 08/14/2020   Procedure: ABDOMINAL AORTOGRAM W/LOWER EXTREMITY;  Surgeon: Cephus Shelling, MD;  [facility];  Service: Cardiovascular;  Laterality: Right;   ABDOMINAL AORTOGRAM W/LOWER EXTREMITY N/A 05/07/2021   Procedure: ABDOMINAL AORTOGRAM W/LOWER EXTREMITY;  Surgeon: Cephus Shelling, MD;  [facility];  Service: Cardiovascular;  Laterality: N/A;   ABDOMINAL AORTOGRAM W/LOWER EXTREMITY N/A 07/29/2022   Procedure: ABDOMINAL AORTOGRAM W/LOWER EXTREMITY;  Surgeon: Cephus Shelling, MD;  [facility];  Service: Cardiovascular;  Laterality: N/A;   ABDOMINAL HYSTERECTOMY  30's   due to cervical cancer   APPENDECTOMY     BIOPSY  01/29/2020   Procedure: BIOPSY;  Surgeon: Meridee Score Netty Starring., MD;  Location: [facility] ENDOSCOPY;  Service: Gastroenterology;;   DILATION AND CURETTAGE OF UTERUS     pt says years ago when living in [place]   ESOPHAGOGASTRODUODENOSCOPY (EGD) WITH PROPOFOL N/A 01/29/2020   Procedure: ESOPHAGOGASTRODUODENOSCOPY (EGD) WITH PROPOFOL;  Surgeon: Lemar Lofty., MD;  Location: [facility] ENDOSCOPY;  Service: Gastroenterology;  Laterality: N/A;   FEMORAL-POPLITEAL BYPASS GRAFT Right 09/03/2020   Procedure: BYPASS GRAFT FEMORAL-POPLITEAL ARTERY USING NON-REVERSED GREATER SAPHENOUS VEIN RIGHT;  Surgeon: Cephus Shelling, MD;  [facility];  Service: Vascular;  Laterality: Right;   PERIPHERAL VASCULAR BALLOON ANGIOPLASTY  05/07/2021   Procedure: PERIPHERAL VASCULAR BALLOON ANGIOPLASTY;  Surgeon: Cephus Shelling, MD;  [facility];  Service: Cardiovascular;;  Distal Bypass   POLYPECTOMY     SAVORY DILATION N/A  01/29/2020   Procedure: Michiel Cowboy DILATION;  Surgeon: Lemar Lofty., MD;  Location: [facility] ENDOSCOPY;  Service: Gastroenterology;  Laterality: N/A;    Family History  Problem Relation Age of Onset   Asthma Mother    Colon cancer Sister 101   Stroke Brother 49   Stroke Sister 84   Cancer Sister 62       lung cancer, +TOBACCO   Arthritis Daughter    Pulmonary embolism Daughter 60       on chronic warfarin    Hypertension Son 34   Esophageal cancer Neg Hx    Rectal cancer Neg Hx    Stomach cancer Neg Hx     SOCIAL HISTORY: Social History   Socioeconomic History   Marital status: Divorced    Spouse name: Not on file   Number of children: 3   Years of education: 14+   Highest education level: Not on file  Occupational History   Occupation: retired    Comment: phlebotomy, EKG tech; taught both  Tobacco Use   Smoking status: Every Day    Packs/day: 0.50    Years: 40.00    Additional pack years: 0.00    Total pack years: 20.00    Types: Cigarettes    Passive exposure: Never   Smokeless tobacco: Never   Tobacco comments:    I'm scared of e-cigarettes, interested in patches, tobacco info given 01/10/15  Vaping Use   Vaping Use: Never used  Substance and Sexual Activity   Alcohol use: Yes    Alcohol/week: 2.0 standard drinks of alcohol    Types: 2 Glasses of wine per week   Drug use: No   Sexual activity: Not Currently  Other Topics Concern   Not on file  Social History Narrative   Raised in [place], [place].   Moved to [place], [place] and then came to [place] in 2013.   Lives alone.   Daughter lives around the corner.    Raised her niece as her own daughter, and she lives nearby.   Son lives in [place].   Right handed   Social Determinants of Health   Financial Resource Strain: Not on file  Food Insecurity: Not on file  Transportation Needs: Not on file  Physical Activity: Not on file  Stress: Not on file  Social Connections: Not on file  Intimate Partner Violence: Not on file    Allergies  Allergen Reactions   Latex Itching   Molds & Smuts Other (See Comments)    Per allergy test   Pollen Extract Other (See Comments)    Per allergy test   Aspirin Nausea And Vomiting and Rash    Current Outpatient Medications  Medication Sig Dispense Refill   albuterol (PROVENTIL HFA;VENTOLIN HFA) 108 (90 Base) MCG/ACT inhaler Inhale 2 puffs into the lungs every 6 (six) hours  as needed for wheezing. 1 Inhaler 1   atorvastatin (LIPITOR) 40 MG tablet TAKE 1 TABLET BY MOUTH EVERY DAY (Patient taking differently: Take 40 mg by mouth daily in the afternoon.) 90 tablet 3   Budesonide (PULMICORT FLEXHALER) 90 MCG/ACT inhaler Inhale 1 puff into the lungs 2 (two) times daily as needed (shortness of breath).     busPIRone (BUSPAR) 5 MG tablet Take 5 mg by mouth 3 (three) times daily.     Capsaicin (MUSCLE RELIEF EX) Apply 1 application topically daily as needed (muscle cramps).     clopidogrel (PLAVIX) 75 MG tablet Take 75 mg by mouth daily.  cyanocobalamin (VITAMIN B12) 1000 MCG/ML injection 1000 MCG (1 ML) DAILY FOR 1 WEEK, THEN WEEKLY FOR 1 MONTH, THEN MONTHLY FOR A YEAR 3 mL 3   diclofenac Sodium (VOLTAREN) 1 % GEL Apply 2 g topically 4 (four) times daily. (Patient taking differently: Apply 2 g topically daily as needed (Pain).) 50 g 1   donepezil (ARICEPT) 5 MG tablet Take 1 tablet daily 90 tablet 3   ergocalciferol (VITAMIN D2) 1.25 MG (50000 UT) capsule Take 50,000 Units by mouth every Wednesday.      gabapentin (NEURONTIN) 300 MG capsule Take 300-600 mg by mouth See admin instructions. Take 300 mg morning and noon and 600 mg at bedtime     hydrochlorothiazide (HYDRODIURIL) 25 MG tablet Take 25 mg by mouth every morning.     HYDROcodone-acetaminophen (NORCO) 10-325 MG tablet Take 1 tablet by mouth every 8 (eight) hours as needed for pain 90 tablet 0   ketoconazole (NIZORAL) 2 % cream Apply 1 application topically daily as needed for irritation.     levETIRAcetam (KEPPRA) 500 MG tablet Take 1 tablet (500 mg total) by mouth 2 (two) times daily. 180 tablet 3   loratadine (CLARITIN) 10 MG tablet Take 10 mg by mouth daily as needed for allergies or rhinitis.     pantoprazole (PROTONIX) 40 MG tablet Take 40 mg by mouth at bedtime.     QUEtiapine (SEROQUEL) 300 MG tablet Take 1 tablet (300 mg total) by mouth at bedtime. 90 tablet 3   sharps container 1 each by Does not apply  route as needed. 1 each 0   SYRINGE-NEEDLE, DISP, 3 ML (B-D 3CC LUER-LOK SYR 25GX1/2") 25G X 1-1/2" 3 ML MISC Use with B 12 injection 50 each 0   tetrahydrozoline-zinc (VISINE-AC) 0.05-0.25 % ophthalmic solution Place 1-2 drops into both eyes 3 (three) times daily as needed (for irritation).      No current facility-administered medications for this visit.    REVIEW OF SYSTEMS:  [X]  denotes positive finding, [ ]  denotes negative finding Cardiac  Comments:  Chest pain or chest pressure: ***   Shortness of breath upon exertion:    Short of breath when lying flat:    Irregular heart rhythm:        Vascular    Pain in calf, thigh, or hip brought on by ambulation:    Pain in feet at night that wakes you up from your sleep:     Blood clot in your veins:    Leg swelling:         Pulmonary    Oxygen at home:    Productive cough:     Wheezing:         Neurologic    Sudden weakness in arms or legs:     Sudden numbness in arms or legs:     Sudden onset of difficulty speaking or slurred speech:    Temporary loss of vision in one eye:     Problems with dizziness:         Gastrointestinal    Blood in stool:     Vomited blood:         Genitourinary    Burning when urinating:     Blood in urine:        Psychiatric    Major depression:         Hematologic    Bleeding problems:    Problems with blood clotting too easily:        Skin  Rashes or ulcers:        Constitutional    Fever or chills:      PHYSICAL EXAM: There were no vitals filed for this visit.  GENERAL: The patient is a well-nourished female, in no acute distress. The vital signs are documented above. CARDIAC: There is a regular rate and rhythm.  VASCULAR: *** PULMONARY: There is good air exchange bilaterally without wheezing or rales. ABDOMEN: Soft and non-tender with normal pitched bowel sounds.  MUSCULOSKELETAL: There are no major deformities or cyanosis. NEUROLOGIC: No focal weakness or paresthesias are  detected. SKIN: There are no ulcers or rashes noted. PSYCHIATRIC: The patient has a normal affect.  DATA:   ***  Assessment/Plan:  ***   Cephus Shelling, MD [facility] Office: [phone]

## 2022-09-07 ENCOUNTER — Encounter: Payer: Self-pay | Admitting: Vascular Surgery

## 2022-09-07 ENCOUNTER — Ambulatory Visit (INDEPENDENT_AMBULATORY_CARE_PROVIDER_SITE_OTHER): Payer: Medicare HMO | Admitting: Vascular Surgery

## 2022-09-07 ENCOUNTER — Encounter (HOSPITAL_COMMUNITY): Payer: Medicare HMO

## 2022-09-07 ENCOUNTER — Ambulatory Visit (HOSPITAL_COMMUNITY)
Admission: RE | Admit: 2022-09-07 | Discharge: 2022-09-07 | Disposition: A | Discharge status: Home / Self Care | Payer: Medicare HMO | Source: Ambulatory Visit | Attending: Vascular Surgery | Admitting: Vascular Surgery

## 2022-09-07 VITALS — BP 139/84 | HR 88 | Temp 97.8°F | Resp 14 | Ht 65.0 in | Wt 180.0 lb

## 2022-09-07 DIAGNOSIS — Z95828 Presence of other vascular implants and grafts: Secondary | ICD-10-CM | POA: Insufficient documentation

## 2022-09-07 DIAGNOSIS — I70222 Atherosclerosis of native arteries of extremities with rest pain, left leg: Secondary | ICD-10-CM | POA: Insufficient documentation

## 2022-09-21 ENCOUNTER — Other Ambulatory Visit: Payer: Self-pay

## 2022-09-21 DIAGNOSIS — I739 Peripheral vascular disease, unspecified: Secondary | ICD-10-CM

## 2022-11-22 ENCOUNTER — Encounter: Payer: Self-pay | Admitting: Neurology

## 2022-12-06 NOTE — Progress Notes (Unsigned)
Patient name: Heather Horton MRN: [id number] DOB: May 09, 1945 Sex: female  REASON FOR CONSULT: 60-month follow-up  HPI: Heather Horton is a 77 y.o. female, with multiple comorbidities as listed below that presents for 3 month follow-up of her PAD.  She underwent angiogram on 07/29/2022 with concern for high-grade stenosis of her right common femoral to below-knee popliteal vein bypass.  No intervention was performed as no stenosis was identified.  She had a previous right common femoral to below-knee popliteal bypass with vein for CLI with rest pain on 09/03/2020.  Noted to have high-grade left common femoral stenosis versus subtotal occlusion on recent angiogram and was complaining of some left leg cramping.  We previously recommended no intervention on the left leg as she was having very vague mild symptoms.  On follow-up today she states both her legs hurt.  She gets a pins and needle sensation in her feet.  Also complains of knee pain and swelling in the knee.  The most active thing she does is walk with a walker in her home but is doing some therapy.   Past Medical History:  Diagnosis Date   Acute encephalopathy    Allergy    Anemia    Anxiety    Arthritis    Asthma    Blood transfusion without reported diagnosis    Bronchitis    Bursitis    Cervical cancer (HCC)    Chronic constipation    Chronic hepatitis C without hepatic coma (HCC) 10/17/2014   11/10/2015 visit with Dr. Luciana Axe. Repeat labs revealed resolution of infection following treatment.  [facility], Alisia Ferrari, [place], FNP-C; last seen 01/2014 Genotype 1b Diagnosed 01/2013   Chronic kidney disease    stage 3   Colon polyps    Family history of adverse reaction to anesthesia    daughter post-op nausea and vomiting   Fibrocystic breast disease    Fibromyalgia    diagnosed years ago per pt   Galactorrhea on right side    GERD (gastroesophageal reflux disease)    Headache    migraines in the  past   Hepatitis C    Hyperlipidemia    Hypertension    Insomnia    Neuromuscular disorder (HCC)    Osteoporosis    Respiratory failure (HCC) 03/20/2017   Seizures (HCC)     Past Surgical History:  Procedure Laterality Date   ABDOMINAL AORTOGRAM W/LOWER EXTREMITY Right 08/14/2020   Procedure: ABDOMINAL AORTOGRAM W/LOWER EXTREMITY;  Surgeon: Cephus Shelling, MD;  [facility];  Service: Cardiovascular;  Laterality: Right;   ABDOMINAL AORTOGRAM W/LOWER EXTREMITY N/A 05/07/2021   Procedure: ABDOMINAL AORTOGRAM W/LOWER EXTREMITY;  Surgeon: Cephus Shelling, MD;  [facility];  Service: Cardiovascular;  Laterality: N/A;   ABDOMINAL AORTOGRAM W/LOWER EXTREMITY N/A 07/29/2022   Procedure: ABDOMINAL AORTOGRAM W/LOWER EXTREMITY;  Surgeon: Cephus Shelling, MD;  [facility];  Service: Cardiovascular;  Laterality: N/A;   ABDOMINAL HYSTERECTOMY  30's   due to cervical cancer   APPENDECTOMY     BIOPSY  01/29/2020   Procedure: BIOPSY;  Surgeon: Meridee Score Netty Starring., MD;  Location: [facility] ENDOSCOPY;  Service: Gastroenterology;;   DILATION AND CURETTAGE OF UTERUS     pt says years ago when living in [place]   ESOPHAGOGASTRODUODENOSCOPY (EGD) WITH PROPOFOL N/A 01/29/2020   Procedure: ESOPHAGOGASTRODUODENOSCOPY (EGD) WITH PROPOFOL;  Surgeon: Lemar Lofty., MD;  Location: [facility] ENDOSCOPY;  Service: Gastroenterology;  Laterality: N/A;  FEMORAL-POPLITEAL BYPASS GRAFT Right 09/03/2020   Procedure: BYPASS GRAFT FEMORAL-POPLITEAL ARTERY USING NON-REVERSED GREATER SAPHENOUS VEIN RIGHT;  Surgeon: Cephus Shelling, MD;  [facility];  Service: Vascular;  Laterality: Right;   PERIPHERAL VASCULAR BALLOON ANGIOPLASTY  05/07/2021   Procedure: PERIPHERAL VASCULAR BALLOON ANGIOPLASTY;  Surgeon: Cephus Shelling, MD;  [facility];  Service: Cardiovascular;;  Distal Bypass   POLYPECTOMY     SAVORY DILATION N/A 01/29/2020    Procedure: Michiel Cowboy DILATION;  Surgeon: Lemar Lofty., MD;  Location: [facility] ENDOSCOPY;  Service: Gastroenterology;  Laterality: N/A;    Family History  Problem Relation Age of Onset   Asthma Mother    Colon cancer Sister 72   Stroke Brother 53   Stroke Sister 3   Cancer Sister 30       lung cancer, +TOBACCO   Arthritis Daughter    Pulmonary embolism Daughter 2       on chronic warfarin   Hypertension Son 27   Esophageal cancer Neg Hx    Rectal cancer Neg Hx    Stomach cancer Neg Hx     SOCIAL HISTORY: Social History   Socioeconomic History   Marital status: Divorced    Spouse name: Not on file   Number of children: 3   Years of education: 14+   Highest education level: Not on file  Occupational History   Occupation: retired    Comment: phlebotomy, EKG tech; taught both  Tobacco Use   Smoking status: Every Day    Current packs/day: 0.50    Average packs/day: 0.5 packs/day for 40.0 years (20.0 ttl pk-yrs)    Types: Cigarettes    Passive exposure: Never   Smokeless tobacco: Never   Tobacco comments:    I'm scared of e-cigarettes, interested in patches, tobacco info given 01/10/15  Vaping Use   Vaping status: Never Used  Substance and Sexual Activity   Alcohol use: Yes    Alcohol/week: 2.0 standard drinks of alcohol    Types: 2 Glasses of wine per week   Drug use: No   Sexual activity: Not Currently  Other Topics Concern   Not on file  Social History Narrative   Raised in [place], [place].   Moved to [place], [place] and then came to [place] in 2013.   Lives alone.   Daughter lives around the corner.    Raised her niece as her own daughter, and she lives nearby.   Son lives in [place].   Right handed   Social Determinants of Health   Financial Resource Strain: Low Risk  (04/16/2022)   Received from [facility], [facility]   Overall Financial Resource Strain (CARDIA)    Difficulty of Paying Living Expenses: Not hard at all  Food Insecurity: No Food  Insecurity (04/16/2022)   [facility]   Hunger Vital Sign    Worried About Running Out of Food in the Last Year: Never true    Ran Out of Food in the Last Year: Never true  Transportation Needs: No Transportation Needs (04/16/2022)   Received from [facility], [facility]   PRAPARE - Transportation    Lack of Transportation (Medical): No    Lack of Transportation (Non-Medical): No  Physical Activity: Insufficiently Active (04/16/2022)   Received from [facility], [facility]   Exercise Vital Sign    Days of Exercise per Week: 5 days    Minutes of Exercise per Session: 20 min  Stress: No  Stress Concern Present (04/16/2022)   Received from [facility], [facility] of Occupational Health - Occupational Stress Questionnaire    Feeling of Stress : Only a little  Social Connections: Socially Integrated (04/16/2022)   Received from [facility], [facility]   Social Network    How would you rate your social network (family, work, friends)?: Good participation with social networks  Intimate Partner Violence: Not At Risk (04/16/2022)   Received from [facility], [facility]   HITS    Over the last 12 months how often did your partner physically hurt you?: 1    Over the last 12 months how often did your partner insult you or talk down to you?: 1    Over the last 12 months how often did your partner threaten you with physical harm?: 1    Over the last 12 months how often did your partner scream or curse at you?: 1    Allergies  Allergen Reactions   Latex Itching   Molds & Smuts Other (See Comments)    Per allergy test   Pollen Extract Other (See Comments)    Per allergy test   Aspirin Nausea And Vomiting and Rash    Current Outpatient Medications  Medication Sig Dispense Refill   albuterol (PROVENTIL HFA;VENTOLIN HFA) 108 (90 Base) MCG/ACT inhaler Inhale 2 puffs into the lungs every 6 (six) hours as needed for wheezing.  1 Inhaler 1   atorvastatin (LIPITOR) 40 MG tablet TAKE 1 TABLET BY MOUTH EVERY DAY (Patient taking differently: Take 40 mg by mouth daily in the afternoon.) 90 tablet 3   Budesonide (PULMICORT FLEXHALER) 90 MCG/ACT inhaler Inhale 1 puff into the lungs 2 (two) times daily as needed (shortness of breath).     busPIRone (BUSPAR) 5 MG tablet Take 5 mg by mouth 3 (three) times daily.     Capsaicin (MUSCLE RELIEF EX) Apply 1 application topically daily as needed (muscle cramps).     clopidogrel (PLAVIX) 75 MG tablet Take 75 mg by mouth daily.     cyanocobalamin (VITAMIN B12) 1000 MCG/ML injection 1000 MCG (1 ML) DAILY FOR 1 WEEK, THEN WEEKLY FOR 1 MONTH, THEN MONTHLY FOR A YEAR 3 mL 3   diclofenac Sodium (VOLTAREN) 1 % GEL Apply 2 g topically 4 (four) times daily. (Patient taking differently: Apply 2 g topically daily as needed (Pain).) 50 g 1   donepezil (ARICEPT) 5 MG tablet Take 1 tablet daily 90 tablet 3   ergocalciferol (VITAMIN D2) 1.25 MG (50000 UT) capsule Take 50,000 Units by mouth every Wednesday.      gabapentin (NEURONTIN) 300 MG capsule Take 300-600 mg by mouth See admin instructions. Take 300 mg morning and noon and 600 mg at bedtime     hydrochlorothiazide (HYDRODIURIL) 25 MG tablet Take 25 mg by mouth every morning.     HYDROcodone-acetaminophen (NORCO) 10-325 MG tablet Take 1 tablet by mouth every 8 (eight) hours as needed for pain 90 tablet 0   ketoconazole (NIZORAL) 2 % cream Apply 1 application topically daily as needed for irritation.     levETIRAcetam (KEPPRA) 500 MG tablet Take 1 tablet (500 mg total) by mouth 2 (two) times daily. 180 tablet 3   loratadine (CLARITIN) 10 MG tablet Take 10 mg by mouth daily as needed for allergies or rhinitis.     pantoprazole (PROTONIX) 40 MG tablet Take 40 mg by mouth at bedtime.     QUEtiapine (SEROQUEL) 300 MG tablet Take 1  tablet (300 mg total) by mouth at bedtime. 90 tablet 3   sharps container 1 each by Does not apply route as needed. 1 each 0    SYRINGE-NEEDLE, DISP, 3 ML (B-D 3CC LUER-LOK SYR 25GX1/2") 25G X 1-1/2" 3 ML MISC Use with B 12 injection 50 each 0   tetrahydrozoline-zinc (VISINE-AC) 0.05-0.25 % ophthalmic solution Place 1-2 drops into both eyes 3 (three) times daily as needed (for irritation).      No current facility-administered medications for this visit.    REVIEW OF SYSTEMS:  [X]  denotes positive finding, [ ]  denotes negative finding Cardiac  Comments:  Chest pain or chest pressure:    Shortness of breath upon exertion:    Short of breath when lying flat:    Irregular heart rhythm:        Vascular    Pain in calf, thigh, or hip brought on by ambulation:    Pain in feet at night that wakes you up from your sleep:     Blood clot in your veins:    Leg swelling:         Pulmonary    Oxygen at home:    Productive cough:     Wheezing:         Neurologic    Sudden weakness in arms or legs:     Sudden numbness in arms or legs:     Sudden onset of difficulty speaking or slurred speech:    Temporary loss of vision in one eye:     Problems with dizziness:         Gastrointestinal    Blood in stool:     Vomited blood:         Genitourinary    Burning when urinating:     Blood in urine:        Psychiatric    Major depression:         Hematologic    Bleeding problems:    Problems with blood clotting too easily:        Skin    Rashes or ulcers:        Constitutional    Fever or chills:      PHYSICAL EXAM: There were no vitals filed for this visit.  GENERAL: The patient is a well-nourished female, in no acute distress. The vital signs are documented above. CARDIAC: There is a regular rate and rhythm.  VASCULAR:  Right femoral pulse palpable Difficult to appreciate left femoral pulse Right AT palpable No lower extremity tissue loss PULMONARY: No respiratory distress. ABDOMEN: Soft and non-tender. MUSCULOSKELETAL: There are no major deformities or cyanosis. NEUROLOGIC: No focal weakness or  paresthesias are detected. PSYCHIATRIC: The patient has a normal affect.  DATA:   ABI's today are 0.79 right and 0.57 left  Left leg arterial duplex 09/07/22 shows high-grade common femoral stenosis with velocity 529 and SFA mid segment occlusion  Assessment/Plan:  77 y.o. female, with multiple comorbidities as listed that presents for 3 month follow-up after recent right leg angiogram.  She underwent angiogram on 07/29/2022 with concern for high-grade stenosis of her right common femoral to below-knee popliteal vein bypass.  No intervention was performed as no stenosis was identified.  She has a previous right common femoral to below-knee popliteal bypass with vein for CLI with rest pain on 09/03/2020.  Noted to have high-grade left common femoral stenosis versus subtotal occlusion on recent angiogram with some vague left leg symptoms.    We had previously  recommended conservative management of her left common femoral disease given vague symptoms.  Again I do not think she walks enough to have traditional claudication symptoms since she only walks in her home with a walker.  Most of her complaints sounds like neuropathy as well as joint pain.  Discussed with her daughter I do not think she would benefit from revascularization at this time.  I will continue to follow her closely.  She has no classic signs of CLI like rest pain or tissue loss in the left leg.  I will see her in 6 months with right leg arterial duplex to do surveillance of her bypass and we will do ABIs at that time.    Cephus Shelling, MD [facility] [place] Office: [phone]

## 2022-12-07 ENCOUNTER — Ambulatory Visit: Payer: Medicare HMO | Admitting: Vascular Surgery

## 2022-12-07 ENCOUNTER — Ambulatory Visit (HOSPITAL_COMMUNITY)
Admission: RE | Admit: 2022-12-07 | Discharge: 2022-12-07 | Disposition: A | Discharge status: Home / Self Care | Payer: Medicare HMO | Source: Ambulatory Visit | Attending: Vascular Surgery | Admitting: Vascular Surgery

## 2022-12-07 ENCOUNTER — Encounter: Payer: Self-pay | Admitting: Vascular Surgery

## 2022-12-07 VITALS — BP 145/85 | HR 93 | Temp 97.3°F | Resp 18 | Ht 65.0 in | Wt 173.9 lb

## 2022-12-07 DIAGNOSIS — I739 Peripheral vascular disease, unspecified: Secondary | ICD-10-CM | POA: Diagnosis not present

## 2022-12-07 LAB — VAS US ABI WITH/WO TBI
Left ABI: 0.57
Right ABI: 0.79

## 2022-12-31 ENCOUNTER — Other Ambulatory Visit: Payer: Self-pay

## 2022-12-31 DIAGNOSIS — I739 Peripheral vascular disease, unspecified: Secondary | ICD-10-CM

## 2023-01-14 ENCOUNTER — Encounter: Payer: Self-pay | Admitting: Neurology

## 2023-05-08 ENCOUNTER — Other Ambulatory Visit: Payer: Self-pay | Admitting: Neurology

## 2023-06-07 ENCOUNTER — Ambulatory Visit (HOSPITAL_COMMUNITY)
Admission: RE | Admit: 2023-06-07 | Discharge: 2023-06-07 | Disposition: A | Discharge status: Home / Self Care | Payer: Medicare HMO | Source: Ambulatory Visit | Attending: Vascular Surgery | Admitting: Vascular Surgery

## 2023-06-07 ENCOUNTER — Ambulatory Visit: Payer: Medicare HMO | Admitting: Vascular Surgery

## 2023-06-07 ENCOUNTER — Ambulatory Visit (INDEPENDENT_AMBULATORY_CARE_PROVIDER_SITE_OTHER)
Admission: RE | Admit: 2023-06-07 | Discharge: 2023-06-07 | Disposition: A | Discharge status: Home / Self Care | Payer: Medicare HMO | Source: Ambulatory Visit | Attending: Vascular Surgery | Admitting: Vascular Surgery

## 2023-06-07 ENCOUNTER — Encounter: Payer: Self-pay | Admitting: Vascular Surgery

## 2023-06-07 VITALS — BP 167/81 | HR 80 | Temp 97.8°F | Resp 18 | Ht 65.0 in | Wt 173.0 lb

## 2023-06-07 DIAGNOSIS — I739 Peripheral vascular disease, unspecified: Secondary | ICD-10-CM | POA: Insufficient documentation

## 2023-06-07 DIAGNOSIS — Z95828 Presence of other vascular implants and grafts: Secondary | ICD-10-CM

## 2023-06-07 DIAGNOSIS — I70221 Atherosclerosis of native arteries of extremities with rest pain, right leg: Secondary | ICD-10-CM | POA: Diagnosis not present

## 2023-06-07 LAB — VAS US ABI WITH/WO TBI
Left ABI: 0.49
Right ABI: 0.92

## 2023-06-07 NOTE — Progress Notes (Signed)
 Patient name: Heather Horton MRN: [id number] DOB: 07-07-1945 Sex: female  REASON FOR CONSULT: 99-month follow-up PAD  HPI: Heather Horton is a 78 y.o. female, with multiple comorbidities as listed below that presents for 6 month follow-up of her PAD.  Today complaining of mostly pain in her right knee when standing for prolonged periods of time.  States her left leg is doing okay.  Does not walk much and has a walker today.  She underwent angiogram on 07/29/2022 with concern for high-grade stenosis of her right common femoral to below-knee popliteal vein bypass.  No intervention was performed as no stenosis was identified.  She had a previous right common femoral to below-knee popliteal bypass with vein for CLI with rest pain on 09/03/2020.      Past Medical History:  Diagnosis Date   Acute encephalopathy    Allergy    Anemia    Anxiety    Arthritis    Asthma    Blood transfusion without reported diagnosis    Bronchitis    Bursitis    Cervical cancer (HCC)    Chronic constipation    Chronic hepatitis C without hepatic coma (HCC) 10/17/2014   11/10/2015 visit with Dr. Luciana Axe. Repeat labs revealed resolution of infection following treatment.  [facility], Alisia Ferrari, [place], FNP-C; last seen 01/2014 Genotype 1b Diagnosed 01/2013   Chronic kidney disease    stage 3   Colon polyps    Family history of adverse reaction to anesthesia    daughter post-op nausea and vomiting   Fibrocystic breast disease    Fibromyalgia    diagnosed years ago per pt   Galactorrhea on right side    GERD (gastroesophageal reflux disease)    Headache    migraines in the past   Hepatitis C    Hyperlipidemia    Hypertension    Insomnia    Neuromuscular disorder (HCC)    Osteoporosis    Respiratory failure (HCC) 03/20/2017   Seizures (HCC)     Past Surgical History:  Procedure Laterality Date   ABDOMINAL AORTOGRAM W/LOWER EXTREMITY Right 08/14/2020   Procedure:  ABDOMINAL AORTOGRAM W/LOWER EXTREMITY;  Surgeon: Cephus Shelling, MD;  Location: [facility];  Service: Cardiovascular;  Laterality: Right;   ABDOMINAL AORTOGRAM W/LOWER EXTREMITY N/A 05/07/2021   Procedure: ABDOMINAL AORTOGRAM W/LOWER EXTREMITY;  Surgeon: Cephus Shelling, MD;  Location: [facility];  Service: Cardiovascular;  Laterality: N/A;   ABDOMINAL AORTOGRAM W/LOWER EXTREMITY N/A 07/29/2022   Procedure: ABDOMINAL AORTOGRAM W/LOWER EXTREMITY;  Surgeon: Cephus Shelling, MD;  Location: [facility];  Service: Cardiovascular;  Laterality: N/A;   ABDOMINAL HYSTERECTOMY  30's   due to cervical cancer   APPENDECTOMY     BIOPSY  01/29/2020   Procedure: BIOPSY;  Surgeon: Meridee Score Netty Starring., MD;  Location: [facility] ENDOSCOPY;  Service: Gastroenterology;;   DILATION AND CURETTAGE OF UTERUS     pt says years ago when living in [place]   ESOPHAGOGASTRODUODENOSCOPY (EGD) WITH PROPOFOL N/A 01/29/2020   Procedure: ESOPHAGOGASTRODUODENOSCOPY (EGD) WITH PROPOFOL;  Surgeon: Lemar Lofty., MD;  Location: [facility] ENDOSCOPY;  Service: Gastroenterology;  Laterality: N/A;   FEMORAL-POPLITEAL BYPASS GRAFT Right 09/03/2020   Procedure: BYPASS GRAFT FEMORAL-POPLITEAL ARTERY USING NON-REVERSED GREATER SAPHENOUS VEIN RIGHT;  Surgeon: Cephus Shelling, MD;  Location: [facility] OR;  Service: Vascular;  Laterality: Right;   PERIPHERAL VASCULAR BALLOON ANGIOPLASTY  05/07/2021   Procedure: PERIPHERAL VASCULAR BALLOON ANGIOPLASTY;  Surgeon: Chestine Spore,  Canary Brim, MD;  Location: [facility];  Service: Cardiovascular;;  Distal Bypass   POLYPECTOMY     SAVORY DILATION N/A 01/29/2020   Procedure: Michiel Cowboy DILATION;  Surgeon: Lemar Lofty., MD;  Location: [facility] ENDOSCOPY;  Service: Gastroenterology;  Laterality: N/A;    Family History  Problem Relation Age of Onset   Asthma Mother    Colon cancer Sister 68   Stroke Brother 55   Stroke Sister 31   Cancer Sister 39       lung  cancer, +TOBACCO   Arthritis Daughter    Pulmonary embolism Daughter 48       on chronic warfarin   Hypertension Son 43   Esophageal cancer Neg Hx    Rectal cancer Neg Hx    Stomach cancer Neg Hx     SOCIAL HISTORY: Social History   Socioeconomic History   Marital status: Divorced    Spouse name: Not on file   Number of children: 3   Years of education: 14+   Highest education level: Not on file  Occupational History   Occupation: retired    Comment: phlebotomy, EKG tech; taught both  Tobacco Use   Smoking status: Every Day    Current packs/day: 0.50    Average packs/day: 0.5 packs/day for 40.0 years (20.0 ttl pk-yrs)    Types: Cigarettes    Passive exposure: Never   Smokeless tobacco: Never   Tobacco comments:    I'm scared of e-cigarettes, interested in patches, tobacco info given 01/10/15  Vaping Use   Vaping status: Never Used  Substance and Sexual Activity   Alcohol use: Yes    Alcohol/week: 2.0 standard drinks of alcohol    Types: 2 Glasses of wine per week   Drug use: No   Sexual activity: Not Currently  Other Topics Concern   Not on file  Social History Narrative   Raised in [place], [place].   Moved to [place], [place] and then came to [place] in 2013.   Lives alone.   Daughter lives around the corner.    Raised her niece as her own daughter, and she lives nearby.   Son lives in [place].   Right handed   Social Drivers of Health   Financial Resource Strain: Low Risk  (04/19/2023)   [facility]   Overall Financial Resource Strain (CARDIA)    Difficulty of Paying Living Expenses: Not hard at all  Food Insecurity: No Food Insecurity (04/19/2023)   Received from [facility]   Hunger Vital Sign    Worried About Running Out of Food in the Last Year: Never true    Ran Out of Food in the Last Year: Never true  Transportation Needs: No Transportation Needs (04/19/2023)   Received from [facility] - Transportation    Lack of  Transportation (Medical): No    Lack of Transportation (Non-Medical): No  Physical Activity: Sufficiently Active (04/19/2023)   Received from [facility]   Exercise Vital Sign    Days of Exercise per Week: 7 days    Minutes of Exercise per Session: 30 min  Stress: No Stress Concern Present (04/19/2023)   Received from [facility] - Occupational Stress Questionnaire    Feeling of Stress : Not at all  Social Connections: Socially Integrated (04/19/2023)   [facility]   Social Network    How would you rate your social network (family, work, friends)?:  Good participation with social networks  Intimate Partner Violence: Not At Risk (04/16/2022)   Received from [facility]   HITS    Over the last 12 months how often did your partner physically hurt you?: Never    Over the last 12 months how often did your partner insult you or talk down to you?: Never    Over the last 12 months how often did your partner threaten you with physical harm?: Never    Over the last 12 months how often did your partner scream or curse at you?: Never    Allergies  Allergen Reactions   Latex Itching   Molds & Smuts Other (See Comments)    Per allergy test   Pollen Extract Other (See Comments)    Per allergy test   Aspirin Nausea And Vomiting and Rash    Current Outpatient Medications  Medication Sig Dispense Refill   albuterol (PROVENTIL HFA;VENTOLIN HFA) 108 (90 Base) MCG/ACT inhaler Inhale 2 puffs into the lungs every 6 (six) hours as needed for wheezing. 1 Inhaler 1   atorvastatin (LIPITOR) 40 MG tablet TAKE 1 TABLET BY MOUTH EVERY DAY (Patient taking differently: Take 40 mg by mouth daily in the afternoon.) 90 tablet 3   Budesonide (PULMICORT FLEXHALER) 90 MCG/ACT inhaler Inhale 1 puff into the lungs 2 (two) times daily as needed (shortness of breath).     busPIRone (BUSPAR) 5 MG tablet Take 5 mg by mouth 3 (three) times daily.      Capsaicin (MUSCLE RELIEF EX) Apply 1 application topically daily as needed (muscle cramps).     clopidogrel (PLAVIX) 75 MG tablet Take 75 mg by mouth daily.     cyanocobalamin (VITAMIN B12) 1000 MCG/ML injection 1000 MCG (1 ML) DAILY FOR 1 WEEK, THEN WEEKLY FOR 1 MONTH, THEN MONTHLY FOR A YEAR 3 mL 3   diclofenac Sodium (VOLTAREN) 1 % GEL Apply 2 g topically 4 (four) times daily. (Patient taking differently: Apply 2 g topically daily as needed (Pain).) 50 g 1   donepezil (ARICEPT) 5 MG tablet TAKE 1 TABLET DAILY 90 tablet 0   ergocalciferol (VITAMIN D2) 1.25 MG (50000 UT) capsule Take 50,000 Units by mouth every Wednesday.      folic acid (FOLVITE) 400 MCG tablet Take 400 mcg by mouth daily.     gabapentin (NEURONTIN) 300 MG capsule Take 300-600 mg by mouth See admin instructions. Take 300 mg morning and noon and 600 mg at bedtime     hydrochlorothiazide (HYDRODIURIL) 25 MG tablet Take 25 mg by mouth every morning.     HYDROcodone-acetaminophen (NORCO) 10-325 MG tablet Take 1 tablet by mouth every 8 (eight) hours as needed for pain 90 tablet 0   ketoconazole (NIZORAL) 2 % cream Apply 1 application topically daily as needed for irritation.     levETIRAcetam (KEPPRA) 500 MG tablet Take 1 tablet (500 mg total) by mouth 2 (two) times daily. 180 tablet 3   loratadine (CLARITIN) 10 MG tablet Take 10 mg by mouth daily as needed for allergies or rhinitis.     pantoprazole (PROTONIX) 40 MG tablet Take 40 mg by mouth at bedtime.     QUEtiapine (SEROQUEL) 300 MG tablet Take 1 tablet (300 mg total) by mouth at bedtime. 90 tablet 3   sharps container 1 each by Does not apply route as needed. 1 each 0   SYRINGE-NEEDLE, DISP, 3 ML (B-D 3CC LUER-LOK SYR 25GX1/2") 25G X 1-1/2" 3 ML MISC Use with B 12  injection 50 each 0   tetrahydrozoline-zinc (VISINE-AC) 0.05-0.25 % ophthalmic solution Place 1-2 drops into both eyes 3 (three) times daily as needed (for irritation).      No current facility-administered  medications for this visit.    REVIEW OF SYSTEMS:  [X]  denotes positive finding, [ ]  denotes negative finding Cardiac  Comments:  Chest pain or chest pressure:    Shortness of breath upon exertion:    Short of breath when lying flat:    Irregular heart rhythm:        Vascular    Pain in calf, thigh, or hip brought on by ambulation:    Pain in feet at night that wakes you up from your sleep:     Blood clot in your veins:    Leg swelling:         Pulmonary    Oxygen at home:    Productive cough:     Wheezing:         Neurologic    Sudden weakness in arms or legs:     Sudden numbness in arms or legs:     Sudden onset of difficulty speaking or slurred speech:    Temporary loss of vision in one eye:     Problems with dizziness:         Gastrointestinal    Blood in stool:     Vomited blood:         Genitourinary    Burning when urinating:     Blood in urine:        Psychiatric    Major depression:         Hematologic    Bleeding problems:    Problems with blood clotting too easily:        Skin    Rashes or ulcers:        Constitutional    Fever or chills:      PHYSICAL EXAM: Vitals:   06/07/23 1433  BP: (!) 167/81  Pulse: 80  Resp: 18  Temp: 97.8 F (36.6 C)  TempSrc: Temporal  SpO2: 98%  Weight: 173 lb (78.5 kg)  Height: 5\' 5"  (1.651 m)    GENERAL: The patient is a well-nourished female, in no acute distress. The vital signs are documented above. CARDIAC: There is a regular rate and rhythm.  VASCULAR:  Right femoral pulse palpable Difficult to appreciate left femoral pulse Brisk monophasic signals in the right foot with no tissue loss PULMONARY: No respiratory distress. ABDOMEN: Soft and non-tender. MUSCULOSKELETAL: There are no major deformities or cyanosis. NEUROLOGIC: No focal weakness or paresthesias are detected. PSYCHIATRIC: The patient has a normal affect.  DATA:   ABI's today are 0.92 right and 0.49 left (previously 0.79 right and 0.57  left)  Lower extremity arterial duplex concern for moderate inflow disease with proximal anastomosis velocity of 367 concerning for greater than 70% stenosis in right leg bypass (this has been as high as over 500 in the past prior to angiogram when no stenosis was identified.)  Assessment/Plan:  78 y.o. female, with multiple comorbidities as listed that presents for 6 month follow-up after of her PAD.  She underwent angiogram on 07/29/2022 with concern for high-grade stenosis of her right common femoral to below-knee popliteal vein bypass.  No intervention was performed as no stenosis was identified.  She has a previous right common femoral to below-knee popliteal bypass with vein for CLI with rest pain on 09/03/2020.    On follow-up today most of  her complaints are joint pain and neuropathy.  She has no classic PAD complaints and really does not walk enough to claudicate with no rest pain or tissue loss.  We have previously done an angiogram to evaluate the concern for stenosis in her right proximal bypass and there is a big discrepancy between the hood of her bypass and the size of her  common femoral artery that may be leading to these falsely elevated velocities (no stenosis previously identified).  Will continue to follow.  I will see her in 6 months with repeat right leg arterial duplex.  No left leg complaints today.    Cephus Shelling, MD [facility] Office: [phone]   Cephus Shelling

## 2023-06-10 ENCOUNTER — Other Ambulatory Visit: Payer: Self-pay | Admitting: *Deleted

## 2023-06-10 DIAGNOSIS — I739 Peripheral vascular disease, unspecified: Secondary | ICD-10-CM

## 2023-06-10 DIAGNOSIS — Z95828 Presence of other vascular implants and grafts: Secondary | ICD-10-CM

## 2023-06-17 ENCOUNTER — Ambulatory Visit: Payer: Medicare HMO | Admitting: Neurology

## 2023-06-20 ENCOUNTER — Ambulatory Visit: Payer: Medicare HMO | Admitting: Neurology

## 2023-06-29 ENCOUNTER — Encounter: Payer: Self-pay | Admitting: Neurology

## 2023-06-29 ENCOUNTER — Encounter: Payer: Medicare HMO | Admitting: Neurology

## 2023-06-29 NOTE — Progress Notes (Signed)
 error    This encounter was created in error - please disregard.

## 2023-07-05 ENCOUNTER — Ambulatory Visit: Admitting: Neurology

## 2023-07-05 ENCOUNTER — Encounter: Payer: Self-pay | Admitting: Neurology

## 2023-07-05 ENCOUNTER — Other Ambulatory Visit

## 2023-07-05 VITALS — BP 148/75 | HR 80 | Ht 65.0 in | Wt 169.8 lb

## 2023-07-05 DIAGNOSIS — E538 Deficiency of other specified B group vitamins: Secondary | ICD-10-CM | POA: Diagnosis not present

## 2023-07-05 DIAGNOSIS — F0390 Unspecified dementia without behavioral disturbance: Secondary | ICD-10-CM | POA: Diagnosis not present

## 2023-07-05 DIAGNOSIS — G40009 Localization-related (focal) (partial) idiopathic epilepsy and epileptic syndromes with seizures of localized onset, not intractable, without status epilepticus: Secondary | ICD-10-CM

## 2023-07-05 MED ORDER — DONEPEZIL HCL 5 MG PO TABS: ORAL_TABLET | ORAL | 3 refills | Status: DC

## 2023-07-05 MED ORDER — MEMANTINE HCL 10 MG PO TABS: ORAL_TABLET | ORAL | 3 refills | Status: DC

## 2023-07-05 MED ORDER — LEVETIRACETAM 500 MG PO TABS: 500.0000 mg | ORAL_TABLET | Freq: Two times a day (BID) | ORAL | 3 refills | Status: DC

## 2023-07-05 NOTE — Progress Notes (Signed)
 NEUROLOGY FOLLOW UP OFFICE NOTE  Heather Horton [id number] Aug 29, 1945  HISTORY OF PRESENT ILLNESS: I had the pleasure of seeing Heather Horton in follow-up in the neurology clinic on 07/05/2023.  The patient was last seen a year ago for seizures and memory loss. She is again accompanied by her daughter Heather Horton who helps supplement the history today.  Records and images were personally reviewed where available.  MMSE 22/30 in 05/2022. She is on Donepezil  5mg  daily without side effects. Her B12 level was low in 05/2022 (183), TSH normal. She was started on B12 injection, last dose was a month ago. Her daughter reports memory is not great, it has progressed a little bit but not much change in the past year. Her daughter manages medications, finances, meals. She does not drive. No seizures since 2019 on Levetiracetam  500mg  BID, no side effects.   She has had a headache for the past few weeks, she reports one the other day and today with pain across the eyes. She had vomiting the other night for no apparent reason, but she had not eaten saying she was not hungry, then vomited. Tylenol  does not help the headaches. She has neuropathy and takes Gabapentin . No hallucinations or paranoia. She dreams a lot and calls out at night.She gets frustrated when she cannot recall what she wanted to say. She lays around a lot, with pain and swelling in her knees and legs. No falls.    History on Initial Assessment 06/28/2017: This is a 78 year old right-handed woman with a history of hypertension, hyperlipidemia, hepatitis C, cervical cancer, presenting for evaluation of possible seizure. She was admitted to [facility] last 03/20/2017 for altered mental status. She lives alone and recalls getting ready for bed feeling fine, then waking up in the hospital. Her daughter visits her daily except on weekends, she had seen her Friday and spoke to her on the phone the day prior. Her daughter came Sunday noon and found her naked on the floor. She  had fallen into a table in the den and daughter found her moaning with eyes rolling back, her right leg and arm were "flinching." They found her bed clothes folded on top of the bed. No tongue bite or incontinence. Her daughter feels she was down for a long time because her lips were so dry. She was brought to [facility] where CBC showed WBC 10.9, MCV 101.5, CMP showed mildly elevated creatinine 1.04, CK level was elevated at 1528. UDS was positive for opioids. She had an MRI brain with and without contrast which I personally reviewed, no acute changes. EEG showed moderate diffuse background slowing, no epileptiform discharges. It was noted that symptoms were most likely resolving toxic metabolic encephalopathy in the setting of opiate overuse, however since unclear if seizure-related, she was continued on Keppra  500mg  BID.   Since hospital discharge, her daughter reports she is back to baseline. Her daughter now administers medications. Her daughter reports that prior to hospitalization, she was taking Tramadol , Tizanidine, and Trazodone . There is note of hydrocodone , however her daughter did not find any hydrocodone  bottle in the house. She states she has not taken it that day. She is now on Tramadol  TID which does not help with pain from fibromyalgia and osteoarthritis. She is scheduled to see Pain Management. Her daughter denies any staring/unresponsive episodes, she denies any other gaps in time, olfactory/gustatory hallucinations, myoclonic jerks. She has tingling in her left arm and both feet L>R with associated pain. She has swelling in both  knees. She does not drive. She has a history of spinal meningitis at age 78. Otherwise she had a normal birth and early development.  There is no history of febrile convulsions, significant traumatic brain injury, neurosurgical procedures, or family history of seizures.  Diagnostic Data:  MRI brain in 03/2017 was unremarkable MRI brain in 12/2019 was unremarkable  EEG in  03/2017 showed moderate diffuse slowing 24-hour EEG in June 2019 which was abnormal, showing occasional left mid-temporal epileptiform discharges during sleep, typical events were not captured.   PAST MEDICAL HISTORY: Past Medical History:  Diagnosis Date   Acute encephalopathy    Allergy    Anemia    Anxiety    Arthritis    Asthma    Blood transfusion without reported diagnosis    Bronchitis    Bursitis    Cervical cancer (HCC)    Chronic constipation    Chronic hepatitis C without hepatic coma (HCC) 10/17/2014   11/10/2015 visit with Dr. Seymour Dapper. Repeat labs revealed resolution of infection following treatment.  [employer] [facility], Vara Gentle, [place], FNP-C; last seen 01/2014 Genotype 1b Diagnosed 01/2013   Chronic kidney disease    stage 3   Colon polyps    Family history of adverse reaction to anesthesia    daughter post-op nausea and vomiting   Fibrocystic breast disease    Fibromyalgia    diagnosed years ago per pt   Galactorrhea on right side    GERD (gastroesophageal reflux disease)    Headache    migraines in the past   Hepatitis C    Hyperlipidemia    Hypertension    Insomnia    Neuromuscular disorder (HCC)    Osteoporosis    Respiratory failure (HCC) 03/20/2017   Seizures (HCC)     MEDICATIONS: Current Outpatient Medications on File Prior to Visit  Medication Sig Dispense Refill   albuterol  (PROVENTIL  HFA;VENTOLIN  HFA) 108 (90 Base) MCG/ACT inhaler Inhale 2 puffs into the lungs every 6 (six) hours as needed for wheezing. 1 Inhaler 1   atorvastatin  (LIPITOR) 40 MG tablet TAKE 1 TABLET BY MOUTH EVERY DAY (Patient taking differently: Take 40 mg by mouth daily in the afternoon.) 90 tablet 3   busPIRone  (BUSPAR ) 10 MG tablet Take 10 mg by mouth 3 (three) times daily.     Capsaicin (MUSCLE RELIEF EX) Apply 1 application topically daily as needed (muscle cramps).     clopidogrel  (PLAVIX ) 75 MG tablet Take 75 mg by mouth daily.      cyanocobalamin  (VITAMIN B12) 1000 MCG/ML injection 1000 MCG (1 ML) DAILY FOR 1 WEEK, THEN WEEKLY FOR 1 MONTH, THEN MONTHLY FOR A YEAR 3 mL 3   diclofenac  Sodium (VOLTAREN ) 1 % GEL Apply 2 g topically 4 (four) times daily. (Patient taking differently: Apply 2 g topically daily as needed (Pain).) 50 g 1   donepezil  (ARICEPT ) 5 MG tablet TAKE 1 TABLET DAILY 90 tablet 0   ergocalciferol  (VITAMIN D2) 1.25 MG (50000 UT) capsule Take 50,000 Units by mouth every Wednesday.      folic acid  (FOLVITE ) 400 MCG tablet Take 400 mcg by mouth daily.     gabapentin  (NEURONTIN ) 300 MG capsule Take 300-600 mg by mouth See admin instructions. Take 300 mg morning and noon and 600 mg at bedtime     hydrochlorothiazide  (HYDRODIURIL ) 25 MG tablet Take 25 mg by mouth every morning.     HYDROcodone -acetaminophen  (NORCO) 10-325 MG tablet Take 1 tablet by mouth every 8 (eight) hours  as needed for pain 90 tablet 0   ketoconazole  (NIZORAL ) 2 % cream Apply 1 application topically daily as needed for irritation.     levETIRAcetam  (KEPPRA ) 500 MG tablet Take 1 tablet (500 mg total) by mouth 2 (two) times daily. 180 tablet 3   loratadine  (CLARITIN ) 10 MG tablet Take 10 mg by mouth daily as needed for allergies or rhinitis.     pantoprazole  (PROTONIX ) 40 MG tablet Take 40 mg by mouth at bedtime.     QUEtiapine  (SEROQUEL ) 300 MG tablet Take 1 tablet (300 mg total) by mouth at bedtime. 90 tablet 3   sharps container 1 each by Does not apply route as needed. 1 each 0   SYRINGE-NEEDLE, DISP, 3 ML (B-D 3CC LUER-LOK SYR 25GX1/2") 25G X 1-1/2" 3 ML MISC Use with B 12 injection 50 each 0   tetrahydrozoline-zinc (VISINE-AC) 0.05-0.25 % ophthalmic solution Place 1-2 drops into both eyes 3 (three) times daily as needed (for irritation).      Budesonide (PULMICORT FLEXHALER) 90 MCG/ACT inhaler Inhale 1 puff into the lungs 2 (two) times daily as needed (shortness of breath). (Patient not taking: Reported on 07/05/2023)     No current  facility-administered medications on file prior to visit.    ALLERGIES: Allergies  Allergen Reactions   Latex Itching   Molds & Smuts Other (See Comments)    Per allergy test   Pollen Extract Other (See Comments)    Per allergy test   Aspirin  Nausea And Vomiting and Rash    FAMILY HISTORY: Family History  Problem Relation Age of Onset   Asthma Mother    Colon cancer Sister 21   Stroke Brother 5   Stroke Sister 81   Cancer Sister 22       lung cancer, +TOBACCO   Arthritis Daughter    Pulmonary embolism Daughter 54       on chronic warfarin   Hypertension Son 24   Esophageal cancer Neg Hx    Rectal cancer Neg Hx    Stomach cancer Neg Hx     SOCIAL HISTORY: Social History   Socioeconomic History   Marital status: Divorced    Spouse name: Not on file   Number of children: 3   Years of education: 14+   Highest education level: Not on file  Occupational History   Occupation: retired    Comment: phlebotomy, EKG tech; taught both  Tobacco Use   Smoking status: Every Day    Current packs/day: 0.50    Average packs/day: 0.5 packs/day for 40.0 years (20.0 ttl pk-yrs)    Types: Cigarettes    Passive exposure: Never   Smokeless tobacco: Never   Tobacco comments:    I'm scared of e-cigarettes, interested in patches, tobacco info given 01/10/15  Vaping Use   Vaping status: Never Used  Substance and Sexual Activity   Alcohol use: Yes    Alcohol/week: 2.0 standard drinks of alcohol    Types: 2 Glasses of wine per week   Drug use: No   Sexual activity: Not Currently  Other Topics Concern   Not on file  Social History Narrative   Raised in [place], [place].   Moved to [place], [place] and then came to [place] in 2013.   Lives alone.   Daughter lives around the corner.    Raised her niece as her own daughter, and she lives nearby.   Son lives in [place] .   Right handed   Social Drivers of Health  Financial Resource Strain: Low Risk  (04/19/2023)   [facility]   Overall Financial Resource Strain (CARDIA)    Difficulty of Paying Living Expenses: Not hard at all  Food Insecurity: No Food Insecurity (04/19/2023)   Received from [facility]   Hunger Vital Sign    Worried About Running Out of Food in the Last Year: Never true    Ran Out of Food in the Last Year: Never true  Transportation Needs: No Transportation Needs (04/19/2023)   Received from [facility] - Transportation    Lack of Transportation (Medical): No    Lack of Transportation (Non-Medical): No  Physical Activity: Sufficiently Active (04/19/2023)   Received from [facility]   Exercise Vital Sign    Days of Exercise per Week: 7 days    Minutes of Exercise per Session: 30 min  Stress: No Stress Concern Present (04/19/2023)   Received from [facility] - Occupational Stress Questionnaire    Feeling of Stress : Not at all  Social Connections: Socially Integrated (04/19/2023)   Received from [facility]   Social Network    How would you rate your social network (family, work, friends)?: Good participation with social networks  Intimate Partner Violence: Not At Risk (04/16/2022)   [facility] from [facility]   HITS    Over the last 12 months how often did your partner physically hurt you?: Never    Over the last 12 months how often did your partner insult you or talk down to you?: Never    Over the last 12 months how often did your partner threaten you with physical harm?: Never    Over the last 12 months how often did your partner scream or curse at you?: Never     PHYSICAL EXAM: Vitals:   07/05/23 1411  BP: (!) 148/75  Pulse: 80  SpO2: 98%   General: No acute distress Head:  Normocephalic/atraumatic Skin/Extremities: No rash, no edema Neurological Exam: alert and oriented to person, place, and time. No aphasia or dysarthria. Fund of knowledge is appropriate.  Recent and remote memory are  intact.  Attention and concentration are reduced. MMSE 25/30    07/05/2023    2:00 PM 06/16/2022    3:00 PM  MMSE - Mini Mental State Exam  Orientation to time 5 4  Orientation to Place 5 5  Registration 3 3  Attention/ Calculation 0 1  Recall 3 2  Language- name 2 objects 2 2  Language- repeat 1 1  Language- follow 3 step command 3 2  Language- read & follow direction 1 1  Write a sentence 1 1  Copy design 1 0  Total score 25 22   Cranial nerves: Pupils equal, round. Extraocular movements intact with no nystagmus. Visual fields full.  No facial asymmetry.  Motor: Bulk and tone normal, muscle strength 5/5 throughout with no pronator drift.   Finger to nose testing intact.  Gait narrow-based and steady, no ataxia.    IMPRESSION: This is a 78yo RH woman with a history of f hypertension, hyperlipidemia, hepatitis C, cervical cancer, with left temporal lobe epilepsy diagnosed after an episode of altered mental status in 2019. MRI brain at that time normal. Her 24-hour EEG was abnormal with occasional left mid-temporal epileptiform discharges. She has been seizure-free since 2019, continue Levetiracetam  500mg  BID. Her daughter reports slight decline in the past year, she has been having increased frustration.  MMSE today 25/30 (22/30 in 05/2022). Continue Donepezil  5mg , take in the morning and monitor if this helps with vivid dreams. We discussed adding on Memantine  10mg : take 1 table daily for 2 weeks, then increase to 1 tablet BID, side effects discussed. This ma help with headaches as well. We discussed minimizing headache rescue medication to 2-3 a week to avoid rebound headaches. Continue close supervision. Follow-up in 6 months, call for any changes.   Thank you for allowing me to participate in her care.  Please do not hesitate to call for any questions or concerns.    Rayfield Cairo, M.D.   CC: Aldine Humphreys, [place]

## 2023-07-05 NOTE — Patient Instructions (Addendum)
 Good to see you!  Start Memantine 10mg : Take 1 tablet every morning for 2 weeks, then increase to 1 tablet twice a day  2. Take the Donepezil  5mg  every morning and see if this helps cut down on the vivid dreams  3. Continue Levetiracetam  (Keppra ) 500mg  twice a day  4. You can take Aleve  as needed THREE TIMES A WEEK only to help with the headaches  5. Follow-up in 6 months, call for any changes   Seizure Precautions: 1. If medication has been prescribed for you to prevent seizures, take it exactly as directed.  Do not stop taking the medicine without talking to your doctor first, even if you have not had a seizure in a long time.   2. Avoid activities in which a seizure would cause danger to yourself or to others.  Don't operate dangerous machinery, swim alone, or climb in high or dangerous places, such as on ladders, roofs, or girders.  Do not drive unless your doctor says you may.  3. If you have any warning that you may have a seizure, lay down in a safe place where you can't hurt yourself.    4.  No driving for 6 months from last seizure, as per [place]  state law.   Please refer to the following link on the Epilepsy Foundation of America's website for more information: http://www.epilepsyfoundation.org/answerplace/Social/driving/drivingu.cfm   5.  Maintain good sleep hygiene.  6.  Contact your doctor if you have any problems that may be related to the medicine you are taking.  7.  Call 911 and bring the patient back to the ED if:        A.  The seizure lasts longer than 5 minutes.       B.  The patient doesn't awaken shortly after the seizure  C.  The patient has new problems such as difficulty seeing, speaking or moving  D.  The patient was injured during the seizure  E.  The patient has a temperature over 102 F (39C)  F.  The patient vomited and now is having trouble breathing   FALL PRECAUTIONS: Be cautious when walking. Scan the area for obstacles that may increase  the risk of trips and falls. When getting up in the mornings, sit up at the edge of the bed for a few minutes before getting out of bed. Consider elevating the bed at the head end to avoid drop of blood pressure when getting up. Walk always in a well-lit room (use night lights in the walls). Avoid area rugs or power cords from appliances in the middle of the walkways. Use a walker or a cane if necessary and consider physical therapy for balance exercise. Get your eyesight checked regularly.  HOME SAFETY: Consider the safety of the kitchen when operating appliances like stoves, microwave oven, and blender. Consider having supervision and share cooking responsibilities until no longer able to participate in those. Accidents with firearms and other hazards in the house should be identified and addressed as well.  ABILITY TO BE LEFT ALONE: If patient is unable to contact 911 operator, consider using LifeLine, or when the need is there, arrange for someone to stay with patients. Smoking is a fire hazard, consider supervision or cessation. Risk of wandering should be assessed by caregiver and if detected at any point, supervision and safe proof recommendations should be instituted.   RECOMMENDATIONS FOR ALL PATIENTS WITH MEMORY PROBLEMS: 1. Continue to exercise (Recommend 30 minutes of walking everyday, or 3 hours  every week) 2. Increase social interactions - continue going to [place] and enjoy social gatherings with friends and family 3. Eat healthy, avoid fried foods and eat more fruits and vegetables 4. Maintain adequate blood pressure, blood sugar, and blood cholesterol level. Reducing the risk of stroke and cardiovascular disease also helps promoting better memory. 5. Avoid stressful situations. Live a simple life and avoid aggravations. Organize your time and prepare for the next day in anticipation. 6. Sleep well, avoid any interruptions of sleep and avoid any distractions in the bedroom that may  interfere with adequate sleep quality 7. Avoid sugar, avoid sweets as there is a strong link between excessive sugar intake, diabetes, and cognitive impairment The Mediterranean diet has been shown to help patients reduce the risk of progressive memory disorders and reduces cardiovascular risk. This includes eating fish, eat fruits and green leafy vegetables, nuts like almonds and hazelnuts, walnuts, and also use olive oil. Avoid fast foods and fried foods as much as possible. Avoid sweets and sugar as sugar use has been linked to worsening of memory function.  There is always a concern of gradual progression of memory problems. If this is the case, then we may need to adjust level of care according to patient needs. Support, both to the patient and caregiver, should then be put into place.

## 2023-07-06 LAB — VITAMIN B12: Vitamin B-12: 1360 pg/mL — ABNORMAL HIGH (ref 200–1100)

## 2023-07-11 ENCOUNTER — Telehealth: Payer: Self-pay

## 2023-07-11 NOTE — Telephone Encounter (Signed)
 Spoke with pt daughter informed her that Ms Heather Horton B12 level looks good. After she finishes the year of injections, would just do over the counter 500mcg daily supplement

## 2023-07-11 NOTE — Telephone Encounter (Signed)
-----   Message from Heather Horton sent at 07/06/2023 12:13 PM EDT ----- Pls let them know B12 level looks good. After she finishes the year of injections, would just do over the counter 500mcg daily supplement. Thanks

## 2023-09-12 ENCOUNTER — Telehealth: Payer: Self-pay

## 2023-09-12 NOTE — Telephone Encounter (Signed)
 [facility] Triage Nurse called patient to advise contacting PCP to get Atorvastatin  prescription refilled. Pt verbally agreed.

## 2023-09-20 ENCOUNTER — Other Ambulatory Visit: Payer: Self-pay | Admitting: Physician Assistant

## 2023-09-20 DIAGNOSIS — Z1231 Encounter for screening mammogram for malignant neoplasm of breast: Secondary | ICD-10-CM

## 2023-10-22 ENCOUNTER — Other Ambulatory Visit: Payer: Self-pay | Admitting: Neurology

## 2023-10-24 ENCOUNTER — Ambulatory Visit
Admission: RE | Admit: 2023-10-24 | Discharge: 2023-10-24 | Disposition: A | Discharge status: Home / Self Care | Source: Ambulatory Visit | Attending: Physician Assistant | Admitting: Physician Assistant

## 2023-10-24 DIAGNOSIS — Z1231 Encounter for screening mammogram for malignant neoplasm of breast: Secondary | ICD-10-CM

## 2023-11-21 ENCOUNTER — Other Ambulatory Visit: Payer: Self-pay | Admitting: Vascular Surgery

## 2023-11-21 DIAGNOSIS — I739 Peripheral vascular disease, unspecified: Secondary | ICD-10-CM

## 2023-11-21 DIAGNOSIS — Z95828 Presence of other vascular implants and grafts: Secondary | ICD-10-CM

## 2023-12-02 ENCOUNTER — Ambulatory Visit (HOSPITAL_COMMUNITY)
Admission: RE | Admit: 2023-12-02 | Discharge: 2023-12-02 | Disposition: A | Discharge status: Home / Self Care | Source: Ambulatory Visit | Attending: Vascular Surgery | Admitting: Vascular Surgery

## 2023-12-02 DIAGNOSIS — Z95828 Presence of other vascular implants and grafts: Secondary | ICD-10-CM | POA: Insufficient documentation

## 2023-12-02 DIAGNOSIS — I739 Peripheral vascular disease, unspecified: Secondary | ICD-10-CM

## 2023-12-05 LAB — VAS US ABI WITH/WO TBI
Left ABI: 0.56
Right ABI: 0.83

## 2023-12-06 ENCOUNTER — Encounter (HOSPITAL_COMMUNITY)

## 2023-12-06 ENCOUNTER — Other Ambulatory Visit (HOSPITAL_COMMUNITY)

## 2023-12-06 ENCOUNTER — Encounter: Payer: Self-pay | Admitting: Vascular Surgery

## 2023-12-06 ENCOUNTER — Ambulatory Visit: Attending: Vascular Surgery | Admitting: Vascular Surgery

## 2023-12-06 VITALS — BP 130/75 | HR 87 | Temp 98.2°F | Resp 20 | Ht 65.0 in | Wt 178.1 lb

## 2023-12-06 DIAGNOSIS — I70221 Atherosclerosis of native arteries of extremities with rest pain, right leg: Secondary | ICD-10-CM | POA: Diagnosis not present

## 2023-12-06 NOTE — Progress Notes (Signed)
 Patient name: Heather Horton MRN: [id number] DOB: 12-31-1945 Sex: female  REASON FOR CONSULT: 8-month follow-up PAD  HPI: Heather Horton is a 78 y.o. female, with multiple comorbidities as listed below that presents for 6 month follow-up of her PAD.    She had a previous right common femoral to below-knee popliteal bypass with vein for CLI with rest pain on 09/03/2020.  She underwent angiogram on 07/29/2022 with concern for high-grade stenosis of her right common femoral to below-knee popliteal vein bypass.  No intervention was performed as no stenosis was identified.    Today she is complaining of increasing right leg pain.  This is particular bothersome in her right leg when she walks and limits her mobility.      Past Medical History:  Diagnosis Date   Acute encephalopathy    Allergy    Anemia    Anxiety    Arthritis    Asthma    Blood transfusion without reported diagnosis    Bronchitis    Bursitis    Cervical cancer (HCC)    Chronic constipation    Chronic hepatitis C without hepatic coma (HCC) 10/17/2014   11/10/2015 visit with Dr. Efrain. Repeat labs revealed resolution of infection following treatment.  [facility], Maple Coppersmith, [place], FNP-C; last seen 01/2014 Genotype 1b Diagnosed 01/2013   Chronic kidney disease    stage 3   Colon polyps    Family history of adverse reaction to anesthesia    daughter post-op nausea and vomiting   Fibrocystic breast disease    Fibromyalgia    diagnosed years ago per pt   Galactorrhea on right side    GERD (gastroesophageal reflux disease)    Headache    migraines in the past   Hepatitis C    Hyperlipidemia    Hypertension    Insomnia    Neuromuscular disorder (HCC)    Osteoporosis    Respiratory failure (HCC) 03/20/2017   Seizures (HCC)     Past Surgical History:  Procedure Laterality Date   ABDOMINAL AORTOGRAM W/LOWER EXTREMITY Right 08/14/2020   Procedure: ABDOMINAL AORTOGRAM W/LOWER  EXTREMITY;  Surgeon: Gretta Lonni PARAS, MD;  Location: [facility];  Service: Cardiovascular;  Laterality: Right;   ABDOMINAL AORTOGRAM W/LOWER EXTREMITY N/A 05/07/2021   Procedure: ABDOMINAL AORTOGRAM W/LOWER EXTREMITY;  Surgeon: Gretta Lonni PARAS, MD;  Location: [facility];  Service: Cardiovascular;  Laterality: N/A;   ABDOMINAL AORTOGRAM W/LOWER EXTREMITY N/A 07/29/2022   Procedure: ABDOMINAL AORTOGRAM W/LOWER EXTREMITY;  Surgeon: Gretta Lonni PARAS, MD;  Location: [facility];  Service: Cardiovascular;  Laterality: N/A;   ABDOMINAL HYSTERECTOMY  30's   due to cervical cancer   APPENDECTOMY     BIOPSY  01/29/2020   Procedure: BIOPSY;  Surgeon: Wilhelmenia Aloha Raddle., MD;  Location: [facility] ENDOSCOPY;  Service: Gastroenterology;;   DILATION AND CURETTAGE OF UTERUS     pt says years ago when living in [place]   ESOPHAGOGASTRODUODENOSCOPY (EGD) WITH PROPOFOL  N/A 01/29/2020   Procedure: ESOPHAGOGASTRODUODENOSCOPY (EGD) WITH PROPOFOL ;  Surgeon: Wilhelmenia Aloha Raddle., MD;  Location: [facility] ENDOSCOPY;  Service: Gastroenterology;  Laterality: N/A;   FEMORAL-POPLITEAL BYPASS GRAFT Right 09/03/2020   Procedure: BYPASS GRAFT FEMORAL-POPLITEAL ARTERY USING NON-REVERSED GREATER SAPHENOUS VEIN RIGHT;  Surgeon: Gretta Lonni PARAS, MD;  Location: [facility] OR;  Service: Vascular;  Laterality: Right;   PERIPHERAL VASCULAR BALLOON ANGIOPLASTY  05/07/2021   Procedure: PERIPHERAL VASCULAR BALLOON ANGIOPLASTY;  Surgeon: Gretta Lonni PARAS, MD;  Location:  [facility];  Service: Cardiovascular;;  Distal Bypass   POLYPECTOMY     SAVORY DILATION N/A 01/29/2020   Procedure: BETTEJANE DILATION;  Surgeon: Wilhelmenia Aloha Raddle., MD;  Location: [facility] ENDOSCOPY;  Service: Gastroenterology;  Laterality: N/A;    Family History  Problem Relation Age of Onset   Asthma Mother    Colon cancer Sister 73   Stroke Brother 7   Stroke Sister 7   Cancer Sister 37       lung cancer, +TOBACCO   Arthritis  Daughter    Pulmonary embolism Daughter 58       on chronic warfarin   Hypertension Son 84   Esophageal cancer Neg Hx    Rectal cancer Neg Hx    Stomach cancer Neg Hx     SOCIAL HISTORY: Social History   Socioeconomic History   Marital status: Divorced    Spouse name: Not on file   Number of children: 3   Years of education: 14+   Highest education level: Not on file  Occupational History   Occupation: retired    Comment: phlebotomy, EKG tech; taught both  Tobacco Use   Smoking status: Every Day    Current packs/day: 0.50    Average packs/day: 0.5 packs/day for 40.0 years (20.0 ttl pk-yrs)    Types: Cigarettes    Passive exposure: Never   Smokeless tobacco: Never   Tobacco comments:    I'm scared of e-cigarettes, interested in patches, tobacco info given 01/10/15  Vaping Use   Vaping status: Never Used  Substance and Sexual Activity   Alcohol use: Yes    Alcohol/week: 2.0 standard drinks of alcohol    Types: 2 Glasses of wine per week   Drug use: No   Sexual activity: Not Currently  Other Topics Concern   Not on file  Social History Narrative   Raised in [place], [place].   Moved to [place], [place] and then came to [place] in 2013.   Lives alone.   Daughter lives around the corner.    Raised her niece as her own daughter, and she lives nearby.   Son lives in [place] .   Right handed   Social Drivers of Health   Financial Resource Strain: Low Risk  (04/19/2023)   Received from [facility]   Overall Financial Resource Strain (CARDIA)    Difficulty of Paying Living Expenses: Not hard at all  Food Insecurity: No Food Insecurity (04/19/2023)   Received from [facility]   Hunger Vital Sign    Within the past 12 months, you worried that your food would run out before you got the money to buy more.: Never true    Within the past 12 months, the food you bought just didn't last and you didn't have money to get more.: Never true  Transportation Needs: No Transportation  Needs (04/19/2023)   Received from [facility] - Transportation    Lack of Transportation (Medical): No    Lack of Transportation (Non-Medical): No  Physical Activity: Sufficiently Active (04/19/2023)   Received from [facility]   Exercise Vital Sign    On average, how many days per week do you engage in moderate to strenuous exercise (like a brisk walk)?: 7 days    On average, how many minutes do you engage in exercise at this level?: 30 min  Stress: No Stress Concern Present (04/19/2023)   Received from [facility] - Occupational Stress  Questionnaire    Feeling of Stress : Not at all  Social Connections: Socially Integrated (04/19/2023)   Received from [facility]   Social Network    How would you rate your social network (family, work, friends)?: Good participation with social networks  Intimate Partner Violence: Not At Risk (04/16/2022)   Received from [facility]   HITS    Over the last 12 months how often did your partner physically hurt you?: Never    Over the last 12 months how often did your partner insult you or talk down to you?: Never    Over the last 12 months how often did your partner threaten you with physical harm?: Never    Over the last 12 months how often did your partner scream or curse at you?: Never    Allergies  Allergen Reactions   Latex Itching   Molds & Smuts Other (See Comments)    Per allergy test   Pollen Extract Other (See Comments)    Per allergy test   Aspirin  Nausea And Vomiting and Rash    Current Outpatient Medications  Medication Sig Dispense Refill   albuterol  (PROVENTIL  HFA;VENTOLIN  HFA) 108 (90 Base) MCG/ACT inhaler Inhale 2 puffs into the lungs every 6 (six) hours as needed for wheezing. 1 Inhaler 1   atorvastatin  (LIPITOR) 40 MG tablet TAKE 1 TABLET BY MOUTH EVERY DAY (Patient taking differently: Take 40 mg by mouth daily in the afternoon.) 90 tablet 3   busPIRone  (BUSPAR ) 10  MG tablet Take 10 mg by mouth 3 (three) times daily.     Capsaicin (MUSCLE RELIEF EX) Apply 1 application topically daily as needed (muscle cramps).     clopidogrel  (PLAVIX ) 75 MG tablet Take 75 mg by mouth daily.     cyanocobalamin  (VITAMIN B12) 1000 MCG/ML injection INJECT 1000 MCG (1 ML) INTRAMUSCULAR DAILY FOR 1 WEEK, THEN WEEKLY FOR 1 MONTH, THEN MONTHLY FOR A YEAR 3 mL 3   diclofenac  Sodium (VOLTAREN ) 1 % GEL Apply 2 g topically 4 (four) times daily. (Patient taking differently: Apply 2 g topically daily as needed (Pain).) 50 g 1   donepezil  (ARICEPT ) 5 MG tablet Take 1 tablet every morning 90 tablet 3   ergocalciferol  (VITAMIN D2) 1.25 MG (50000 UT) capsule Take 50,000 Units by mouth every Wednesday.      folic acid  (FOLVITE ) 400 MCG tablet Take 400 mcg by mouth daily.     gabapentin  (NEURONTIN ) 300 MG capsule Take 300-600 mg by mouth See admin instructions. Take 300 mg morning and noon and 600 mg at bedtime     hydrochlorothiazide  (HYDRODIURIL ) 25 MG tablet Take 25 mg by mouth every morning.     HYDROcodone -acetaminophen  (NORCO) 10-325 MG tablet Take 1 tablet by mouth every 8 (eight) hours as needed for pain 90 tablet 0   ketoconazole  (NIZORAL ) 2 % cream Apply 1 application topically daily as needed for irritation.     levETIRAcetam  (KEPPRA ) 500 MG tablet Take 1 tablet (500 mg total) by mouth 2 (two) times daily. 180 tablet 3   loratadine  (CLARITIN ) 10 MG tablet Take 10 mg by mouth daily as needed for allergies or rhinitis.     memantine  (NAMENDA ) 10 MG tablet Take 1 tablet every night for 2 weeks, then increase to 1 tablet twice a day and continue 180 tablet 3   pantoprazole  (PROTONIX ) 40 MG tablet Take 40 mg by mouth at bedtime.     QUEtiapine  (SEROQUEL ) 300 MG tablet Take 1 tablet (300 mg total)  by mouth at bedtime. 90 tablet 3   sharps container 1 each by Does not apply route as needed. 1 each 0   SYRINGE-NEEDLE, DISP, 3 ML (B-D 3CC LUER-LOK SYR 25GX1/2) 25G X 1-1/2 3 ML MISC Use with  B 12 injection 50 each 0   tetrahydrozoline-zinc (VISINE-AC) 0.05-0.25 % ophthalmic solution Place 1-2 drops into both eyes 3 (three) times daily as needed (for irritation).      No current facility-administered medications for this visit.    REVIEW OF SYSTEMS:  [X]  denotes positive finding, [ ]  denotes negative finding Cardiac  Comments:  Chest pain or chest pressure:    Shortness of breath upon exertion:    Short of breath when lying flat:    Irregular heart rhythm:        Vascular    Pain in calf, thigh, or hip brought on by ambulation: x Right  Pain in feet at night that wakes you up from your sleep:     Blood clot in your veins:    Leg swelling:         Pulmonary    Oxygen at home:    Productive cough:     Wheezing:         Neurologic    Sudden weakness in arms or legs:     Sudden numbness in arms or legs:     Sudden onset of difficulty speaking or slurred speech:    Temporary loss of vision in one eye:     Problems with dizziness:         Gastrointestinal    Blood in stool:     Vomited blood:         Genitourinary    Burning when urinating:     Blood in urine:        Psychiatric    Major depression:         Hematologic    Bleeding problems:    Problems with blood clotting too easily:        Skin    Rashes or ulcers:        Constitutional    Fever or chills:      PHYSICAL EXAM: Vitals:   12/06/23 1444  BP: 130/75  Pulse: 87  Resp: 20  Temp: 98.2 F (36.8 C)  TempSrc: Temporal  SpO2: 97%  Weight: 178 lb 1.6 oz (80.8 kg)  Height: 5' 5 (1.651 m)    GENERAL: The patient is a well-nourished female, in no acute distress. The vital signs are documented above. CARDIAC: There is a regular rate and rhythm.  VASCULAR:  Right femoral pulse palpable Left femoral pulse weakly palpable No palpable right pedal pulse PULMONARY: No respiratory distress. ABDOMEN: Soft and non-tender. MUSCULOSKELETAL: There are no major deformities or  cyanosis. NEUROLOGIC: No focal weakness or paresthesias are detected. PSYCHIATRIC: The patient has a normal affect.  DATA:   ABIs 12/02/2023 are 0.83 on the right and 0.56 on the left  Duplex suggest over 70% proximal anastomotic stenosis with a velocity of 383 and some inflow disease  Assessment/Plan:  78 y.o. female, with multiple comorbidities as listed below that presents for 6 month follow-up of her PAD.    She had a previous right common femoral to below-knee popliteal bypass with vein for CLI with rest pain on 09/03/2020.  She underwent angiogram on 07/29/2022 with concern for high-grade stenosis of her right common femoral to below-knee popliteal vein bypass.  No intervention was performed as no stenosis  was identified.  Now with increasing right leg discomfort with activity.  She has a number of complaints unclear how much of this is arthritic joint pain but I have recommended angiogram, lower extremity arteriogram with a focus on the right leg given persistent high-grade stenosis in her bypass graft and history now concerning for claudication.  I discussed this being done through transfemoral access in the Cath Lab under moderate sedation at [facility].  We will get scheduled.  Likely balloon angioplasty and/or stent if we identify bypass stenosis.    Lonni DOROTHA Gaskins, MD [facility] Office: [phone]   Lonni JINNY Gaskins

## 2023-12-07 ENCOUNTER — Telehealth: Payer: Self-pay

## 2023-12-07 NOTE — Telephone Encounter (Signed)
 Spoke to daughter, Garen, re: abd aortogram with [facility].  Daughter states that she just had surg and needs time to recover prior to scheduling.  Will call for scheduling.

## 2024-01-06 ENCOUNTER — Other Ambulatory Visit: Payer: Self-pay

## 2024-01-06 DIAGNOSIS — I70221 Atherosclerosis of native arteries of extremities with rest pain, right leg: Secondary | ICD-10-CM

## 2024-02-02 ENCOUNTER — Encounter (HOSPITAL_COMMUNITY)
Admission: RE | Disposition: A | Discharge status: Home / Self Care | Payer: Self-pay | Source: Home / Self Care | Attending: Vascular Surgery

## 2024-02-02 ENCOUNTER — Other Ambulatory Visit: Payer: Self-pay

## 2024-02-02 ENCOUNTER — Ambulatory Visit (HOSPITAL_COMMUNITY)
Admission: RE | Admit: 2024-02-02 | Discharge: 2024-02-02 | Disposition: A | Discharge status: Home / Self Care | Attending: Vascular Surgery | Admitting: Vascular Surgery

## 2024-02-02 DIAGNOSIS — I70221 Atherosclerosis of native arteries of extremities with rest pain, right leg: Secondary | ICD-10-CM

## 2024-02-02 HISTORY — PX: LOWER EXTREMITY INTERVENTION: CATH118252

## 2024-02-02 HISTORY — PX: LOWER EXTREMITY ANGIOGRAPHY: CATH118251

## 2024-02-02 HISTORY — PX: ABDOMINAL AORTOGRAM: CATH118222

## 2024-02-02 LAB — POCT I-STAT, CHEM 8
BUN: 23 mg/dL (ref 8–23)
Calcium, Ion: 1.13 mmol/L — ABNORMAL LOW (ref 1.15–1.40)
Chloride: 103 mmol/L (ref 98–111)
Creatinine, Ser: 0.9 mg/dL (ref 0.44–1.00)
Glucose, Bld: 111 mg/dL — ABNORMAL HIGH (ref 70–99)
HCT: 42 % (ref 36.0–46.0)
Hemoglobin: 14.3 g/dL (ref 12.0–15.0)
Potassium: 3.6 mmol/L (ref 3.5–5.1)
Sodium: 141 mmol/L (ref 135–145)
TCO2: 27 mmol/L (ref 22–32)

## 2024-02-02 LAB — POCT ACTIVATED CLOTTING TIME: Activated Clotting Time: 153 s

## 2024-02-02 SURGERY — ABDOMINAL AORTOGRAM
Anesthesia: LOCAL

## 2024-02-02 MED ORDER — LABETALOL HCL 5 MG/ML IV SOLN
INTRAVENOUS | Status: DC
  Filled 2024-02-02: qty 4

## 2024-02-02 MED ORDER — SODIUM CHLORIDE 0.9 % IV SOLN: INTRAVENOUS | Status: DC

## 2024-02-02 MED ORDER — OXYCODONE HCL 5 MG PO TABS
ORAL_TABLET | ORAL | Status: DC
  Filled 2024-02-02: qty 1

## 2024-02-02 MED ORDER — ACETAMINOPHEN 325 MG PO TABS: 650.0000 mg | ORAL_TABLET | ORAL | Status: DC | PRN

## 2024-02-02 MED ORDER — FENTANYL CITRATE (PF) 100 MCG/2ML IJ SOLN
INTRAMUSCULAR | Status: DC | PRN
  Administered 2024-02-02 (×2): 25 ug via INTRAVENOUS

## 2024-02-02 MED ORDER — CLOPIDOGREL BISULFATE 300 MG PO TABS
ORAL_TABLET | ORAL | Status: DC | PRN
  Administered 2024-02-02: 75 mg via ORAL

## 2024-02-02 MED ORDER — OXYCODONE HCL 5 MG PO TABS
5.0000 mg | ORAL_TABLET | ORAL | Status: DC | PRN
  Administered 2024-02-02: 10 mg via ORAL
  Administered 2024-02-02: 5 mg via ORAL

## 2024-02-02 MED ORDER — LABETALOL HCL 5 MG/ML IV SOLN
10.0000 mg | INTRAVENOUS | Status: DC | PRN
  Administered 2024-02-02 (×3): 10 mg via INTRAVENOUS

## 2024-02-02 MED ORDER — IODIXANOL 320 MG/ML IV SOLN
INTRAVENOUS | Status: DC | PRN
  Administered 2024-02-02: 70 mL via INTRA_ARTERIAL

## 2024-02-02 MED ORDER — HYDRALAZINE HCL 20 MG/ML IJ SOLN
5.0000 mg | INTRAMUSCULAR | Status: AC | PRN
  Administered 2024-02-02 (×2): 5 mg via INTRAVENOUS

## 2024-02-02 MED ORDER — FENTANYL CITRATE (PF) 100 MCG/2ML IJ SOLN
INTRAMUSCULAR | Status: AC
  Filled 2024-02-02: qty 2

## 2024-02-02 MED ORDER — HEPARIN (PORCINE) IN NACL 1000-0.9 UT/500ML-% IV SOLN
INTRAVENOUS | Status: DC | PRN
  Administered 2024-02-02: 1000 mL

## 2024-02-02 MED ORDER — OXYCODONE HCL 5 MG PO TABS
ORAL_TABLET | ORAL | Status: DC
  Filled 2024-02-02: qty 2

## 2024-02-02 MED ORDER — LIDOCAINE HCL (PF) 1 % IJ SOLN
INTRAMUSCULAR | Status: AC
  Filled 2024-02-02: qty 30

## 2024-02-02 MED ORDER — MIDAZOLAM HCL 2 MG/2ML IJ SOLN
INTRAMUSCULAR | Status: AC
  Filled 2024-02-02: qty 2

## 2024-02-02 MED ORDER — LIDOCAINE HCL (PF) 1 % IJ SOLN
INTRAMUSCULAR | Status: DC | PRN
  Administered 2024-02-02: 30 mL via INTRADERMAL

## 2024-02-02 MED ORDER — HEPARIN SODIUM (PORCINE) 1000 UNIT/ML IJ SOLN
INTRAMUSCULAR | Status: DC | PRN
  Administered 2024-02-02: 8000 [IU] via INTRAVENOUS

## 2024-02-02 MED ORDER — HYDRALAZINE HCL 20 MG/ML IJ SOLN
INTRAMUSCULAR | Status: DC
  Filled 2024-02-02: qty 1

## 2024-02-02 MED ORDER — ONDANSETRON HCL 4 MG/2ML IJ SOLN: 4.0000 mg | Freq: Four times a day (QID) | INTRAMUSCULAR | Status: DC | PRN

## 2024-02-02 MED ORDER — SODIUM CHLORIDE 0.9 % IV SOLN: INTRAVENOUS | Status: AC

## 2024-02-02 MED ORDER — CLOPIDOGREL BISULFATE 75 MG PO TABS
ORAL_TABLET | ORAL | Status: AC
  Filled 2024-02-02: qty 1

## 2024-02-02 MED ORDER — MIDAZOLAM HCL (PF) 2 MG/2ML IJ SOLN
INTRAMUSCULAR | Status: DC | PRN
  Administered 2024-02-02: 1 mg via INTRAVENOUS

## 2024-02-02 SURGICAL SUPPLY — 14 items
CATH OMNI FLUSH 5F 65CM (CATHETERS) IMPLANT
COVER DOME SNAP 22 D (MISCELLANEOUS) IMPLANT
DCB IN.PACT 4X80 (BALLOONS) IMPLANT
GLIDEWIRE ADV .035X260CM (WIRE) IMPLANT
KIT ENCORE 26 ADVANTAGE (KITS) IMPLANT
KIT MICROPUNCTURE NIT STIFF (SHEATH) IMPLANT
KIT SYRINGE INJ CVI SPIKEX1 (MISCELLANEOUS) IMPLANT
PACK CARDIAC CATHETERIZATION (CUSTOM PROCEDURE TRAY) IMPLANT
SET ATX-X65L (MISCELLANEOUS) IMPLANT
SHEATH CATAPULT 6FR 45 (SHEATH) IMPLANT
SHEATH PINNACLE 5F 10CM (SHEATH) IMPLANT
SHEATH PINNACLE 6F 10CM (SHEATH) IMPLANT
SHEATH PROBE COVER 6X72 (BAG) IMPLANT
WIRE BENTSON .035X145CM (WIRE) IMPLANT

## 2024-02-02 NOTE — Progress Notes (Signed)
 Discharge instructions reviewed with patient and daughter Orene at the bedside. Denies questions or concerns. PT tolerate PO intake was able to void prior to discharge. PT was on;y able to ambulate int he room d/t baseline condition. PT escorted from the unit via wheel chair to personal vehicle.

## 2024-02-02 NOTE — Op Note (Signed)
 Patient name: Heather Horton MRN: [id number] DOB: 1945-05-06 Sex: female  02/02/2024 Pre-operative Diagnosis: Concern for high-grade stenosis in the proximal right common femoral to below-knee popliteal vein bypass graft Post-operative diagnosis:  Same Surgeon:  Lonni DOROTHA Gaskins, MD Procedure Performed: 1.  Ultrasound-guided access left common femoral artery 2.  Aortogram with catheter selection of aorta 3.  Right lower extremity arteriogram with catheter selection of the femoropopliteal bypass graft 4.  Drug-coated balloon angioplasty of right distal common femoral, proximal anastomosis, proximal common femoral to below-knee popliteal vein graft 5.  Left lower extremity angiogram with runoff from left femoral sheath 6.  33 minutes of monitored moderate conscious sedation time  Indications: Patient is a 78 year old female seen with recent surveillance of her right common femoral to below-knee popliteal bypass for rest pain done on 09/03/2020.  She has had an increasing stenosis in the proximal anastomosis now almost 400.  She presents for right lower extremity injury with possible invention after risk-benefit is discussed.  Findings:   Patient has a small infrarenal aorta and iliacs but these are patent without flow-limiting stenosis.  The left renal was visualized and is widely patent.  On the right she has patent common femoral and profunda with sluggish flow in the right common femoral to below-knee popliteal vein graft.  The common femoral artery is small compared to size of hood of bypass graft.  Distally the anastomosis is widely patent to the below-knee popliteal artery with runoff in the anterior tibial and peroneal.  The distal right common femoral artery including the anastomosis of the proximal bypass was treated with a 4 mm x 80 mm drug-coated Inpact for 3 minutes.  Much more brisk flow down the bypass  Left lower extremity runoff did show high-grade common femoral stenosis  with only profunda runoff.  She does reconstitute an above-knee popliteal artery with two-vessel runoff in the AT peroneal that is sluggish.   Procedure:  The patient was identified in the holding area and taken to room 8.  The patient was then placed supine on the table and prepped and draped in the usual sterile fashion.  A time out was called.  Patient received Versed  and fentanyl  for conscious moderate sedation.  Vital signs were monitored including heart rate, respiratory rate, oxygenation and blood pressure.  I was present for all moderate sedation.  Ultrasound was used to evaluate the left common femoral artery.  It was patent .  A digital ultrasound image was acquired.  A micropuncture needle was used to access the left common femoral artery under ultrasound guidance.  An 018 wire was advanced without resistance and a micropuncture sheath was placed.  The 018 wire was removed and a benson wire was placed.  The micropuncture sheath was exchanged for a 5 french sheath.  An omniflush catheter was advanced over the wire to the level of L-1.  An abdominal angiogram was obtained.  Next, using the omniflush catheter and a benson wire, the aortic bifurcation was crossed and the catheter was placed into theright external iliac artery and right runoff was obtained.  Pertinent findings are noted above.  Flow down the right leg bypass was sluggish and we elected to intervene on the proximal anastomosis where there was a high-grade velocity noted on duplex suggesting >70% stenosis.  I used a Glidewire advantage to get into the right leg bypass and upsized to a 6 French catapult sheath in the left groin over the aortic bifurcation.  Patient was given 100 units/kg  IV heparin .  I then selected a 4 mm x 80 mm drug-coated Inpact that was inflated nominal pressure in the distal common femoral artery across the proximal anastomosis into the right leg bypass graft for 3 minutes.  Much more brisk flow down the bypass.  Wires  and catheters were removed.  I did put a short 6 French sheath in the left groin.  Left leg runoff was obtained from the sheath in the left groin with pertinent findings noted above.  Taken to holding for sheath removal.  Plan: Plavix  statin.  Will arrange follow-up in 1 month with right leg arterial duplex and ABIs.  Lonni DOROTHA Gaskins, MD [facility] [place] Office: [phone]

## 2024-02-02 NOTE — Progress Notes (Signed)
 Site Area: left femoral   Site prior to removal: level 0, clean/dry/intact  Pressure applied for: 30 minutes   Bedrest beginning at: 1245  Manual: yes  Patient status during pull: stable   Post pull groin site: level 0, clean/dry/intact  Post pull instructions given: yes  Post pull pulses present: doppler DP and PT on left foot   Dressing applied: gauze and tegaderm   Comments:

## 2024-02-02 NOTE — Discharge Instructions (Signed)
 Femoral Site Care This sheet gives you information about how to care for yourself after your procedure. Your health care provider may also give you more specific instructions. If you have problems or questions, contact your health care provider. What can I expect after the procedure?  After the procedure, it is common to have: Bruising that usually fades within 1-2 weeks. Tenderness at the site. Follow these instructions at home: Wound care Follow instructions from your health care provider about how to take care of your insertion site. Make sure you: Wash your hands with soap and water before you change your bandage (dressing). If soap and water are not available, use hand sanitizer. Remove your dressing as told by your health care provider. In 24 hours Do not take baths, swim, or use a hot tub until your health care provider approves. You may shower 24-48 hours after the procedure or as told by your health care provider. Gently wash the site with plain soap and water. Pat the area dry with a clean towel. Do not rub the site. This may cause bleeding. Do not apply powder or lotion to the site. Keep the site clean and dry. Check your femoral site every day for signs of infection. Check for: Redness, swelling, or pain. Fluid or blood. Warmth. Pus or a bad smell. Activity For the first 2-3 days after your procedure, or as long as directed: Avoid climbing stairs as much as possible. Do not squat. Do not lift anything that is heavier than 10 lb (4.5 kg), or the limit that you are told, until your health care provider says that it is safe. For 5 days Rest as directed. Avoid sitting for a long time without moving. Get up to take short walks every 1-2 hours. Do not drive for 24 hours if you were given a medicine to help you relax (sedative). General instructions Take over-the-counter and prescription medicines only as told by your health care provider. Keep all follow-up visits as told by  your health care provider. This is important. Contact a health care provider if you have: A fever or chills. You have redness, swelling, or pain around your insertion site. Get help right away if: The catheter insertion area swells very fast. You pass out. You suddenly start to sweat or your skin gets clammy. The catheter insertion area is bleeding, and the bleeding does not stop when you hold steady pressure on the area. The area near or just beyond the catheter insertion site becomes pale, cool, tingly, or numb. These symptoms may represent a serious problem that is an emergency. Do not wait to see if the symptoms will go away. Get medical help right away. Call your local emergency services (911 in the [place] Do not drive yourself to the hospital. Summary After the procedure, it is common to have bruising that usually fades within 1-2 weeks. Check your femoral site every day for signs of infection. Do not lift anything that is heavier than 10 lb (4.5 kg), or the limit that you are told, until your health care provider says that it is safe. This information is not intended to replace advice given to you by your health care provider. Make sure you discuss any questions you have with your health care provider. Document Revised: 02/28/2017 Document Reviewed: 02/28/2017 Elsevier Patient Education  2020 [employer].

## 2024-02-02 NOTE — H&P (Signed)
 History and Physical Interval Note:  02/02/2024 8:09 AM  Heather Horton  has presented today for surgery, with the diagnosis of ischemia righ lower.  The various methods of treatment have been discussed with the patient and family. After consideration of risks, benefits and other options for treatment, the patient has consented to  Procedure(s): ABDOMINAL AORTOGRAM (N/A) Lower Extremity Angiography (N/A) LOWER EXTREMITY INTERVENTION (N/A) as a surgical intervention.  The patient's history has been reviewed, patient examined, no change in status, stable for surgery.  I have reviewed the patient's chart and labs.  Questions were answered to the patient's satisfaction.     Heather Horton     Patient name: Heather Horton MRN: [id number]        DOB: [birth year]            Sex: female   REASON FOR CONSULT: 63-month follow-up PAD   HPI: Heather Horton is a 78 y.o. female, with multiple comorbidities as listed below that presents for 6 month follow-up of her PAD.     She had a previous right common femoral to below-knee popliteal bypass with vein for CLI with rest pain on 09/03/2020.  She underwent angiogram on 07/29/2022 with concern for high-grade stenosis of her right common femoral to below-knee popliteal vein bypass.  No intervention was performed as no stenosis was identified.     Today she is complaining of increasing right leg pain.  This is particular bothersome in her right leg when she walks and limits her mobility.             Past Medical History:  Diagnosis Date   Acute encephalopathy     Allergy     Anemia     Anxiety     Arthritis     Asthma     Blood transfusion without reported diagnosis     Bronchitis     Bursitis     Cervical cancer (HCC)     Chronic constipation     Chronic hepatitis C without hepatic coma (HCC) 10/17/2014    11/10/2015 visit with Dr. Efrain. Repeat labs revealed resolution of infection following treatment.  [facility],  Maple Coppersmith, [place], FNP-C; last seen 01/2014 Genotype 1b Diagnosed 01/2013   Chronic kidney disease      stage 3   Colon polyps     Family history of adverse reaction to anesthesia      daughter post-op nausea and vomiting   Fibrocystic breast disease     Fibromyalgia      diagnosed years ago per pt   Galactorrhea on right side     GERD (gastroesophageal reflux disease)     Headache      migraines in the past   Hepatitis C     Hyperlipidemia     Hypertension     Insomnia     Neuromuscular disorder (HCC)     Osteoporosis     Respiratory failure (HCC) 03/20/2017   Seizures (HCC)                 Past Surgical History:  Procedure Laterality Date   ABDOMINAL AORTOGRAM W/LOWER EXTREMITY Right 08/14/2020    Procedure: ABDOMINAL AORTOGRAM W/LOWER EXTREMITY;  Surgeon: Horton Heather JINNY, MD;  Location: [facility];  Service: Cardiovascular;  Laterality: Right;   ABDOMINAL AORTOGRAM W/LOWER EXTREMITY N/A 05/07/2021    Procedure: ABDOMINAL AORTOGRAM W/LOWER EXTREMITY;  Surgeon: Horton Heather JINNY, MD;  Location: [facility];  Service: Cardiovascular;  Laterality: N/A;   ABDOMINAL AORTOGRAM W/LOWER EXTREMITY N/A 07/29/2022    Procedure: ABDOMINAL AORTOGRAM W/LOWER EXTREMITY;  Surgeon: Heather Heather PARAS, MD;  Location: [facility];  Service: Cardiovascular;  Laterality: N/A;   ABDOMINAL HYSTERECTOMY   30's    due to cervical cancer   APPENDECTOMY       BIOPSY   01/29/2020    Procedure: BIOPSY;  Surgeon: Wilhelmenia Aloha Raddle., MD;  Location: [facility];  Service: Gastroenterology;;   DILATION AND CURETTAGE OF UTERUS        pt says years ago when living in [place]   ESOPHAGOGASTRODUODENOSCOPY (EGD) WITH PROPOFOL  N/A 01/29/2020    Procedure: ESOPHAGOGASTRODUODENOSCOPY (EGD) WITH PROPOFOL ;  Surgeon: Wilhelmenia Aloha Raddle., MD;  Location: [facility] ENDOSCOPY;  Service: Gastroenterology;  Laterality: N/A;   FEMORAL-POPLITEAL BYPASS GRAFT Right 09/03/2020    Procedure:  BYPASS GRAFT FEMORAL-POPLITEAL ARTERY USING NON-REVERSED GREATER SAPHENOUS VEIN RIGHT;  Surgeon: Heather Heather PARAS, MD;  Location: [facility] OR;  Service: Vascular;  Laterality: Right;   PERIPHERAL VASCULAR BALLOON ANGIOPLASTY   05/07/2021    Procedure: PERIPHERAL VASCULAR BALLOON ANGIOPLASTY;  Surgeon: Heather Heather PARAS, MD;  Location: [facility];  Service: Cardiovascular;;  Distal Bypass   POLYPECTOMY       SAVORY DILATION N/A 01/29/2020    Procedure: BETTEJANE DILATION;  Surgeon: Wilhelmenia Aloha Raddle., MD;  Location: [facility] ENDOSCOPY;  Service: Gastroenterology;  Laterality: N/A;               Family History  Problem Relation Age of Onset   Asthma Mother     Colon cancer Sister 65   Stroke Brother 35   Stroke Sister 36   Cancer Sister 68        lung cancer, +TOBACCO   Arthritis Daughter     Pulmonary embolism Daughter 50        on chronic warfarin   Hypertension Son 81   Esophageal cancer Neg Hx     Rectal cancer Neg Hx     Stomach cancer Neg Hx            SOCIAL HISTORY: Social History         Socioeconomic History   Marital status: Divorced      Spouse name: Not on file   Number of children: 3   Years of education: 14+   Highest education level: Not on file  Occupational History   Occupation: retired      Comment: phlebotomy, EKG tech; taught both  Tobacco Use   Smoking status: Every Day      Current packs/day: 0.50      Average packs/day: 0.5 packs/day for 40.0 years (20.0 ttl pk-yrs)      Types: Cigarettes      Passive exposure: Never   Smokeless tobacco: Never   Tobacco comments:      I'm scared of e-cigarettes, interested in patches, tobacco info given 01/10/15  Vaping Use   Vaping status: Never Used  Substance and Sexual Activity   Alcohol use: Yes      Alcohol/week: 2.0 standard drinks of alcohol      Types: 2 Glasses of wine per week   Drug use: No   Sexual activity: Not Currently  Other Topics Concern   Not on file  Social History Narrative     Raised in [place], [place].    Moved to [place], [place] and then came to [place] in 2013.    Lives alone.  Daughter lives around the corner.     Raised her niece as her own daughter, and she lives nearby.    Son lives in [place] .    Right handed    Social Drivers of Health        Financial Resource Strain: Low Risk  (04/19/2023)    Received from [facility]    Overall Financial Resource Strain (CARDIA)     Difficulty of Paying Living Expenses: Not hard at all  Food Insecurity: No Food Insecurity (04/19/2023)    Received from [facility]    Hunger Vital Sign     Within the past 12 months, you worried that your food would run out before you got the money to buy more.: Never true     Within the past 12 months, the food you bought just didn't last and you didn't have money to get more.: Never true  Transportation Needs: No Transportation Needs (04/19/2023)    Received from [facility] - Transportation     Lack of Transportation (Medical): No     Lack of Transportation (Non-Medical): No  Physical Activity: Sufficiently Active (04/19/2023)    Received from [facility]    Exercise Vital Sign     On average, how many days per week do you engage in moderate to strenuous exercise (like a brisk walk)?: 7 days     On average, how many minutes do you engage in exercise at this level?: 30 min  Stress: No Stress Concern Present (04/19/2023)    Received from [facility] - Occupational Stress Questionnaire     Feeling of Stress : Not at all  Social Connections: Socially Integrated (04/19/2023)    Received from [facility]    Social Network     How would you rate your social network (family, work, friends)?: Good participation with social networks  Intimate Partner Violence: Not At Risk (04/16/2022)    Received from [facility]    HITS     Over the last 12 months how often did your partner physically hurt you?: Never     Over  the last 12 months how often did your partner insult you or talk down to you?: Never     Over the last 12 months how often did your partner threaten you with physical harm?: Never     Over the last 12 months how often did your partner scream or curse at you?: Never      Allergies       Allergies  Allergen Reactions   Latex Itching   Molds & Smuts Other (See Comments)      Per allergy test   Pollen Extract Other (See Comments)      Per allergy test   Aspirin  Nausea And Vomiting and Rash              Current Outpatient Medications  Medication Sig Dispense Refill   albuterol  (PROVENTIL  HFA;VENTOLIN  HFA) 108 (90 Base) MCG/ACT inhaler Inhale 2 puffs into the lungs every 6 (six) hours as needed for wheezing. 1 Inhaler 1   atorvastatin  (LIPITOR) 40 MG tablet TAKE 1 TABLET BY MOUTH EVERY DAY (Patient taking differently: Take 40 mg by mouth daily in the afternoon.) 90 tablet 3   busPIRone  (BUSPAR ) 10 MG tablet Take 10 mg by mouth 3 (three) times daily.       Capsaicin (MUSCLE RELIEF EX) Apply 1 application topically daily as needed (  muscle cramps).       clopidogrel  (PLAVIX ) 75 MG tablet Take 75 mg by mouth daily.       cyanocobalamin  (VITAMIN B12) 1000 MCG/ML injection INJECT 1000 MCG (1 ML) INTRAMUSCULAR DAILY FOR 1 WEEK, THEN WEEKLY FOR 1 MONTH, THEN MONTHLY FOR A YEAR 3 mL 3   diclofenac  Sodium (VOLTAREN ) 1 % GEL Apply 2 g topically 4 (four) times daily. (Patient taking differently: Apply 2 g topically daily as needed (Pain).) 50 g 1   donepezil  (ARICEPT ) 5 MG tablet Take 1 tablet every morning 90 tablet 3   ergocalciferol  (VITAMIN D2) 1.25 MG (50000 UT) capsule Take 50,000 Units by mouth every Wednesday.        folic acid  (FOLVITE ) 400 MCG tablet Take 400 mcg by mouth daily.       gabapentin  (NEURONTIN ) 300 MG capsule Take 300-600 mg by mouth See admin instructions. Take 300 mg morning and noon and 600 mg at bedtime       hydrochlorothiazide  (HYDRODIURIL ) 25 MG tablet Take 25 mg by mouth  every morning.       HYDROcodone -acetaminophen  (NORCO) 10-325 MG tablet Take 1 tablet by mouth every 8 (eight) hours as needed for pain 90 tablet 0   ketoconazole  (NIZORAL ) 2 % cream Apply 1 application topically daily as needed for irritation.       levETIRAcetam  (KEPPRA ) 500 MG tablet Take 1 tablet (500 mg total) by mouth 2 (two) times daily. 180 tablet 3   loratadine  (CLARITIN ) 10 MG tablet Take 10 mg by mouth daily as needed for allergies or rhinitis.       memantine  (NAMENDA ) 10 MG tablet Take 1 tablet every night for 2 weeks, then increase to 1 tablet twice a day and continue 180 tablet 3   pantoprazole  (PROTONIX ) 40 MG tablet Take 40 mg by mouth at bedtime.       QUEtiapine  (SEROQUEL ) 300 MG tablet Take 1 tablet (300 mg total) by mouth at bedtime. 90 tablet 3   sharps container 1 each by Does not apply route as needed. 1 each 0   SYRINGE-NEEDLE, DISP, 3 ML (B-D 3CC LUER-LOK SYR 25GX1/2) 25G X 1-1/2 3 ML MISC Use with B 12 injection 50 each 0   tetrahydrozoline-zinc (VISINE-AC) 0.05-0.25 % ophthalmic solution Place 1-2 drops into both eyes 3 (three) times daily as needed (for irritation).           No current facility-administered medications for this visit.        REVIEW OF SYSTEMS:  [X]  denotes positive finding, [ ]  denotes negative finding Cardiac   Comments:  Chest pain or chest pressure:      Shortness of breath upon exertion:      Short of breath when lying flat:      Irregular heart rhythm:             Vascular      Pain in calf, thigh, or hip brought on by ambulation: x Right  Pain in feet at night that wakes you up from your sleep:       Blood clot in your veins:      Leg swelling:              Pulmonary      Oxygen at home:      Productive cough:       Wheezing:              Neurologic      Sudden weakness in  arms or legs:       Sudden numbness in arms or legs:       Sudden onset of difficulty speaking or slurred speech:      Temporary loss of vision in one  eye:       Problems with dizziness:              Gastrointestinal      Blood in stool:       Vomited blood:              Genitourinary      Burning when urinating:       Blood in urine:             Psychiatric      Major depression:              Hematologic      Bleeding problems:      Problems with blood clotting too easily:             Skin      Rashes or ulcers:             Constitutional      Fever or chills:          PHYSICAL EXAM:    Vitals:    12/06/23 1444  BP: 130/75  Pulse: 87  Resp: 20  Temp: 98.2 F (36.8 C)  TempSrc: Temporal  SpO2: 97%  Weight: 178 lb 1.6 oz (80.8 kg)  Height: 5' 5 (1.651 m)      GENERAL: The patient is a well-nourished female, in no acute distress. The vital signs are documented above. CARDIAC: There is a regular rate and rhythm.  VASCULAR:  Right femoral pulse palpable Left femoral pulse weakly palpable No palpable right pedal pulse PULMONARY: No respiratory distress. ABDOMEN: Soft and non-tender. MUSCULOSKELETAL: There are no major deformities or cyanosis. NEUROLOGIC: No focal weakness or paresthesias are detected. PSYCHIATRIC: The patient has a normal affect.   DATA:    ABIs 12/02/2023 are 0.83 on the right and 0.56 on the left   Duplex suggest over 70% proximal anastomotic stenosis with a velocity of 383 and some inflow disease   Assessment/Plan:   78 y.o. female, with multiple comorbidities as listed below that presents for 6 month follow-up of her PAD.     She had a previous right common femoral to below-knee popliteal bypass with vein for CLI with rest pain on 09/03/2020.  She underwent angiogram on 07/29/2022 with concern for high-grade stenosis of her right common femoral to below-knee popliteal vein bypass.  No intervention was performed as no stenosis was identified.   Now with increasing right leg discomfort with activity.  She has a number of complaints unclear how much of this is arthritic joint pain but I have  recommended angiogram, lower extremity arteriogram with a focus on the right leg given persistent high-grade stenosis in her bypass graft and history now concerning for claudication.  I discussed this being done through transfemoral access in the Cath Lab under moderate sedation at [facility].  We will get scheduled.  Likely balloon angioplasty and/or stent if we identify bypass stenosis.       Heather DOROTHA Gaskins, MD [facility] Office: [phone]     Heather Horton

## 2024-02-03 ENCOUNTER — Encounter (HOSPITAL_COMMUNITY): Payer: Self-pay | Admitting: Vascular Surgery

## 2024-02-06 ENCOUNTER — Other Ambulatory Visit: Payer: Self-pay

## 2024-02-06 DIAGNOSIS — I739 Peripheral vascular disease, unspecified: Secondary | ICD-10-CM

## 2024-02-14 ENCOUNTER — Ambulatory Visit: Admitting: Neurology

## 2024-02-14 ENCOUNTER — Encounter: Payer: Self-pay | Admitting: Neurology

## 2024-02-14 VITALS — BP 138/82 | HR 91 | Ht 65.0 in | Wt 180.8 lb

## 2024-02-14 DIAGNOSIS — G40009 Localization-related (focal) (partial) idiopathic epilepsy and epileptic syndromes with seizures of localized onset, not intractable, without status epilepticus: Secondary | ICD-10-CM

## 2024-02-14 DIAGNOSIS — F0390 Unspecified dementia without behavioral disturbance: Secondary | ICD-10-CM

## 2024-02-14 MED ORDER — MEMANTINE HCL 10 MG PO TABS: ORAL_TABLET | ORAL | 3 refills | Status: AC

## 2024-02-14 MED ORDER — LEVETIRACETAM 500 MG PO TABS: 500.0000 mg | ORAL_TABLET | Freq: Two times a day (BID) | ORAL | 3 refills | Status: AC

## 2024-02-14 MED ORDER — DONEPEZIL HCL 5 MG PO TABS: ORAL_TABLET | ORAL | 3 refills | Status: AC

## 2024-02-14 NOTE — Patient Instructions (Signed)
 It's good to see you. Continue all your medications. Follow-up in 6 months, call for any changes.    FALL PRECAUTIONS: Be cautious when walking. Scan the area for obstacles that may increase the risk of trips and falls. When getting up in the mornings, sit up at the edge of the bed for a few minutes before getting out of bed. Consider elevating the bed at the head end to avoid drop of blood pressure when getting up. Walk always in a well-lit room (use night lights in the walls). Avoid area rugs or power cords from appliances in the middle of the walkways. Use a walker or a cane if necessary and consider physical therapy for balance exercise. Get your eyesight checked regularly.   HOME SAFETY: Consider the safety of the kitchen when operating appliances like stoves, microwave oven, and blender. Consider having supervision and share cooking responsibilities until no longer able to participate in those. Accidents with firearms and other hazards in the house should be identified and addressed as well.  ABILITY TO BE LEFT ALONE: If patient is unable to contact 911 operator, consider using LifeLine, or when the need is there, arrange for someone to stay with patients. Smoking is a fire hazard, consider supervision or cessation. Risk of wandering should be assessed by caregiver and if detected at any point, supervision and safe proof recommendations should be instituted.   RECOMMENDATIONS FOR ALL PATIENTS WITH MEMORY PROBLEMS: 1. Continue to exercise (Recommend 30 minutes of walking everyday, or 3 hours every week) 2. Increase social interactions - continue going to [place] and enjoy social gatherings with friends and family 3. Eat healthy, avoid fried foods and eat more fruits and vegetables 4. Maintain adequate blood pressure, blood sugar, and blood cholesterol level. Reducing the risk of stroke and cardiovascular disease also helps promoting better memory. 5. Avoid stressful situations. Live a simple life  and avoid aggravations. Organize your time and prepare for the next day in anticipation. 6. Sleep well, avoid any interruptions of sleep and avoid any distractions in the bedroom that may interfere with adequate sleep quality 7. Avoid sugar, avoid sweets as there is a strong link between excessive sugar intake, diabetes, and cognitive impairment The Mediterranean diet has been shown to help patients reduce the risk of progressive memory disorders and reduces cardiovascular risk. This includes eating fish, eat fruits and green leafy vegetables, nuts like almonds and hazelnuts, walnuts, and also use olive oil. Avoid fast foods and fried foods as much as possible. Avoid sweets and sugar as sugar use has been linked to worsening of memory function.  There is always a concern of gradual progression of memory problems. If this is the case, then we may need to adjust level of care according to patient needs. Support, both to the patient and caregiver, should then be put into place.

## 2024-02-14 NOTE — Progress Notes (Unsigned)
 NEUROLOGY FOLLOW UP OFFICE NOTE  Heather Horton [id number] 11/11/1945  HISTORY OF PRESENT ILLNESS: I had the pleasure of seeing Heather Horton in follow-up in the neurology clinic on 02/14/2024.  The patient was last seen 7 months ago for seizures and memory loss. She is again accompanied by her daughter Garen who helps supplement the history today.  Records and images were personally reviewed where available.  They were reporting worsening memory on last visit, MMSE 25/30 in 06/2023. Memantine  10mg  BID was added to Donepezil  5mg  daily  B12 1360 06/2023 Memory is better [facility] thanksgiving coming out of bathroom reaching for walker, bruised arm, back, no loc Left ankle still swollen, very sore HAs off an on, right across the left eye, sometimes the right; vision okay, has not had vision checked, no n/v Pain everyday, joint pains, knees, to toes, toes so stiff; arthritis and neuropathy Sleeping better since put on the seroquel  Napping during day Mood okay, sometimes irritable when in so much pain, no parnaoia or hhalluc   I had the pleasure of seeing Heather Horton in follow-up in the neurology clinic on 07/05/2023.  The patient was last seen a year ago for seizures and memory loss. She is again accompanied by her daughter Garen who helps supplement the history today.  Records and images were personally reviewed where available.  MMSE 22/30 in 05/2022. She is on Donepezil  5mg  daily without side effects. Her B12 level was low in 05/2022 (183), TSH normal. She was started on B12 injection, last dose was a month ago. Her daughter reports memory is not great, it has progressed a little bit but not much change in the past year. Her daughter manages medications, finances, meals. She does not drive. No seizures since 2019 on Levetiracetam  500mg  BID, no side effects.   She has had a headache for the past few weeks, she reports one the other day and today with pain across the eyes. She had vomiting the other  night for no apparent reason, but she had not eaten saying she was not hungry, then vomited. Tylenol  does not help the headaches. She has neuropathy and takes Gabapentin . No hallucinations or paranoia. She dreams a lot and calls out at night.She gets frustrated when she cannot recall what she wanted to say. She lays around a lot, with pain and swelling in her knees and legs. No falls.    History on Initial Assessment 06/28/2017: This is a 78 year old right-handed woman with a history of hypertension, hyperlipidemia, hepatitis C, cervical cancer, presenting for evaluation of possible seizure. She was admitted to [facility] last 03/20/2017 for altered mental status. She lives alone and recalls getting ready for bed feeling fine, then waking up in the hospital. Her daughter visits her daily except on weekends, she had seen her Friday and spoke to her on the phone the day prior. Her daughter came Sunday noon and found her naked on the floor. She had fallen into a table in the den and daughter found her moaning with eyes rolling back, her right leg and arm were flinching. They found her bed clothes folded on top of the bed. No tongue bite or incontinence. Her daughter feels she was down for a long time because her lips were so dry. She was brought to [facility] where CBC showed WBC 10.9, MCV 101.5, CMP showed mildly elevated creatinine 1.04, CK level was elevated at 1528. UDS was positive for opioids. She had an MRI brain with and without contrast which I  personally reviewed, no acute changes. EEG showed moderate diffuse background slowing, no epileptiform discharges. It was noted that symptoms were most likely resolving toxic metabolic encephalopathy in the setting of opiate overuse, however since unclear if seizure-related, she was continued on Keppra  500mg  BID.   Since hospital discharge, her daughter reports she is back to baseline. Her daughter now administers medications. Her daughter reports that prior to hospitalization,  she was taking Tramadol , Tizanidine, and Trazodone . There is note of hydrocodone , however her daughter did not find any hydrocodone  bottle in the house. She states she has not taken it that day. She is now on Tramadol  TID which does not help with pain from fibromyalgia and osteoarthritis. She is scheduled to see Pain Management. Her daughter denies any staring/unresponsive episodes, she denies any other gaps in time, olfactory/gustatory hallucinations, myoclonic jerks. She has tingling in her left arm and both feet L>R with associated pain. She has swelling in both knees. She does not drive. She has a history of spinal meningitis at age 71. Otherwise she had a normal birth and early development.  There is no history of febrile convulsions, significant traumatic brain injury, neurosurgical procedures, or family history of seizures.  Diagnostic Data:  MRI brain in 03/2017 was unremarkable MRI brain in 12/2019 was unremarkable  EEG in 03/2017 showed moderate diffuse slowing 24-hour EEG in June 2019 which was abnormal, showing occasional left mid-temporal epileptiform discharges during sleep, typical events were not captured.   PAST MEDICAL HISTORY: Past Medical History:  Diagnosis Date   Acute encephalopathy    Allergy    Anemia    Anxiety    Arthritis    Asthma    Blood transfusion without reported diagnosis    Bronchitis    Bursitis    Cervical cancer (HCC)    Chronic constipation    Chronic hepatitis C without hepatic coma (HCC) 10/17/2014   11/10/2015 visit with Dr. Efrain. Repeat labs revealed resolution of infection following treatment.  [employer] [facility], Maple Coppersmith, [place], FNP-C; last seen 01/2014 Genotype 1b Diagnosed 01/2013   Chronic kidney disease    stage 3   Colon polyps    Family history of adverse reaction to anesthesia    daughter post-op nausea and vomiting   Fibrocystic breast disease    Fibromyalgia    diagnosed years ago per pt   Galactorrhea  on right side    GERD (gastroesophageal reflux disease)    Headache    migraines in the past   Hepatitis C    Hyperlipidemia    Hypertension    Insomnia    Neuromuscular disorder (HCC)    Osteoporosis    Respiratory failure (HCC) 03/20/2017   Seizures (HCC)     MEDICATIONS: Medications Ordered Prior to Encounter[1]  ALLERGIES: Allergies[2]  FAMILY HISTORY: Family History  Problem Relation Age of Onset   Asthma Mother    Colon cancer Sister 50   Stroke Brother 44   Stroke Sister 69   Cancer Sister 70       lung cancer, +TOBACCO   Arthritis Daughter    Pulmonary embolism Daughter 39       on chronic warfarin   Hypertension Son 68   Esophageal cancer Neg Hx    Rectal cancer Neg Hx    Stomach cancer Neg Hx     SOCIAL HISTORY: Social History   Socioeconomic History   Marital status: Divorced    Spouse name: Not on file   Number of  children: 3   Years of education: 14+   Highest education level: Not on file  Occupational History   Occupation: retired    Comment: phlebotomy, EKG tech; taught both  Tobacco Use   Smoking status: Every Day    Current packs/day: 0.50    Average packs/day: 0.5 packs/day for 40.0 years (20.0 ttl pk-yrs)    Types: Cigarettes    Passive exposure: Never   Smokeless tobacco: Never   Tobacco comments:    I'm scared of e-cigarettes, interested in patches, tobacco info given 01/10/15  Vaping Use   Vaping status: Never Used  Substance and Sexual Activity   Alcohol use: Yes    Alcohol/week: 2.0 standard drinks of alcohol    Types: 2 Glasses of wine per week   Drug use: No   Sexual activity: Not Currently  Other Topics Concern   Not on file  Social History Narrative   Raised in [place], [place].   Moved to [place], [place] and then came to [place] in 2013.   Lives alone.   Daughter lives around the corner.    Raised her niece as her own daughter, and she lives nearby.   Son lives in [place] .   Right handed   Social Drivers of  Health   Tobacco Use: High Risk (02/14/2024)   Patient History    Smoking Tobacco Use: Every Day    Smokeless Tobacco Use: Never    Passive Exposure: Never  Financial Resource Strain: Low Risk (04/19/2023)   Received from [facility]   Overall Financial Resource Strain (CARDIA)    Difficulty of Paying Living Expenses: Not hard at all  Food Insecurity: No Food Insecurity (04/19/2023)   Received from [facility]   Epic    Within the past 12 months, you worried that your food would run out before you got the money to buy more.: Never true    Within the past 12 months, the food you bought just didn't last and you didn't have money to get more.: Never true  Transportation Needs: No Transportation Needs (04/19/2023)   Received from [facility] - Transportation    Lack of Transportation (Medical): No    Lack of Transportation (Non-Medical): No  Physical Activity: Sufficiently Active (04/19/2023)   Received from [facility]   Exercise Vital Sign    On average, how many days per week do you engage in moderate to strenuous exercise (like a brisk walk)?: 7 days    On average, how many minutes do you engage in exercise at this level?: 30 min  Stress: No Stress Concern Present (04/19/2023)   Received from [facility] - Occupational Stress Questionnaire    Feeling of Stress : Not at all  Social Connections: Socially Integrated (04/19/2023)   Received from [facility]   Social Network    How would you rate your social network (family, work, friends)?: Good participation with social networks  Intimate Partner Violence: Not At Risk (04/16/2022)   Received from [facility]   HITS    Over the last 12 months how often did your partner physically hurt you?: Never    Over the last 12 months how often did your partner insult you or talk down to you?: Never    Over the last 12 months how often did your partner threaten you with physical  harm?: Never    Over the last 12 months how often did your partner scream or  curse at you?: Never  Depression (PHQ2-9): Not on file  Alcohol Screen: Not on file  Housing: Low Risk (04/19/2023)   Received from [facility]    In the last 12 months, was there a time when you were not able to pay the mortgage or rent on time?: No    In the past 12 months, how many times have you moved where you were living?: 0    At any time in the past 12 months, were you homeless or living in a shelter (including now)?: No  Utilities: Not At Risk (04/19/2023)   Received from [facility] Utilities    Threatened with loss of utilities: No  Health Literacy: Not on file     PHYSICAL EXAM: Vitals:   02/14/24 1415  BP: 138/82  Pulse: 91  SpO2: 96%   General: No acute distress Head:  Normocephalic/atraumatic Skin/Extremities: No rash, no edema Neurological Exam: alert and oriented to person, place, and time. No aphasia or dysarthria. Fund of knowledge is appropriate.  Recent and remote memory are intact.  Attention and concentration are normal.   Cranial nerves: Pupils equal, round. Extraocular movements intact with no nystagmus. Visual fields full.  No facial asymmetry.  Motor: Bulk and tone normal, muscle strength 5/5 throughout with no pronator drift.   Finger to nose testing intact.  Gait narrow-based and steady, able to tandem walk adequately.  Romberg negative.   IMPRESSION: This is a 78yo RH woman with a history of f hypertension, hyperlipidemia, hepatitis C, cervical cancer, with left temporal lobe epilepsy diagnosed after an episode of altered mental status in 2019. MRI brain at that time normal. Her 24-hour EEG was abnormal with occasional left mid-temporal epileptiform discharges. She has been seizure-free since 2019, continue Levetiracetam  500mg  BID. Her daughter reports slight decline in the past year, she has been having increased frustration. MMSE today 25/30 (22/30 in 05/2022).  Continue Donepezil  5mg , take in the morning and monitor if this helps with vivid dreams. We discussed adding on Memantine  10mg : take 1 table daily for 2 weeks, then increase to 1 tablet BID, side effects discussed. This ma help with headaches as well. We discussed minimizing headache rescue medication to 2-3 a week to avoid rebound headaches. Continue close supervision. Follow-up in 6 months, call for any changes.     Thank you for allowing me to participate in *** care.  Please do not hesitate to call for any questions or concerns.  The duration of this appointment visit was *** minutes of face-to-face time with the patient.  Greater than 50% of this time was spent in counseling, explanation of diagnosis, planning of further management, and coordination of care.   Darice Shivers, M.D.   CC: ***       [1]  Current Outpatient Medications on File Prior to Visit  Medication Sig Dispense Refill   albuterol  (PROVENTIL  HFA;VENTOLIN  HFA) 108 (90 Base) MCG/ACT inhaler Inhale 2 puffs into the lungs every 6 (six) hours as needed for wheezing. 1 Inhaler 1   atorvastatin  (LIPITOR) 40 MG tablet TAKE 1 TABLET BY MOUTH EVERY DAY 90 tablet 3   busPIRone  (BUSPAR ) 10 MG tablet Take 10 mg by mouth 3 (three) times daily.     Capsaicin (MUSCLE RELIEF EX) Apply 1 application topically daily as needed (muscle cramps).     clopidogrel  (PLAVIX ) 75 MG tablet Take 75 mg by mouth daily.     diclofenac  Sodium (VOLTAREN ) 1 % GEL Apply 2  g topically 4 (four) times daily. (Patient taking differently: Apply 2 g topically daily as needed (Pain).) 50 g 1   donepezil  (ARICEPT ) 5 MG tablet Take 1 tablet every morning 90 tablet 3   ergocalciferol  (VITAMIN D2) 1.25 MG (50000 UT) capsule Take 50,000 Units by mouth every Wednesday.      folic acid  (FOLVITE ) 800 MCG tablet Take 800 mcg by mouth daily.     gabapentin  (NEURONTIN ) 300 MG capsule Take 300-600 mg by mouth See admin instructions. Take 300 mg morning and noon and 600 mg  at bedtime     hydrochlorothiazide  (HYDRODIURIL ) 25 MG tablet Take 25 mg by mouth every morning.     HYDROcodone -acetaminophen  (NORCO) 10-325 MG tablet Take 1 tablet by mouth every 8 (eight) hours as needed for pain 90 tablet 0   ketoconazole  (NIZORAL ) 2 % cream Apply 1 application topically daily as needed for irritation.     levETIRAcetam  (KEPPRA ) 500 MG tablet Take 1 tablet (500 mg total) by mouth 2 (two) times daily. 180 tablet 3   loratadine  (CLARITIN ) 10 MG tablet Take 10 mg by mouth daily as needed for allergies or rhinitis.     memantine  (NAMENDA ) 10 MG tablet Take 1 tablet every night for 2 weeks, then increase to 1 tablet twice a day and continue (Patient taking differently: Take 10 mg by mouth 2 (two) times daily.) 180 tablet 3   pantoprazole  (PROTONIX ) 40 MG tablet Take 40 mg by mouth at bedtime.     QUEtiapine  (SEROQUEL ) 300 MG tablet Take 1 tablet (300 mg total) by mouth at bedtime. 90 tablet 3   tetrahydrozoline-zinc (VISINE-AC) 0.05-0.25 % ophthalmic solution Place 1-2 drops into both eyes 3 (three) times daily as needed (for irritation).      cyanocobalamin  (VITAMIN B12) 1000 MCG tablet Take 1,000 mcg by mouth daily.     sharps container 1 each by Does not apply route as needed. 1 each 0   SYRINGE-NEEDLE, DISP, 3 ML (B-D 3CC LUER-LOK SYR 25GX1/2) 25G X 1-1/2 3 ML MISC Use with B 12 injection 50 each 0   No current facility-administered medications on file prior to visit.  [2]  Allergies Allergen Reactions   Latex Itching   Molds & Smuts Other (See Comments)    Per allergy test   Pollen Extract Other (See Comments)    Per allergy test   Aspirin  Nausea And Vomiting and Rash

## 2024-03-13 ENCOUNTER — Ambulatory Visit (HOSPITAL_COMMUNITY)
Admission: RE | Admit: 2024-03-13 | Discharge: 2024-03-13 | Disposition: A | Discharge status: Home / Self Care | Source: Ambulatory Visit | Attending: Vascular Surgery | Admitting: Vascular Surgery

## 2024-03-13 ENCOUNTER — Ambulatory Visit (HOSPITAL_BASED_OUTPATIENT_CLINIC_OR_DEPARTMENT_OTHER): Admit: 2024-03-13 | Discharge: 2024-03-13 | Disposition: A | Attending: Vascular Surgery

## 2024-03-13 ENCOUNTER — Ambulatory Visit: Admitting: Physician Assistant

## 2024-03-13 VITALS — BP 137/87 | HR 88 | Temp 97.7°F

## 2024-03-13 DIAGNOSIS — F17200 Nicotine dependence, unspecified, uncomplicated: Secondary | ICD-10-CM | POA: Diagnosis not present

## 2024-03-13 DIAGNOSIS — Z95828 Presence of other vascular implants and grafts: Secondary | ICD-10-CM | POA: Insufficient documentation

## 2024-03-13 DIAGNOSIS — I739 Peripheral vascular disease, unspecified: Secondary | ICD-10-CM | POA: Insufficient documentation

## 2024-03-13 LAB — VAS US ABI WITH/WO TBI
Left ABI: 0.6
Right ABI: 0.93

## 2024-03-13 NOTE — Progress Notes (Signed)
 " HISTORY AND PHYSICAL     CC:  follow up. Requesting Provider:  Juliane Che, PA  HPI: This is a 79 y.o. female who is here today for follow up for PAD.  Pt has hx of harvest of right greater saphenous vein, right common femoral to below knee popliteal artery bypass with non reversed greater saphenous vein by Dr. Gretta on 09/03/20. This was performed due to RLE rest pain.  On 05/07/2021, she underwent angiogram with angioplasty of RLE bypass graft by Dr. Gretta.  On 07/02/2022, she underwent angiogram for concern for high grade stenosis in the proximal bypass graft RLE.  No intervention was performed.  On 02/02/2024, she underwent angiogram for concern of high grade stenosis in the RLE bypass graft and had drug coated balloon angioplasty of right distal common femoral, proximal anastomosis, proximal common femoral to below-knee popliteal vein graft by Dr. Gretta.  The pt returns today for follow up and here with her daughter Heather Horton.  She and her daughter tell me that she has fallen about 4 times over the past month.  Pt states that she feels her knees buckle and she falls.  She has not felt like she has  passed out.  She states she has arthritis in her knees and her left knee swells.  After one of her falls, she developed a sore on the lateral left malleolus.  She rests with that part of her ankle laying on the bed. Her daughter states it has gotten better.   She states she has pain in the toes.  She does smoke but has cut back and continues to work on it.  She states she did quit in the past with Nicorette gum.    The pt is on a statin for cholesterol management.    The pt is not on an aspirin  (allergy).    Other AC:  Plavix  The pt is not on medication for hypertension.  The pt is not on diabetic medication. Tobacco hx:  current   Past Medical History:  Diagnosis Date   Acute encephalopathy    Allergy    Anemia    Anxiety    Arthritis    Asthma    Blood transfusion without reported diagnosis     Bronchitis    Bursitis    Cervical cancer (HCC)    Chronic constipation    Chronic hepatitis C without hepatic coma (HCC) 10/17/2014   11/10/2015 visit with Dr. Efrain. Repeat labs revealed resolution of infection following treatment.  [facility], Maple Coppersmith, [place], FNP-C; last seen 01/2014 Genotype 1b Diagnosed 01/2013   Chronic kidney disease    stage 3   Colon polyps    Family history of adverse reaction to anesthesia    daughter post-op nausea and vomiting   Fibrocystic breast disease    Fibromyalgia    diagnosed years ago per pt   Galactorrhea on right side    GERD (gastroesophageal reflux disease)    Headache    migraines in the past   Hepatitis C    Hyperlipidemia    Hypertension    Insomnia    Neuromuscular disorder (HCC)    Osteoporosis    Respiratory failure (HCC) 03/20/2017   Seizures (HCC)     Past Surgical History:  Procedure Laterality Date   ABDOMINAL AORTOGRAM N/A 02/02/2024   Procedure: ABDOMINAL AORTOGRAM;  Surgeon: Gretta Lonni PARAS, MD;  Location: [facility] INVASIVE CV LAB;  Service: Cardiovascular;  Laterality: N/A;   ABDOMINAL  AORTOGRAM W/LOWER EXTREMITY Right 08/14/2020   Procedure: ABDOMINAL AORTOGRAM W/LOWER EXTREMITY;  Surgeon: Gretta Lonni PARAS, MD;  Location: [facility] INVASIVE CV LAB;  Service: Cardiovascular;  Laterality: Right;   ABDOMINAL AORTOGRAM W/LOWER EXTREMITY N/A 05/07/2021   Procedure: ABDOMINAL AORTOGRAM W/LOWER EXTREMITY;  Surgeon: Gretta Lonni PARAS, MD;  Location: [facility] INVASIVE CV LAB;  Service: Cardiovascular;  Laterality: N/A;   ABDOMINAL AORTOGRAM W/LOWER EXTREMITY N/A 07/29/2022   Procedure: ABDOMINAL AORTOGRAM W/LOWER EXTREMITY;  Surgeon: Gretta Lonni PARAS, MD;  Location: [facility] CV [facility];  Service: Cardiovascular;  Laterality: N/A;   ABDOMINAL HYSTERECTOMY  30's   due to cervical cancer   APPENDECTOMY     BIOPSY  01/29/2020   Procedure: BIOPSY;  Surgeon: Wilhelmenia Aloha Raddle., MD;  Location: [facility]  ENDOSCOPY;  Service: Gastroenterology;;   DILATION AND CURETTAGE OF UTERUS     pt says years ago when living in [place]   ESOPHAGOGASTRODUODENOSCOPY (EGD) WITH PROPOFOL  N/A 01/29/2020   Procedure: ESOPHAGOGASTRODUODENOSCOPY (EGD) WITH PROPOFOL ;  Surgeon: Wilhelmenia Aloha Raddle., MD;  Location: [facility] ENDOSCOPY;  Service: Gastroenterology;  Laterality: N/A;   FEMORAL-POPLITEAL BYPASS GRAFT Right 09/03/2020   Procedure: BYPASS GRAFT FEMORAL-POPLITEAL ARTERY USING NON-REVERSED GREATER SAPHENOUS VEIN RIGHT;  Surgeon: Gretta Lonni PARAS, MD;  Location: [facility] OR;  Service: Vascular;  Laterality: Right;   LOWER EXTREMITY ANGIOGRAPHY N/A 02/02/2024   Procedure: Lower Extremity Angiography;  Surgeon: Gretta Lonni PARAS, MD;  Location: [facility] INVASIVE CV LAB;  Service: Cardiovascular;  Laterality: N/A;   LOWER EXTREMITY INTERVENTION N/A 02/02/2024   Procedure: LOWER EXTREMITY INTERVENTION;  Surgeon: Gretta Lonni PARAS, MD;  Location: [facility] INVASIVE CV LAB;  Service: Cardiovascular;  Laterality: N/A;   PERIPHERAL VASCULAR BALLOON ANGIOPLASTY  05/07/2021   Procedure: PERIPHERAL VASCULAR BALLOON ANGIOPLASTY;  Surgeon: Gretta Lonni PARAS, MD;  Location: [facility] INVASIVE CV LAB;  Service: Cardiovascular;;  Distal Bypass   POLYPECTOMY     SAVORY DILATION N/A 01/29/2020   Procedure: BETTEJANE DILATION;  Surgeon: Wilhelmenia Aloha Raddle., MD;  Location: [facility] ENDOSCOPY;  Service: Gastroenterology;  Laterality: N/A;    Allergies[1]  Current Outpatient Medications  Medication Sig Dispense Refill   albuterol  (PROVENTIL  HFA;VENTOLIN  HFA) 108 (90 Base) MCG/ACT inhaler Inhale 2 puffs into the lungs every 6 (six) hours as needed for wheezing. 1 Inhaler 1   atorvastatin  (LIPITOR) 40 MG tablet TAKE 1 TABLET BY MOUTH EVERY DAY 90 tablet 3   busPIRone  (BUSPAR ) 10 MG tablet Take 10 mg by mouth 3 (three) times daily.     Capsaicin (MUSCLE RELIEF EX) Apply 1 application topically daily as needed (muscle cramps).     clopidogrel  (PLAVIX ) 75 MG  tablet Take 75 mg by mouth daily.     cyanocobalamin  (VITAMIN B12) 1000 MCG tablet Take 1,000 mcg by mouth daily.     diclofenac  Sodium (VOLTAREN ) 1 % GEL Apply 2 g topically 4 (four) times daily. (Patient taking differently: Apply 2 g topically daily as needed (Pain).) 50 g 1   donepezil  (ARICEPT ) 5 MG tablet Take 1 tablet every morning 90 tablet 3   ergocalciferol  (VITAMIN D2) 1.25 MG (50000 UT) capsule Take 50,000 Units by mouth every Wednesday.      folic acid  (FOLVITE ) 800 MCG tablet Take 800 mcg by mouth daily.     gabapentin  (NEURONTIN ) 300 MG capsule Take 300-600 mg by mouth See admin instructions. Take 300 mg morning and noon and 600 mg at bedtime     hydrochlorothiazide  (HYDRODIURIL ) 25 MG tablet Take 25 mg by mouth every morning.  HYDROcodone -acetaminophen  (NORCO) 10-325 MG tablet Take 1 tablet by mouth every 8 (eight) hours as needed for pain 90 tablet 0   ketoconazole  (NIZORAL ) 2 % cream Apply 1 application topically daily as needed for irritation.     levETIRAcetam  (KEPPRA ) 500 MG tablet Take 1 tablet (500 mg total) by mouth 2 (two) times daily. 180 tablet 3   loratadine  (CLARITIN ) 10 MG tablet Take 10 mg by mouth daily as needed for allergies or rhinitis.     memantine  (NAMENDA ) 10 MG tablet Take 1 tablet twice a day 180 tablet 3   pantoprazole  (PROTONIX ) 40 MG tablet Take 40 mg by mouth at bedtime.     QUEtiapine  (SEROQUEL ) 300 MG tablet Take 1 tablet (300 mg total) by mouth at bedtime. 90 tablet 3   sharps container 1 each by Does not apply route as needed. 1 each 0   SYRINGE-NEEDLE, DISP, 3 ML (B-D 3CC LUER-LOK SYR 25GX1/2) 25G X 1-1/2 3 ML MISC Use with B 12 injection 50 each 0   tetrahydrozoline-zinc (VISINE-AC) 0.05-0.25 % ophthalmic solution Place 1-2 drops into both eyes 3 (three) times daily as needed (for irritation).      No current facility-administered medications for this visit.    Family History  Problem Relation Age of Onset   Asthma Mother    Colon cancer  Sister 55   Stroke Brother 15   Stroke Sister 21   Cancer Sister 76       lung cancer, +TOBACCO   Arthritis Daughter    Pulmonary embolism Daughter 4       on chronic warfarin   Hypertension Son 27   Esophageal cancer Neg Hx    Rectal cancer Neg Hx    Stomach cancer Neg Hx     Social History   Socioeconomic History   Marital status: Divorced    Spouse name: Not on file   Number of children: 3   Years of education: 14+   Highest education level: Not on file  Occupational History   Occupation: retired    Comment: phlebotomy, EKG tech; taught both  Tobacco Use   Smoking status: Every Day    Current packs/day: 0.50    Average packs/day: 0.5 packs/day for 40.0 years (20.0 ttl pk-yrs)    Types: Cigarettes    Passive exposure: Never   Smokeless tobacco: Never   Tobacco comments:    I'm scared of e-cigarettes, interested in patches, tobacco info given 01/10/15  Vaping Use   Vaping status: Never Used  Substance and Sexual Activity   Alcohol use: Yes    Alcohol/week: 2.0 standard drinks of alcohol    Types: 2 Glasses of wine per week   Drug use: No   Sexual activity: Not Currently  Other Topics Concern   Not on file  Social History Narrative   Raised in [place], [place].   Moved to [place], [place] and then came to [place] in 2013.   Lives alone.   Daughter lives around the corner.    Raised her niece as her own daughter, and she lives nearby.   Son lives in [place] .   Right handed   Social Drivers of Health   Tobacco Use: High Risk (02/14/2024)   Patient History    Smoking Tobacco Use: Every Day    Smokeless Tobacco Use: Never    Passive Exposure: Never  Financial Resource Strain: Low Risk (04/19/2023)   Received from [facility]   Overall Financial Resource Strain (CARDIA)  Difficulty of Paying Living Expenses: Not hard at all  Food Insecurity: No Food Insecurity (04/19/2023)   Received from [facility]   Epic    Within the past 12 months, you worried that  your food would run out before you got the money to buy more.: Never true    Within the past 12 months, the food you bought just didn't last and you didn't have money to get more.: Never true  Transportation Needs: No Transportation Needs (04/19/2023)   Received from [facility]   PRAPARE - Transportation    Lack of Transportation (Medical): No    Lack of Transportation (Non-Medical): No  Physical Activity: Sufficiently Active (04/19/2023)   Received from [facility]   Exercise Vital Sign    On average, how many days per week do you engage in moderate to strenuous exercise (like a brisk walk)?: 7 days    On average, how many minutes do you engage in exercise at this level?: 30 min  Stress: No Stress Concern Present (04/19/2023)   Received from [facility] - Occupational Stress Questionnaire    Feeling of Stress : Not at all  Social Connections: Socially Integrated (04/19/2023)   Received from [facility]   Social Network    How would you rate your social network (family, work, friends)?: Good participation with social networks  Intimate Partner Violence: Not At Risk (04/16/2022)   Received from [facility]   HITS    Over the last 12 months how often did your partner physically hurt you?: Never    Over the last 12 months how often did your partner insult you or talk down to you?: Never    Over the last 12 months how often did your partner threaten you with physical harm?: Never    Over the last 12 months how often did your partner scream or curse at you?: Never  Depression (PHQ2-9): Not on file  Alcohol Screen: Not on file  Housing: Low Risk (04/19/2023)   Received from [facility]    In the last 12 months, was there a time when you were not able to pay the mortgage or rent on time?: No    In the past 12 months, how many times have you moved where you were living?: 0    At any time in the past 12 months, were you homeless or  living in a shelter (including now)?: No  Utilities: Not At Risk (04/19/2023)   Received from [facility] Utilities    Threatened with loss of utilities: No  Health Literacy: Not on file     REVIEW OF SYSTEMS:   [X]  denotes positive finding, [ ]  denotes negative finding Cardiac  Comments:  Chest pain or chest pressure:    Shortness of breath upon exertion:    Short of breath when lying flat:    Irregular heart rhythm:        Vascular    Pain in calf, thigh, or hip brought on by ambulation:    Pain in feet at night that wakes you up from your sleep:  x See HPI  Blood clot in your veins:    Leg swelling:         Pulmonary    Oxygen at home:    Productive cough:     Wheezing:         Neurologic    Sudden weakness in arms or legs:  Sudden numbness in arms or legs:     Sudden onset of difficulty speaking or slurred speech:    Temporary loss of vision in one eye:     Problems with dizziness:         Gastrointestinal    Blood in stool:     Vomited blood:         Genitourinary    Burning when urinating:     Blood in urine:        Psychiatric    Major depression:         Hematologic    Bleeding problems:    Problems with blood clotting too easily:        Skin    Rashes or ulcers:        Constitutional    Fever or chills:      PHYSICAL EXAMINATION:  Today's Vitals   03/13/24 1152  BP: 137/87  Pulse: 88  Temp: 97.7 F (36.5 C)  TempSrc: Temporal  PainSc: 8   PainLoc: Leg   There is no height or weight on file to calculate BMI.   General:  WDWN in NAD; vital signs documented above Gait: Not observed HENT: WNL, normocephalic Pulmonary: normal non-labored breathing , without wheezing Cardiac: regular HR, without carotid bruits Abdomen: soft, NT; aortic pulse is not palpable Skin: without rashes Vascular Exam/Pulses:  Right Left  Radial 2+ (normal) 2+ (normal)  Femoral 2+ (normal) Unable to palpate  DP Brisk biphasic doppler Monophasic  doppler  PT Brisk biphasic Monophasic doppler   Extremities: without ischemic changes, without Gangrene, without cellulitis; without open wounds Superficial scab left lateral malleolus  Musculoskeletal: no muscle wasting or atrophy  Neurologic: A&O X 3 Psychiatric:  The pt has Normal affect.   Non-Invasive Vascular Imaging:   ABI's/TBI's on 03/13/2024: Right:  0.93/0.51 - Great toe pressure: 86 Left:  0.60/0.62 - Great toe pressure: 103  Arterial duplex on 03/13/2024: Right Graft #1: fem-BK pop  +------------------+--------+---------------+--------+--------+                   PSV cm/sStenosis       WaveformComments  +------------------+--------+---------------+--------+--------+  Inflow           182     30-49% stenosisbiphasic          +------------------+--------+---------------+--------+--------+  Prox Anastomosis  279     50-70% stenosisbiphasic          +------------------+--------+---------------+--------+--------+  Proximal Graft    97                     biphasic          +------------------+--------+---------------+--------+--------+  Mid Graft         54                     biphasic          +------------------+--------+---------------+--------+--------+  Distal Graft      51                     biphasic          +------------------+--------+---------------+--------+--------+  Distal Anastomosis77                     biphasic          +------------------+--------+---------------+--------+--------+  Outflow          84  biphasic          +------------------+--------+---------------+--------+--------+   Summary:  Right: Patent femoropopliteal bypass graft with inflow velocities suggesting a 30-49% stenosis and proximal anastomosis velocities suggesting a 50-70% stenosis.   Mid CFA velocities suggest 50-74% stenosis.   Previous ABI's/TBI's on 12/02/2023: Right:  0.83/0.55 - Great toe pressure: 82 Left:   0.56/0.42 - Great toe pressure:  63  Previous arterial duplex on 12/02/2023: Fem Fem Graft:  +------------------+--------+---------------+--------+--------+                   PSV cm/sStenosis       WaveformComments  +------------------+--------+---------------+--------+--------+  Inflow           241     50-74% stenosisbiphasic          +------------------+--------+---------------+--------+--------+  Prox anastomosis  383     >70% stenosis  biphasic          +------------------+--------+---------------+--------+--------+  Proximal graft    98                     biphasic          +------------------+--------+---------------+--------+--------+  Mid graft         50                     biphasic          +------------------+--------+---------------+--------+--------+  Distal graft      64                     biphasic          +------------------+--------+---------------+--------+--------+  Distal anastomosis131                    biphasic          +------------------+--------+---------------+--------+--------+  Outflow          52                     biphasic          +------------------+--------+---------------+--------+--------+   Patent right Fem-pop BPG with inflow velocities suggesting 50-74%  stenosis. The proximal anastomosis has elevated velocities suggesting >70% stenosis. Elevated velocities are consistent with prior exam done 06/07/23     ASSESSMENT/PLAN:: 79 y.o. female here for follow up for PAD with hx of harvest of right greater saphenous vein, right common femoral to below knee popliteal artery bypass with non reversed greater saphenous vein by Dr. Gretta on 09/03/20. This was performed due to RLE rest pain.  On 05/07/2021, she underwent angiogram with angioplasty of RLE bypass graft by Dr. Gretta.  On 07/02/2022, she underwent angiogram for concern for high grade stenosis in the proximal bypass graft RLE.  No intervention was performed.   On 02/02/2024, she underwent angiogram for concern of high grade stenosis in the RLE bypass graft and had drug coated balloon angioplasty of right distal common femoral, proximal anastomosis, proximal common femoral to below-knee popliteal vein graft by Dr. Gretta.  PAD -pt with mild stenosis but decreased compared to previous study prior to angiogram.  She has brisk doppler flow right foot.   -she did fall and now has a superficial wound on the left lateral malleolus that her daughter states is improving.  On angiogram, she had a high grade left CFA stenosis.  Pt seen with Dr. Gretta and felt that as long as this wound is healing, would not recommend any intervention.  Continue would care-soap and  water daily and protect.  Discussed floating left leg off bed so that her ankle is not laying on the bed.  -continue statin/Plavix  (she is not on asa due to allergy) -pt will f/u in one month to check left ankle and then in 3 months with RLE bypass duplex and ABI (this has been scheduled).  Current smoker Discussed with pt the importance of smoking cessation and that it puts them at increased risk for limb loss, heart attack, stroke, cancers, lung problems.  She has cut back and is working on quitting.     Lucie Apt, [facility] [phone]  Clinic MD:   Gretta     [1]  Allergies Allergen Reactions   Latex Itching   Molds & Smuts Other (See Comments)    Per allergy test   Pollen Extract Other (See Comments)    Per allergy test   Aspirin  Nausea And Vomiting and Rash   "

## 2024-03-14 ENCOUNTER — Other Ambulatory Visit: Payer: Self-pay

## 2024-03-14 DIAGNOSIS — I739 Peripheral vascular disease, unspecified: Secondary | ICD-10-CM

## 2024-04-10 ENCOUNTER — Ambulatory Visit

## 2024-06-26 ENCOUNTER — Ambulatory Visit (HOSPITAL_COMMUNITY)

## 2024-06-26 ENCOUNTER — Ambulatory Visit

## 2024-08-29 ENCOUNTER — Ambulatory Visit: Payer: Self-pay | Admitting: Neurology
